# Patient Record
Sex: Male | Born: 1950 | Race: White | Hispanic: No | Marital: Married | Smoking: Former smoker
Health system: Southern US, Community
[De-identification: ages and names within clinical notes are randomized; demographics above are authoritative.]

## PROBLEM LIST (undated history)

## (undated) DIAGNOSIS — I119 Hypertensive heart disease without heart failure: Secondary | ICD-10-CM

## (undated) DIAGNOSIS — E785 Hyperlipidemia, unspecified: Secondary | ICD-10-CM

## (undated) DIAGNOSIS — F411 Generalized anxiety disorder: Secondary | ICD-10-CM

## (undated) DIAGNOSIS — Z820 Family history of epilepsy and other diseases of the nervous system: Secondary | ICD-10-CM

## (undated) DIAGNOSIS — F0390 Unspecified dementia without behavioral disturbance: Secondary | ICD-10-CM

## (undated) DIAGNOSIS — R55 Syncope and collapse: Secondary | ICD-10-CM

## (undated) DIAGNOSIS — J449 Chronic obstructive pulmonary disease, unspecified: Secondary | ICD-10-CM

## (undated) DIAGNOSIS — I219 Acute myocardial infarction, unspecified: Secondary | ICD-10-CM

## (undated) DIAGNOSIS — M129 Arthropathy, unspecified: Secondary | ICD-10-CM

## (undated) DIAGNOSIS — J45909 Unspecified asthma, uncomplicated: Secondary | ICD-10-CM

## (undated) DIAGNOSIS — I2510 Atherosclerotic heart disease of native coronary artery without angina pectoris: Secondary | ICD-10-CM

## (undated) DIAGNOSIS — N400 Enlarged prostate without lower urinary tract symptoms: Secondary | ICD-10-CM

## (undated) DIAGNOSIS — K449 Diaphragmatic hernia without obstruction or gangrene: Secondary | ICD-10-CM

## (undated) DIAGNOSIS — R0600 Dyspnea, unspecified: Secondary | ICD-10-CM

## (undated) DIAGNOSIS — F32A Depression, unspecified: Secondary | ICD-10-CM

## (undated) DIAGNOSIS — D126 Benign neoplasm of colon, unspecified: Secondary | ICD-10-CM

## (undated) DIAGNOSIS — K5790 Diverticulosis of intestine, part unspecified, without perforation or abscess without bleeding: Secondary | ICD-10-CM

## (undated) DIAGNOSIS — R413 Other amnesia: Secondary | ICD-10-CM

## (undated) DIAGNOSIS — K8020 Calculus of gallbladder without cholecystitis without obstruction: Secondary | ICD-10-CM

## (undated) DIAGNOSIS — K222 Esophageal obstruction: Secondary | ICD-10-CM

## (undated) HISTORY — DX: Other amnesia: R41.3

## (undated) HISTORY — DX: Family history of epilepsy and other diseases of the nervous system: Z82.0

## (undated) HISTORY — DX: Benign prostatic hyperplasia without lower urinary tract symptoms: N40.0

## (undated) HISTORY — DX: Hyperlipidemia, unspecified: E78.5

## (undated) HISTORY — PX: ANGIOPLASTY: SHX39

## (undated) HISTORY — DX: Unspecified asthma, uncomplicated: J45.909

## (undated) HISTORY — DX: Atherosclerotic heart disease of native coronary artery without angina pectoris: I25.10

## (undated) HISTORY — DX: Diaphragmatic hernia without obstruction or gangrene: K44.9

## (undated) HISTORY — DX: Hypertensive heart disease without heart failure: I11.9

## (undated) HISTORY — DX: Diverticulosis of intestine, part unspecified, without perforation or abscess without bleeding: K57.90

## (undated) HISTORY — DX: Arthropathy, unspecified: M12.9

## (undated) HISTORY — PX: ESOPHAGOGASTRODUODENOSCOPY: SHX1529

## (undated) HISTORY — PX: COLONOSCOPY: SHX174

## (undated) HISTORY — DX: Generalized anxiety disorder: F41.1

## (undated) HISTORY — DX: Syncope and collapse: R55

## (undated) HISTORY — PX: ROTATOR CUFF REPAIR: SHX139

## (undated) HISTORY — DX: Benign neoplasm of colon, unspecified: D12.6

## (undated) HISTORY — DX: Esophageal obstruction: K22.2

## (undated) HISTORY — DX: Calculus of gallbladder without cholecystitis without obstruction: K80.20

## (undated) HISTORY — DX: Chronic obstructive pulmonary disease, unspecified: J44.9

---
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
## 1999-03-19 ENCOUNTER — Encounter: Payer: Self-pay | Admitting: Endocrinology

## 1999-03-19 ENCOUNTER — Ambulatory Visit (HOSPITAL_COMMUNITY): Admission: RE | Admit: 1999-03-19 | Discharge: 1999-03-19 | Payer: Self-pay | Admitting: Endocrinology

## 1999-06-02 ENCOUNTER — Encounter (INDEPENDENT_AMBULATORY_CARE_PROVIDER_SITE_OTHER): Payer: Self-pay | Admitting: Specialist

## 1999-06-02 ENCOUNTER — Ambulatory Visit (HOSPITAL_COMMUNITY): Admission: RE | Admit: 1999-06-02 | Discharge: 1999-06-02 | Payer: Self-pay | Admitting: *Deleted

## 2001-07-21 ENCOUNTER — Ambulatory Visit (HOSPITAL_BASED_OUTPATIENT_CLINIC_OR_DEPARTMENT_OTHER): Admission: RE | Admit: 2001-07-21 | Discharge: 2001-07-22 | Payer: Self-pay | Admitting: Orthopedic Surgery

## 2003-01-22 HISTORY — PX: ROTATOR CUFF REPAIR: SHX139

## 2003-01-25 ENCOUNTER — Ambulatory Visit (HOSPITAL_BASED_OUTPATIENT_CLINIC_OR_DEPARTMENT_OTHER): Admission: RE | Admit: 2003-01-25 | Discharge: 2003-01-25 | Payer: Self-pay | Admitting: Orthopedic Surgery

## 2003-11-24 HISTORY — PX: CORONARY ARTERY BYPASS GRAFT: SHX141

## 2004-07-22 ENCOUNTER — Inpatient Hospital Stay (HOSPITAL_COMMUNITY): Admission: EM | Admit: 2004-07-22 | Discharge: 2004-07-28 | Payer: Self-pay | Admitting: Emergency Medicine

## 2004-08-22 HISTORY — PX: CARDIAC CATHETERIZATION: SHX172

## 2004-08-25 ENCOUNTER — Encounter
Admission: RE | Admit: 2004-08-25 | Discharge: 2004-08-25 | Payer: Self-pay | Admitting: Thoracic Surgery (Cardiothoracic Vascular Surgery)

## 2004-09-09 ENCOUNTER — Encounter (HOSPITAL_COMMUNITY): Admission: RE | Admit: 2004-09-09 | Discharge: 2004-12-08 | Payer: Self-pay | Admitting: Cardiovascular Disease

## 2004-11-24 ENCOUNTER — Inpatient Hospital Stay (HOSPITAL_COMMUNITY): Admission: EM | Admit: 2004-11-24 | Discharge: 2004-11-26 | Payer: Self-pay | Admitting: Emergency Medicine

## 2005-02-12 ENCOUNTER — Inpatient Hospital Stay (HOSPITAL_COMMUNITY): Admission: EM | Admit: 2005-02-12 | Discharge: 2005-02-13 | Payer: Self-pay | Admitting: Emergency Medicine

## 2006-07-23 ENCOUNTER — Inpatient Hospital Stay (HOSPITAL_COMMUNITY): Admission: EM | Admit: 2006-07-23 | Discharge: 2006-07-27 | Payer: Self-pay | Admitting: Emergency Medicine

## 2006-08-25 ENCOUNTER — Ambulatory Visit (HOSPITAL_BASED_OUTPATIENT_CLINIC_OR_DEPARTMENT_OTHER): Admission: RE | Admit: 2006-08-25 | Discharge: 2006-08-25 | Payer: Self-pay | Admitting: Orthopedic Surgery

## 2006-11-23 HISTORY — PX: CORONARY ARTERY BYPASS GRAFT: SHX141

## 2007-03-17 ENCOUNTER — Inpatient Hospital Stay (HOSPITAL_COMMUNITY): Admission: EM | Admit: 2007-03-17 | Discharge: 2007-03-18 | Payer: Self-pay | Admitting: Emergency Medicine

## 2007-03-18 ENCOUNTER — Ambulatory Visit: Payer: Self-pay | Admitting: Vascular Surgery

## 2007-08-30 ENCOUNTER — Ambulatory Visit: Payer: Self-pay | Admitting: Gastroenterology

## 2007-09-07 ENCOUNTER — Encounter: Payer: Self-pay | Admitting: Gastroenterology

## 2007-09-07 ENCOUNTER — Ambulatory Visit: Payer: Self-pay | Admitting: Gastroenterology

## 2007-09-07 DIAGNOSIS — K5732 Diverticulitis of large intestine without perforation or abscess without bleeding: Secondary | ICD-10-CM | POA: Insufficient documentation

## 2008-01-18 ENCOUNTER — Observation Stay (HOSPITAL_COMMUNITY): Admission: EM | Admit: 2008-01-18 | Discharge: 2008-01-19 | Payer: Self-pay | Admitting: Emergency Medicine

## 2008-02-02 DIAGNOSIS — R519 Headache, unspecified: Secondary | ICD-10-CM | POA: Insufficient documentation

## 2008-02-02 DIAGNOSIS — I2510 Atherosclerotic heart disease of native coronary artery without angina pectoris: Secondary | ICD-10-CM | POA: Insufficient documentation

## 2008-02-02 DIAGNOSIS — R51 Headache: Secondary | ICD-10-CM

## 2008-02-02 DIAGNOSIS — E7800 Pure hypercholesterolemia, unspecified: Secondary | ICD-10-CM | POA: Insufficient documentation

## 2008-02-02 DIAGNOSIS — J209 Acute bronchitis, unspecified: Secondary | ICD-10-CM

## 2008-02-02 DIAGNOSIS — F411 Generalized anxiety disorder: Secondary | ICD-10-CM | POA: Insufficient documentation

## 2008-12-15 ENCOUNTER — Observation Stay (HOSPITAL_COMMUNITY): Admission: EM | Admit: 2008-12-15 | Discharge: 2008-12-17 | Payer: Self-pay | Admitting: Emergency Medicine

## 2008-12-17 HISTORY — PX: CARDIAC CATHETERIZATION: SHX172

## 2009-02-15 ENCOUNTER — Observation Stay (HOSPITAL_COMMUNITY): Admission: EM | Admit: 2009-02-15 | Discharge: 2009-02-16 | Payer: Self-pay | Admitting: Emergency Medicine

## 2009-02-19 ENCOUNTER — Encounter: Payer: Self-pay | Admitting: Internal Medicine

## 2009-04-04 ENCOUNTER — Emergency Department (HOSPITAL_BASED_OUTPATIENT_CLINIC_OR_DEPARTMENT_OTHER): Admission: EM | Admit: 2009-04-04 | Discharge: 2009-04-04 | Payer: Self-pay | Admitting: Emergency Medicine

## 2009-04-04 ENCOUNTER — Ambulatory Visit: Payer: Self-pay | Admitting: Diagnostic Radiology

## 2009-04-05 ENCOUNTER — Emergency Department (HOSPITAL_COMMUNITY): Admission: EM | Admit: 2009-04-05 | Discharge: 2009-04-05 | Payer: Self-pay | Admitting: Emergency Medicine

## 2009-04-08 DIAGNOSIS — I10 Essential (primary) hypertension: Secondary | ICD-10-CM

## 2009-04-09 ENCOUNTER — Ambulatory Visit: Payer: Self-pay | Admitting: Internal Medicine

## 2009-04-09 DIAGNOSIS — R0602 Shortness of breath: Secondary | ICD-10-CM

## 2009-04-09 DIAGNOSIS — R05 Cough: Secondary | ICD-10-CM

## 2009-04-11 ENCOUNTER — Telehealth: Payer: Self-pay | Admitting: Internal Medicine

## 2009-04-18 ENCOUNTER — Ambulatory Visit (HOSPITAL_COMMUNITY): Admission: RE | Admit: 2009-04-18 | Discharge: 2009-04-18 | Payer: Self-pay | Admitting: Internal Medicine

## 2009-04-30 ENCOUNTER — Ambulatory Visit: Payer: Self-pay | Admitting: Internal Medicine

## 2009-04-30 DIAGNOSIS — J45909 Unspecified asthma, uncomplicated: Secondary | ICD-10-CM | POA: Insufficient documentation

## 2010-02-10 IMAGING — CR DG CHEST 2V
2 series · 2 of 2 positions shown · non-contrast
Comparison: 01/18/2008

CLINICAL DATA: Chest pain.

CHEST - 2 VIEW

[w chest pa]
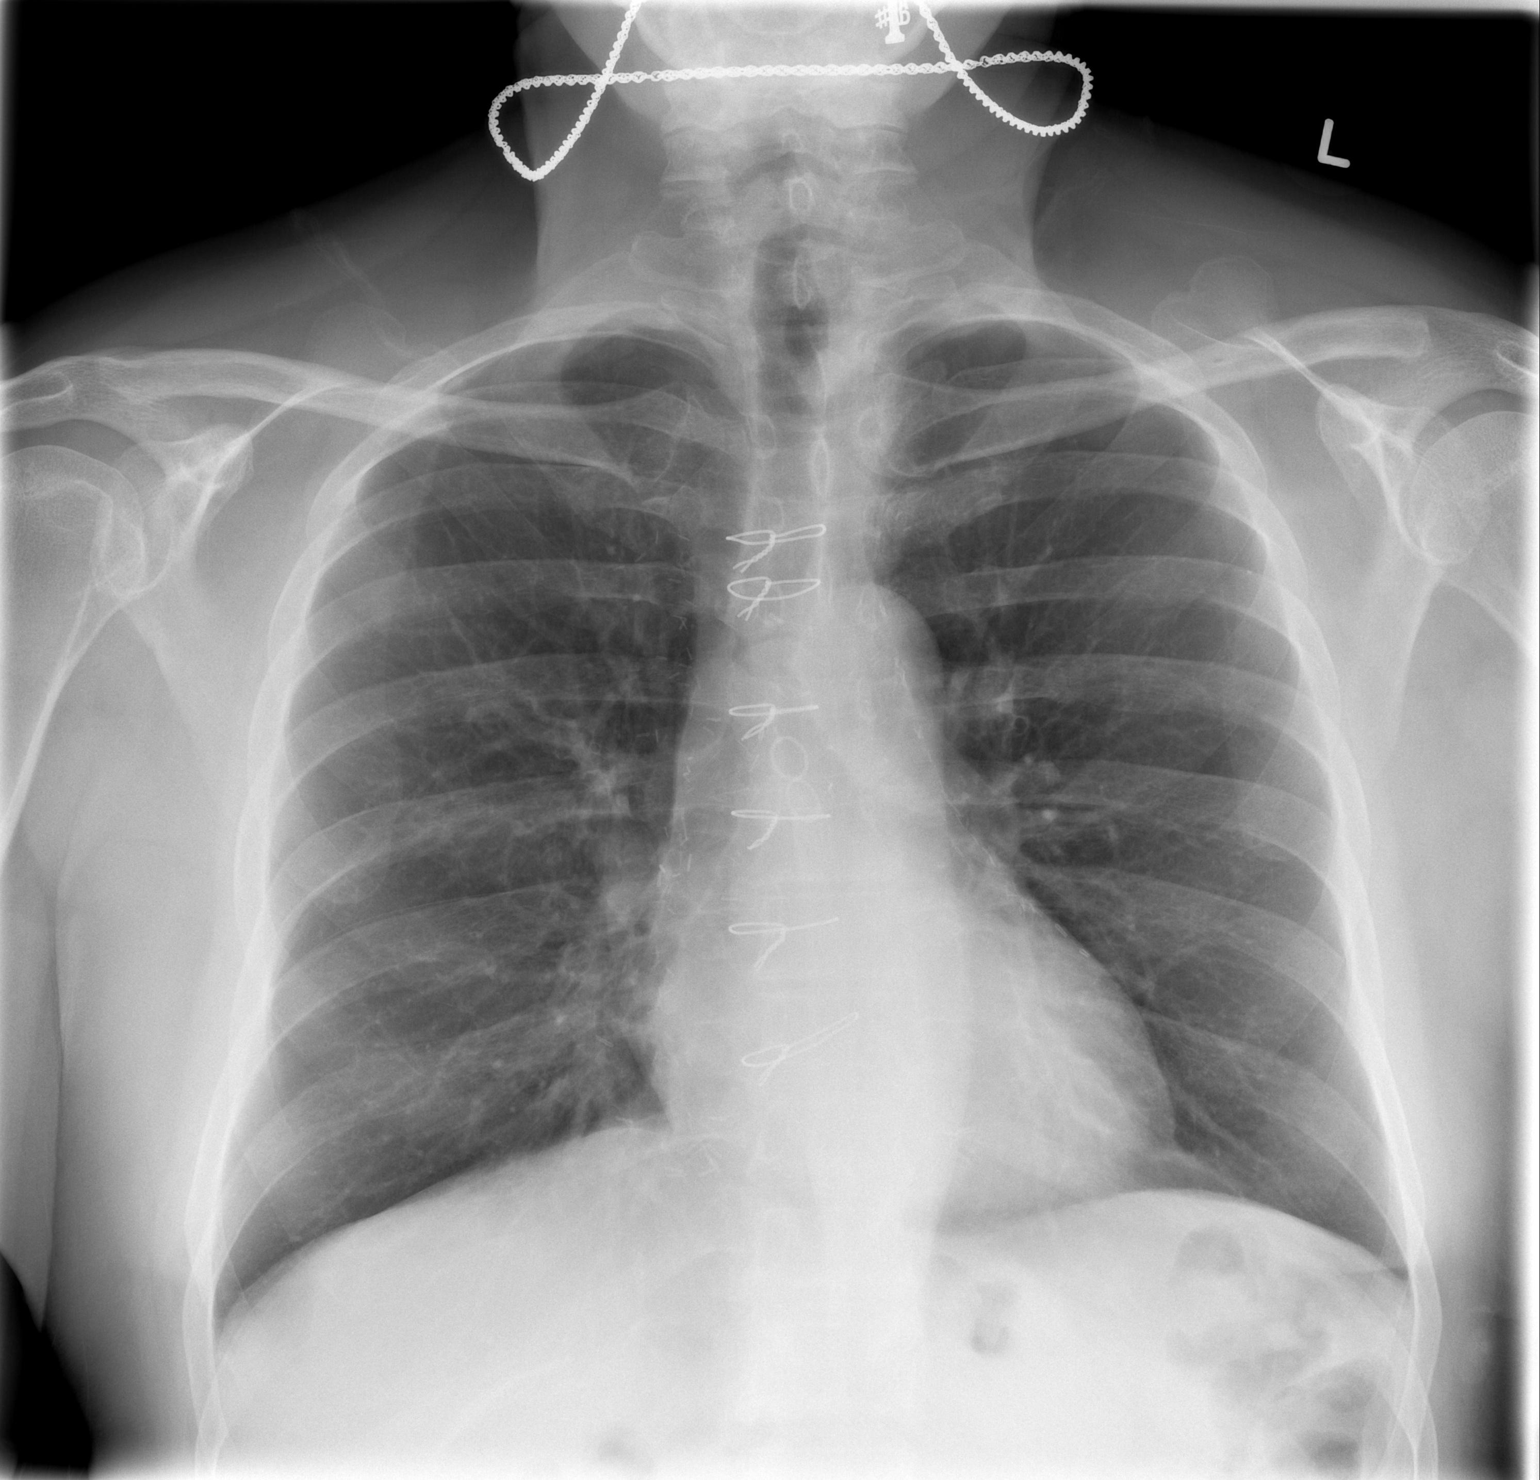

[w chest lat]
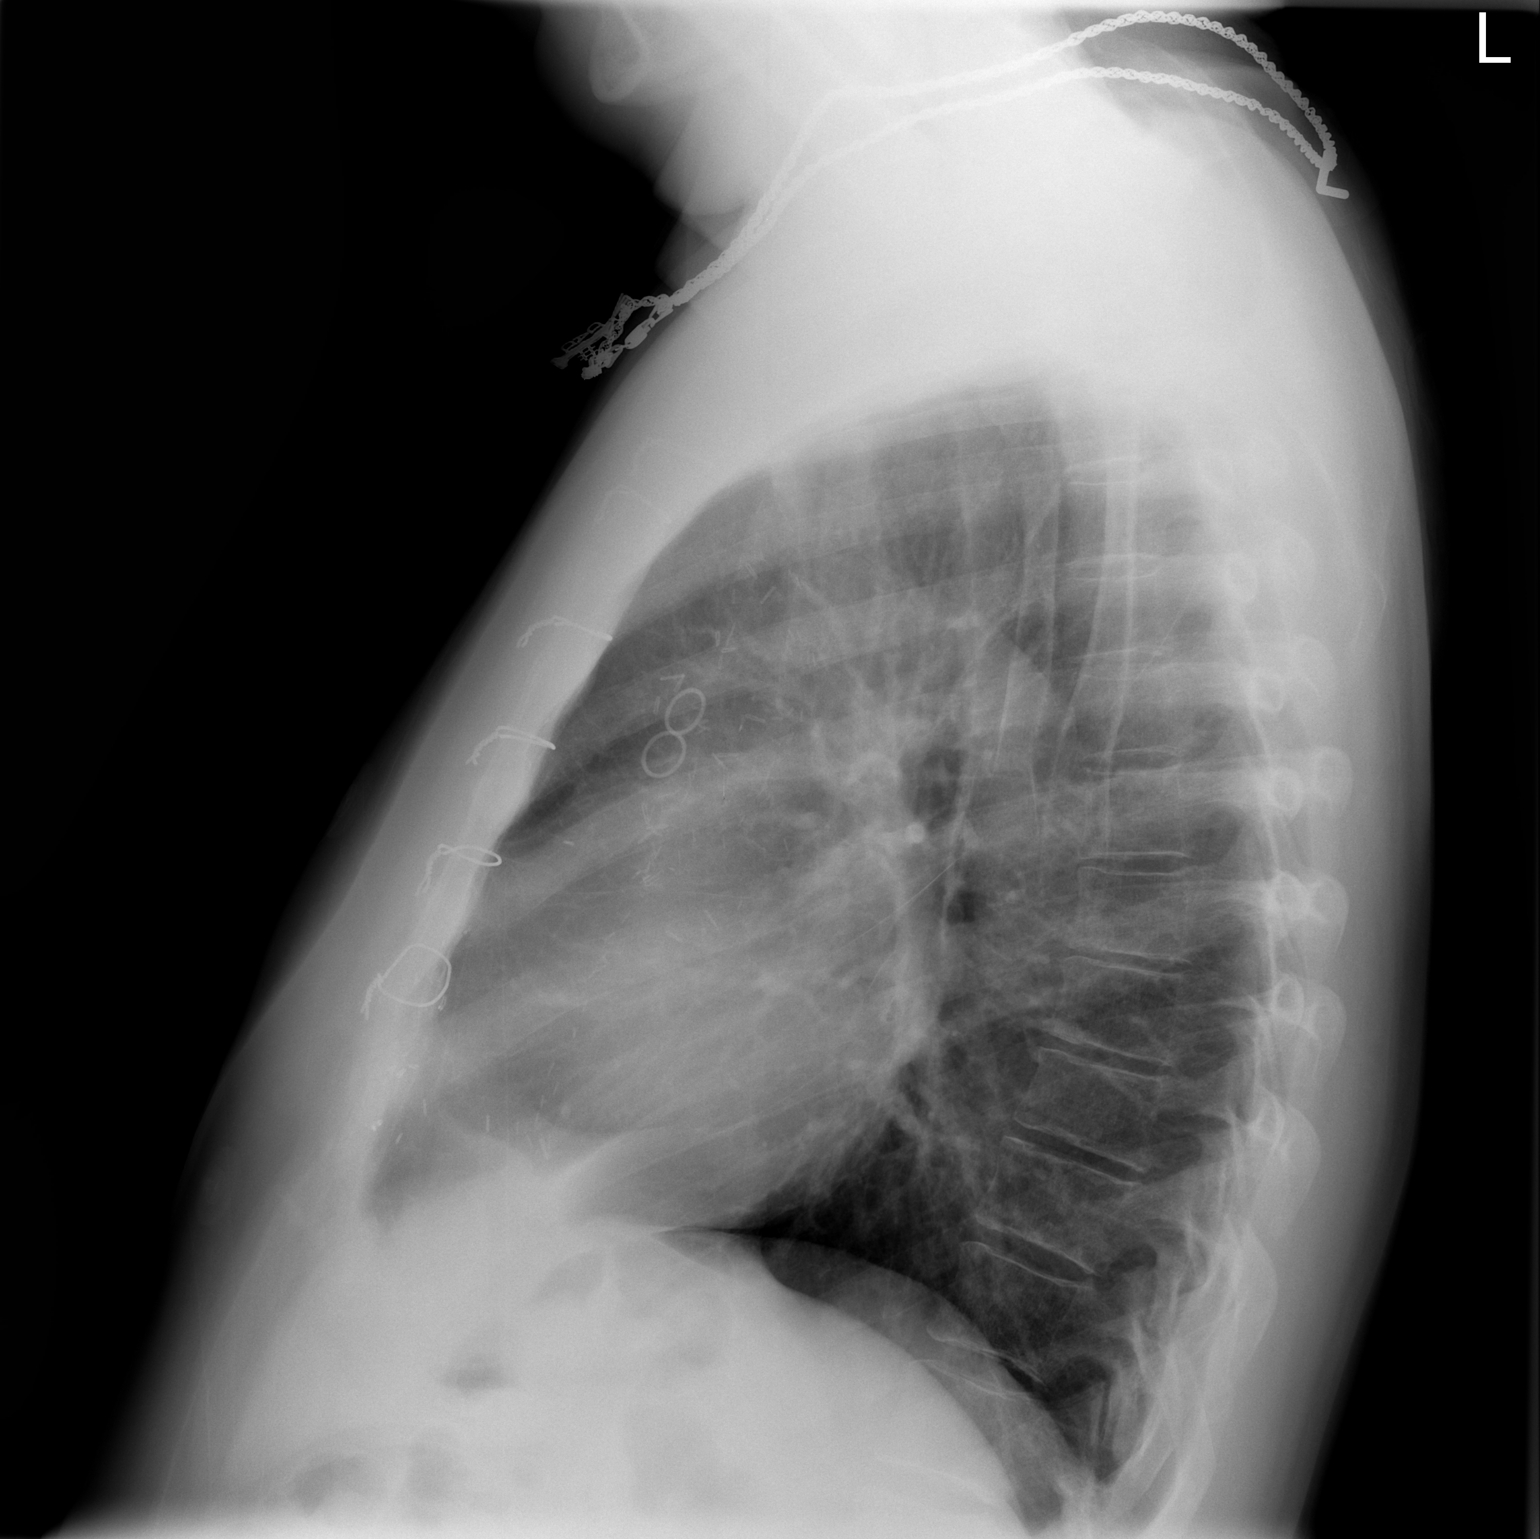

[2 of 2 positions shown; findings below may reference images not displayed]

FINDINGS: The cardiomediastinal silhouette is unremarkable.
Evidence of prior CABG again noted.
The lungs are clear.
There is no evidence of focal airspace disease, pleural effusions,
or pneumothorax.
No acute bony abnormalities are identified.
IMPRESSION: No evidence of acute cardiopulmonary disease.

## 2010-09-26 ENCOUNTER — Emergency Department (HOSPITAL_COMMUNITY): Admission: EM | Admit: 2010-09-26 | Discharge: 2010-09-26 | Payer: Self-pay | Admitting: Emergency Medicine

## 2011-02-03 LAB — TSH: TSH: 0.842 u[IU]/mL (ref 0.350–4.500)

## 2011-02-03 LAB — POCT CARDIAC MARKERS
CKMB, poc: <1 ng/mL — ABNORMAL LOW (ref 1.0–8.0)
CKMB, poc: <1 ng/mL — ABNORMAL LOW (ref 1.0–8.0)
Troponin i, poc: <0.05 ng/mL (ref 0.00–0.09)

## 2011-02-03 LAB — DIFFERENTIAL
Basophils Absolute: 0 10*3/uL (ref 0.0–0.1)
Basophils Relative: 0 % (ref 0–1)
Eosinophils Absolute: 0.1 10*3/uL (ref 0.0–0.7)
Monocytes Absolute: 0.5 10*3/uL (ref 0.1–1.0)
Monocytes Relative: 5 % (ref 3–12)
Neutrophils Relative %: 85 % — ABNORMAL HIGH (ref 43–77)

## 2011-02-03 LAB — COMPREHENSIVE METABOLIC PANEL
ALT: 30 U/L (ref 0–53)
Alkaline Phosphatase: 64 U/L (ref 39–117)
BUN: 8 mg/dL (ref 6–23)
Chloride: 107 mEq/L (ref 96–112)
Glucose, Bld: 122 mg/dL — ABNORMAL HIGH (ref 70–99)
Potassium: 4.3 mEq/L (ref 3.5–5.1)
Sodium: 140 mEq/L (ref 135–145)
Total Bilirubin: 1.4 mg/dL — ABNORMAL HIGH (ref 0.3–1.2)
Total Protein: 7.6 g/dL (ref 6.0–8.3)

## 2011-02-03 LAB — CK TOTAL AND CKMB (NOT AT ARMC): Relative Index: 1 (ref 0.0–2.5)

## 2011-02-03 LAB — MAGNESIUM: Magnesium: 2.2 mg/dL (ref 1.5–2.5)

## 2011-02-03 LAB — TROPONIN I: Troponin I: <0.01 ng/mL (ref 0.00–0.06)

## 2011-02-03 LAB — CBC
HCT: 46.2 % (ref 39.0–52.0)
Hemoglobin: 16.2 g/dL (ref 13.0–17.0)
MCV: 91.5 fL (ref 78.0–100.0)
RBC: 5.05 MIL/uL (ref 4.22–5.81)
RDW: 13.6 % (ref 11.5–15.5)
WBC: 9.4 10*3/uL (ref 4.0–10.5)

## 2011-02-03 LAB — LIPASE, BLOOD: Lipase: 34 U/L (ref 11–59)

## 2011-02-03 LAB — SEDIMENTATION RATE: Sed Rate: 5 mm/hr (ref 0–16)

## 2011-02-03 LAB — AMYLASE: Amylase: 106 U/L — ABNORMAL HIGH (ref 0–105)

## 2011-03-03 LAB — CBC
HCT: 39.8 % (ref 39.0–52.0)
Hemoglobin: 13.9 g/dL (ref 13.0–17.0)
MCHC: 34.9 g/dL (ref 30.0–36.0)
MCHC: 33.8 g/dL (ref 30.0–36.0)
MCV: 92.7 fL (ref 78.0–100.0)
MCV: 93.7 fL (ref 78.0–100.0)
Platelets: 221 10*3/uL (ref 150–400)
RBC: 4.29 MIL/uL (ref 4.22–5.81)
RDW: 12.8 % (ref 11.5–15.5)

## 2011-03-03 LAB — DIFFERENTIAL
Basophils Absolute: 0 10*3/uL (ref 0.0–0.1)
Basophils Absolute: 0 10*3/uL (ref 0.0–0.1)
Basophils Relative: 0 % (ref 0–1)
Basophils Relative: 1 % (ref 0–1)
Eosinophils Absolute: 0.3 10*3/uL (ref 0.0–0.7)
Eosinophils Absolute: 0.5 10*3/uL (ref 0.0–0.7)
Monocytes Absolute: 0.7 10*3/uL (ref 0.1–1.0)
Monocytes Relative: 9 % (ref 3–12)
Neutro Abs: 6.3 10*3/uL (ref 1.7–7.7)
Neutro Abs: 5.8 10*3/uL (ref 1.7–7.7)
Neutrophils Relative %: 75 % (ref 43–77)
Neutrophils Relative %: 70 % (ref 43–77)

## 2011-03-03 LAB — COMPREHENSIVE METABOLIC PANEL
ALT: 23 U/L (ref 0–53)
Albumin: 3.4 g/dL — ABNORMAL LOW (ref 3.5–5.2)
Alkaline Phosphatase: 54 U/L (ref 39–117)
BUN: 10 mg/dL (ref 6–23)
Chloride: 109 mEq/L (ref 96–112)
Glucose, Bld: 108 mg/dL — ABNORMAL HIGH (ref 70–99)
Potassium: 3.9 mEq/L (ref 3.5–5.1)
Sodium: 138 mEq/L (ref 135–145)
Total Bilirubin: 1.1 mg/dL (ref 0.3–1.2)
Total Protein: 6.1 g/dL (ref 6.0–8.3)

## 2011-03-03 LAB — POCT CARDIAC MARKERS
CKMB, poc: 2.8 ng/mL (ref 1.0–8.0)
Myoglobin, poc: 173 ng/mL (ref 12–200)

## 2011-03-03 LAB — CK TOTAL AND CKMB (NOT AT ARMC): CK, MB: 4 ng/mL (ref 0.3–4.0)

## 2011-03-05 LAB — POCT CARDIAC MARKERS: Troponin i, poc: <0.05 ng/mL (ref 0.00–0.09)

## 2011-03-05 LAB — DIFFERENTIAL
Eosinophils Absolute: 0 10*3/uL (ref 0.0–0.7)
Eosinophils Relative: 0 % (ref 0–5)
Lymphs Abs: 0.9 10*3/uL (ref 0.7–4.0)
Monocytes Relative: 1 % — ABNORMAL LOW (ref 3–12)

## 2011-03-05 LAB — COMPREHENSIVE METABOLIC PANEL
AST: 20 U/L (ref 0–37)
BUN: 6 mg/dL (ref 6–23)
CO2: 26 mEq/L (ref 19–32)
Chloride: 103 mEq/L (ref 96–112)
Creatinine, Ser: 0.84 mg/dL (ref 0.4–1.5)
GFR calc non Af Amer: >60 mL/min (ref >60)
Glucose, Bld: 96 mg/dL (ref 70–99)
Total Bilirubin: 0.7 mg/dL (ref 0.3–1.2)

## 2011-03-05 LAB — CBC
HCT: 29.6 % — ABNORMAL LOW (ref 39.0–52.0)
HCT: 45.5 % (ref 39.0–52.0)
Hemoglobin: 15.4 g/dL (ref 13.0–17.0)
MCHC: 33.8 g/dL (ref 30.0–36.0)
Platelets: 114 10*3/uL — ABNORMAL LOW (ref 150–400)
Platelets: 224 10*3/uL (ref 150–400)
RBC: 3.18 MIL/uL — ABNORMAL LOW (ref 4.22–5.81)
RDW: 12.9 % (ref 11.5–15.5)
WBC: 13.9 10*3/uL — ABNORMAL HIGH (ref 4.0–10.5)

## 2011-03-05 LAB — TROPONIN I: Troponin I: 0.42 ng/mL — ABNORMAL HIGH (ref 0.00–0.06)

## 2011-03-05 LAB — HEMOGLOBIN A1C: Mean Plasma Glucose: 94 mg/dL

## 2011-03-05 LAB — BASIC METABOLIC PANEL
BUN: 9 mg/dL (ref 6–23)
Calcium: 8.6 mg/dL (ref 8.4–10.5)
Creatinine, Ser: 0.92 mg/dL (ref 0.4–1.5)
GFR calc non Af Amer: >60 mL/min (ref >60)
Glucose, Bld: 98 mg/dL (ref 70–99)
Potassium: 4.3 mEq/L (ref 3.5–5.1)

## 2011-03-05 LAB — CARDIAC PANEL(CRET KIN+CKTOT+MB+TROPI)
CK, MB: 1.8 ng/mL (ref 0.3–4.0)
Relative Index: 1.3 (ref 0.0–2.5)
Relative Index: 1.5 (ref 0.0–2.5)
Troponin I: <0.01 ng/mL (ref 0.00–0.06)
Troponin I: <0.01 ng/mL (ref 0.00–0.06)

## 2011-03-05 LAB — PROTIME-INR
INR: 1 (ref 0.00–1.49)
Prothrombin Time: 13.1 seconds (ref 11.6–15.2)

## 2011-03-05 LAB — CK TOTAL AND CKMB (NOT AT ARMC)
Relative Index: 3.5 — ABNORMAL HIGH (ref 0.0–2.5)
Total CK: 146 U/L (ref 7–232)

## 2011-03-05 LAB — TSH: TSH: 1.955 u[IU]/mL (ref 0.350–4.500)

## 2011-03-09 LAB — CBC
HCT: 41.4 % (ref 39.0–52.0)
Hemoglobin: 13.9 g/dL (ref 13.0–17.0)
MCHC: 33.6 g/dL (ref 30.0–36.0)
MCHC: 33.8 g/dL (ref 30.0–36.0)
MCV: 94.2 fL (ref 78.0–100.0)
Platelets: 215 10*3/uL (ref 150–400)
Platelets: 225 10*3/uL (ref 150–400)
RBC: 4.4 MIL/uL (ref 4.22–5.81)
RBC: 4.4 MIL/uL (ref 4.22–5.81)
RDW: 13.3 % (ref 11.5–15.5)
WBC: 5.8 10*3/uL (ref 4.0–10.5)

## 2011-03-09 LAB — POCT CARDIAC MARKERS
CKMB, poc: 2.4 ng/mL (ref 1.0–8.0)
Myoglobin, poc: 61.7 ng/mL (ref 12–200)
Troponin i, poc: <0.05 ng/mL (ref 0.00–0.09)

## 2011-03-09 LAB — BASIC METABOLIC PANEL
BUN: 10 mg/dL (ref 6–23)
BUN: 11 mg/dL (ref 6–23)
CO2: 26 mEq/L (ref 19–32)
Calcium: 8.7 mg/dL (ref 8.4–10.5)
Calcium: 9 mg/dL (ref 8.4–10.5)
Calcium: 9.1 mg/dL (ref 8.4–10.5)
Chloride: 106 mEq/L (ref 96–112)
Creatinine, Ser: 0.95 mg/dL (ref 0.4–1.5)
Creatinine, Ser: 0.97 mg/dL (ref 0.4–1.5)
GFR calc Af Amer: >60 mL/min (ref >60)
GFR calc Af Amer: >60 mL/min (ref >60)
GFR calc Af Amer: >60 mL/min (ref >60)
GFR calc non Af Amer: >60 mL/min (ref >60)
GFR calc non Af Amer: >60 mL/min (ref >60)
Glucose, Bld: 105 mg/dL — ABNORMAL HIGH (ref 70–99)
Glucose, Bld: 106 mg/dL — ABNORMAL HIGH (ref 70–99)
Potassium: 4.1 mEq/L (ref 3.5–5.1)
Sodium: 141 mEq/L (ref 135–145)

## 2011-03-09 LAB — PROTIME-INR
INR: 1 (ref 0.00–1.49)
Prothrombin Time: 13.6 seconds (ref 11.6–15.2)
Prothrombin Time: 14.4 seconds (ref 11.6–15.2)

## 2011-03-09 LAB — LIPID PANEL
LDL Cholesterol: 66 mg/dL (ref 0–99)
Total CHOL/HDL Ratio: 2.9 RATIO
Triglycerides: 40 mg/dL (ref <150)
VLDL: 8 mg/dL (ref 0–40)

## 2011-03-09 LAB — CARDIAC PANEL(CRET KIN+CKTOT+MB+TROPI): Relative Index: 2.1 (ref 0.0–2.5)

## 2011-03-09 LAB — TROPONIN I: Troponin I: <0.01 ng/mL (ref 0.00–0.06)

## 2011-03-09 LAB — CK TOTAL AND CKMB (NOT AT ARMC): Relative Index: 1.8 (ref 0.0–2.5)

## 2011-03-09 LAB — APTT
aPTT: 42 seconds — ABNORMAL HIGH (ref 24–37)
aPTT: 47 seconds — ABNORMAL HIGH (ref 24–37)

## 2011-04-07 NOTE — Assessment & Plan Note (Signed)
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 Juleen China for evaluation of abdominal pain, gas,  bloating, and bowel irregularities.   Jose Leonard has a long history of alternating diarrhea and  constipation, gas, bloating, but denies rectal bleeding at this time,  anorexia, or weight loss, or any upper GI or hepatobiliary complaints.  He had previous colonoscopy by Dr. Sabino Gasser in July of 2000, at which  time he had guaiac positive stools.  He apparently had ulcerations in  his ileum and had mild ileitis, but was not treated.  He also had  endoscopy, which showed Helicobacter gastritis, which was not treated.  The patient, in the past, has had acid reflux symptoms, but currently  denies any upper GI or hepatobiliary complaints.  In 2005, he thought he  had a bad episode of acid reflux and ended up having an MI and underwent  cardiac bypass surgery.  He is followed by Dr. Tresa Endo and is on Plavix  and aspirin.   He denies melena, hematochezia, or any specific food intolerances.  He  is on a very low-fat diet and controls his weight closely.  He does not  smoke or abuse NSAID or alcohol.  He denies right lower quadrant pain,  fever, chills, skin rashes, joint pains, oral stomatitis, or any  musculoskeletal problems.  He has not had any recent GI x-rays.   PAST MEDICAL HISTORY:  Besides his hypercholesterolemia, coronary artery  disease, remarkable for asthmatic bronchitis, history of anxiety  disorder, chronic headaches, and several rotator cuff surgeries.   MEDICATIONS:  1. Niaspan 1000 mg at bedtime.  2. Altace 5 mg a day.  3. Vytorin 10/40 at bedtime.  4. Metoprolol 25 mg at bedtime.  5. Isorbid  90 mg a day.  6. Plavix 75 mg a day.  7. Aspirin 81 mg a day.  8. Multivitamins daily.  9. Fish oil 2400 mg a day.  10.Ranexa 500 mg a day.  11.He also uses p.r.n. Advil inhalers.   In the past has had shortness of breath and a rash with PENICILLIN use.   FAMILY HISTORY:  Remarkable for his mother having colon cancer in her  mid 48s.  Remarkable for diabetes, atherosclerosis, and cardiac disease.   SOCIAL HISTORY:  The patient is married and lives with his wife with a  high school education and was in the [employer].  He works as a Dispensing optician.  Denies any smoking history.  Uses ethanol socially.   REVIEW OF SYSTEMS:  The patient does have chest pain with exertion, and  uses p.r.n. nitroglycerin.  He describes a dry, nonproductive cough,  various arthralgias, chronic insomnia, periodic cardiac palpitations,  chronic back pain, chronic fatigue, shortness of breath with too much  exertion, but says he can walk several blocks at a time.  He denies any  current neurological, psychiatric, urologic, or other endocrine  problems.  Review of systems is otherwise noncontributory.   EXAMINATION:  He is a healthy-appearing white male appearing his stated  age in no acute distress.  He is 5 feet 8-1/2 inches tall and weighs 188 pounds.  Blood pressure  126/72, pulse 76 and regular.  I could not appreciate stigmata of chronic liver disease or thyromegaly.  His chest had scattered wheezes in both lung fields, but no rales.  There were no areas of consolidation on percussion.  He seemed to be in a regular rhythm without significant murmurs,  gallops, or rubs.  There was no hepatosplenomegaly, abdominal masses, or tenderness.  Bowel  sounds were normal.  Inspection of the rectum was unremarkable, as was rectal exam.  There  were no rectal masses or tenderness.  There was formed stool present,  which was guaiac negative.  There was no peripheral edema, phlebitis, or  swollen joints.  His mental  status was normal.   ASSESSMENT:  1. Chronic irritable bowel syndrome with a vague history of possible      ileitis per previous colonoscopy.  This is somewhat unclear since      the patient has no symptoms suggestive of Crohn's disease, and      never was treated for such.  2. Strong family history of colon carcinoma at an early age.  3. Hypertensive cardiovascular disease with hyperlipidemia and      coronary artery bypass surgery.  4. Chronic anticoagulation with Plavix and aspirin.  5. Long history of asthmatic bronchitis.  6. Multiple functional complaints, including chronic insomnia,      fatigue, arthralgias, et Karie Soda.  He does have a history of      anxiety syndrome.   RECOMMENDATIONS:  1. Outpatient colonoscopy at his convenience.  2. Will hold aspirin 7 days before his procedure and Plavix 5 days      before his procedure, unless advised otherwise by Drs. Kohut or      [employer].  The risks and benefits of this procedure on and off      anticoagulation have been explained to the patient in detail, and      he has agreed to proceed as planned.  He has related that in the      past he has held these medications for surgical procedures.  3. Continue all other medications per Drs. Kohut and [employer].  4. High fiber diet with daily Citrucel and liberal p.o. fluids.  5. Consider inflammatory bowel disease serologic markers depending on      clinical workup.     Vania Rea. Jarold Motto, MD, Caleen Essex, FAGA  Electronically Signed    DRP/MedQ  DD: 08/30/2007  DT: 08/30/2007  Job #: [id number]   cc:   Brooke Bonito, M.D.  Nicki Guadalajara, M.D.

## 2011-04-07 NOTE — Discharge Summary (Signed)
NAME:  Jose Leonard, Jose Leonard NO.:  [id number]   MEDICAL RECORD NO.:  [id number]          PATIENT TYPE:  INP   LOCATION:  2852                         FACILITY:  [facility]   PHYSICIAN:  Nicki Guadalajara, M.D.     DATE OF BIRTH:  04/25/1951   DATE OF ADMISSION:  12/14/2008  DATE OF DISCHARGE:                               DISCHARGE SUMMARY   DISCHARGE DIAGNOSES:  1. Chest pain, not coronary ischemia status post cardiac      catheterization, December 17, 2008 with grafts patent, atretic, and      occluded right internal mammary artery and an atretic left internal      mammary artery with medical treatment decision possibly      gastroesophageal reflux disease or musculoskeletal related chest      pain or increased stress.  His Imdur was increased and he was      placed on Protonix.  2. Coronary artery disease status post coronary bypass grafting,      August 2005.  3. History of enhanced external counterpulsation in the past.  4. Dyslipidemia.  5. Hypertension.  6. Borderline diabetes mellitus.  7. Bilateral rotator cuff repairs.  8. Gastroesophageal reflux disease.  9. Increased stress related to finances and deaths in the family.   DISCHARGE MEDICATIONS:  1. Imdur 90 mg a day.  2. Aspirin 81 mg a day.  3. Niaspan 1000 mg at bedtime.  4. Altace 5 mg a day.  5. Vytorin 10/40 one daily at night.  6. Metoprolol succinate 25 mg a day.  7. Plavix 75 mg a day.  8. Multivitamin daily.  9. Omega-3 fish oil capsules 2 twice per day.  10.Ranexa 500 mg a day.  11.Protonix 40 mg daily.   LABORATORY DATA:  His CK-MBs and troponins were all negative.  His  hemoglobin was 14.3, hematocrit 42.2, WBC 6.6, and platelets 225.  Sodium 140, potassium 4.4, chloride 108, CO2 27, BUN 11, creatinine  0.95, and glucose 105.   HOSPITAL COURSE:  Mr. Shackleton is a patient of Dr. Landry Dyke.  He came on  the evening of December 15, 2008 with complaints of chest pain and  shoulder pain.  He was seen  at 3:00 a.m. in the morning and seen by Dr.  Tresa Endo.  It was decided to admit him and he was set up for possible  cardiac catheterization.  His CK-MBs and troponins came back negative.  He was weaned off his nitroglycerin on December 16, 2008 and he was  changed Imdur at a higher dose from home.  He underwent cardiac  catheterization on December 17, 2008.  He had no stenosis that could be  intervened on and Dr. Tresa Endo decided to send him home for medical  treatment.   OTHER DISCHARGE INSTRUCTIONS:  He should do no strenuous activity,  lifting, pushing, pulling or extended walking x1 week.  He should call  us if he has any problems.  He should be out of work for 3-4 days.      Lezlie Octave, N.P.    ______________________________  Nicki Guadalajara, M.D.    BB/MEDQ  D:  12/17/2008  T:  12/18/2008  Job:  [id number]   cc:   Brooke Bonito, M.D.

## 2011-04-07 NOTE — H&P (Signed)
NAME:  Jose Leonard, Jose Leonard NO.:  [id number]   MEDICAL RECORD NO.:  [id number]          PATIENT TYPE:  INP   LOCATION:  1829                         FACILITY:  [facility]   PHYSICIAN:  Nicki Guadalajara, M.D.     DATE OF BIRTH:  1951-11-13   DATE OF ADMISSION:  02/15/2009  DATE OF DISCHARGE:                              HISTORY & PHYSICAL   CHIEF COMPLAINT:  Left-sided chest pain.   HISTORY OF PRESENT ILLNESS:  A 60 year old white married male with a  history of bypass grafting in 2005 with LIMA to the LAD, a RIMA to the  RCA and vein graft to the diagonal and vein graft to the marginal.  He  has documented atresia both arterial grafts, unfortunately.  He has had  chronic angina and has benefitted from ECP therapy as well as Ranexa.  He is currently under a lot of stress.  He also has shoulder pain that  has been injected by Dr. Richardson Landry but did not help his symptomatology  very much.  In January of 2010 he presented with chest pain, underwent  cardiac catheterization and he had patent vein grafts and atretic  arterial conduits, LV function was normal.  He did have mid distal  inferior focal hypercontractility.   The patient continues with pain every day that he just ignores most of  the time.  Yesterday he had chest pain, then last night he had severe  chest pain.  He sat up most of the night, he was also very short of  breath, he had dyspnea on exertion but even at rest he was short of  breath.  He did take 3 nitroglycerin last evening without relief of pain  so his wife brought him to the emergency room today.  Here, now that he  has got nitro paste on, his pain is down to 1-2/10, he describes it as a  sharp and then dull pain, kind of sharp, dull, sharp, dull.  Now,  currently, it just feels bruised in the chest.   PAST MEDICAL HISTORY:  1. Coronary disease with history of bypass grafting.  2. Shoulder pain, as stated previously.  3. Dyslipidemia.  4. Hypertension.  5. Borderline diabetes mellitus.  6. Bilateral rotator cuff repairs.  7. Gastroesophageal reflux disease.  8. Increased stress related to finances and deaths in the family.   OUTPATIENT MEDICATIONS:  1. Imdur 60 mg daily.  2. Aspirin 81 daily.  3. Niaspan 1000 mg at hour of sleep.  4. Altace 5 mg daily.  5. Vytorin 10/40 daily.  6. Metoprolol succinate 25 mg daily.  7. Plavix 75 mg daily.  8. Multivitamin daily.  9. Omega 3 Fish Oil caps 2 twice per day.  10.Ranexa 500 mg daily.  11.Protonix 40 mg daily.   Here in the emergency room labs were stable.  Chest x-ray with no active  disease.  Currently stable electrocardiogram without acute changes.   FAMILY HISTORY:  Not significant to this admission.   SOCIAL HISTORY:  Married, 3 children, 3 grandchildren.  Does not use  tobacco.  No significant alcohol.  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   ALLERGIES:  PENICILLIN.   REVIEW OF SYSTEMS:  GENERAL:  He does have a cough, yellow-brown mucus,  which he feels is more sinus-related.  SKIN:  Negative for rashes.  MUSCULOSKELETAL:  Right leg numbness which is chronic.  NEURO:  No  syncope.  No lightheadedness.  GI:  No diarrhea, constipation or melena.  GU:  No hematuria or dysuria.  ENDOCRINE:  Borderline diabetic.  No  thyroid disease.  GU:  No hematuria or dysuria.   PHYSICAL EXAM:  Blood pressure 125/68, respiration 16, pulse 65,  temperature is slightly elevated at 99.6, oxygen saturation in room air  98%.  GENERAL:  An alert, oriented white male in no acute distress.  Glasses  in place.  HEENT:  Normocephalic, glasses in place.  Sclera clear.  NECK:  Supple.  No JVD, no bruits, no thyromegaly.  CHEST:  Clear to auscultation but does have chest wall tenderness to  palpation in the left anterior spaces.  HEART:  S1-S2, regular rate and rhythm, without obvious murmur, gallop,  rub or click.  ABDOMEN:  Obese, soft,  nontender, positive bowel sounds.  Do not palpate  liver, spleen or masses.  LOWER EXTREMITIES:  Without edema.  Pedal pulses are present.  SKIN:  Without rashes.  NEURO:  Alert and oriented x3.  Follows commands.  Moves all  extremities.  Affect pleasant, though frustration due to continued  episodic chest pain.   LABS:  Myoglobin 76, CK-MB less than 1, troponin I less than 0.05.  Sodium 134, potassium 4.2, BUN 6, creatinine 0.89, glucose 96.  LFTs  were normal.  INR is 1.  Chest x-ray no active disease.   IMPRESSION:  1. Recurrent chest pain, sounds more or appears more musculoskeletal      but with extreme shortness of breath, rule out pulmonary embolism,      rule out myocardial infarction.  2. Upper respiratory infection.  Will add Mucinex and cough syrup and      nasal spray.  3. Coronary disease with a history of bypass grafting with arterial      conduits atretic.   PLAN OF ACTION:  1. IV Toradol stat and CT angiogram to rule out PE and to evaluate      fractured ribs.  2. Admit to a telemetry bed for overnight obs.  3. I will go ahead and give him full-dose Lovenox tonight.  4. I have written for the Toradol around the clock and Vicodin p.r.n.      as well as morphine p.r.n. and nitroglycerin sublingual.   Dr. Tresa Endo saw the patient and assessed him with me.  We will monitor.  Hopefully he can be discharged in the morning and hopefully his symptoms  will be improved.      Darcella Gasman. Annie Paras, N.P.    ______________________________  Nicki Guadalajara, M.D.    LRI/MEDQ  D:  02/15/2009  T:  02/15/2009  Job:  [id number]

## 2011-04-07 NOTE — Cardiovascular Report (Signed)
NAME:  Jose Leonard, Jose Leonard NO.:  [id number]   MEDICAL RECORD NO.:  [id number]           PATIENT TYPE:   LOCATION:                                 FACILITY:   PHYSICIAN:  Nicki Guadalajara, M.D.     DATE OF BIRTH:  1951-11-19   DATE OF PROCEDURE:  DATE OF DISCHARGE:                            CARDIAC CATHETERIZATION   INDICATIONS:  Jose Leonard is a pleasant 60 year old gentleman who  has known coronary artery disease.  In August 2005, he underwent LIMA to  his LAD, RIMA graft to his RCA, vein graft to his diagonal, and a vein  graft to his marginal vessel.  He has been documented to have atresia of  both arterial conduits.  He has had chronic angina.  Recently, he has  been under increased stress.  He has noticed recurrent chest pain.  Ultimately, he was admitted to [facility] on December 17, 2008,  with recurrent chest pain.  His cardiac enzymes were negative.  Definitive repeat catheterization was recommended.   PROCEDURE:  After premedication with Versed 2 mg intravenously, the  patient was prepped and draped in the usual fashion.  His right femoral  artery was punctured anteriorly and a 5-French sheath was inserted.  Diagnostic cardiac catheterization was done utilizing 5-French Judkins  #4 left and right coronary catheters.  The right catheter was used for  selective angiography into the vein graft.  The native right coronary  artery was not able to be cannulated with the RCA catheter and since  this native RCA arose from the left cusp just inferior to the left main,  an AL-1 catheter was necessary for selective angiography into the right  coronary artery.  A LIMA catheter was used for selective angiography  into the right and left subclavian system.  Hemostasis was obtained by  direct manual pressure.  The patient tolerated the procedure well.   HEMODYNAMIC DATA:  Central aortic pressure is 109/55.  Left ventricular  pressure is 109/12.   ANGIOGRAPHIC DATA:  Left main coronary artery was angiographically  normal and bifurcated into the LAD and left circumflex system.   The LAD itself appeared free of significant disease.  The first diagonal  vessel was occluded.  There was improvement in prior native CAD from  previous catheterization.   The circumflex vessel had 40-50% proximal narrowing.  The OM-1 vessel  was not well visualized and this was bypassed with the vein graft.  The  distal circumflex gave rise to 2 additional marginal vessels and was  free of significant disease.   The right coronary artery arose anomalously off the left coronary cusp.  There was now smooth 50% narrowing which actually had improved from  previously.  The RCA supplied a PDA and posterolateral vessel.   The right internal mammary artery was totally occluded and was not  visualized from the right subclavian system.   The left internal mammary artery was atretic and did not reach the LAD.   Biplane cine left ventriculography revealed preserved global  contractility with an ejection fraction of at least 55%.  On the  RAO  projection, there was a subtle suggestion of minimal mid distal  hypocontractility.  On the LAO, injection contractility appeared normal.   IMPRESSION:  1. Native coronary artery disease with improvement in the prior native      left anterior descending anatomy from prior to bypass, but with      diffuse high-grade stenosis in the first diagonal branch.  2. 40-50% circumflex narrowing with diffuse narrowing proximally of      the first obtuse marginal artery vessel.  3. 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  4. Patent vein graft to the obtuse marginal one vessel of the left      circumflex coronary artery.  5. Patent vein graft to diagonal branch of the left anterior      descending.  6. Old atretic left internal mammary artery graft  which no longer      reaches the left anterior descending.  7. Occluded right internal mammary artery graft at its origin.   RECOMMENDATIONS:  Medical therapy.           ______________________________  Nicki Guadalajara, M.D.     TK/MEDQ  D:  12/17/2008  T:  12/18/2008  Job:  [id number]

## 2011-04-10 NOTE — Op Note (Signed)
NAME:  Jose Leonard, Jose Leonard                      ACCOUNT NO.:  [id number]   MEDICAL RECORD NO.:  [id number]                   PATIENT TYPE:  AMB   LOCATION:  DSC                                  FACILITY:  [facility]   PHYSICIAN:  Loreta Ave, M.D.              DATE OF BIRTH:  [birth year]   DATE OF PROCEDURE:  01/25/2003  DATE OF DISCHARGE:                                 OPERATIVE REPORT   PREOPERATIVE DIAGNOSIS:  Recurrent partial versus complete rotator cuff  tear, left shoulder, with recurrent impingement and calcification, AC  interval.   POSTOPERATIVE DIAGNOSIS:  Recurrent partial rotator cuff tear, left  shoulder, with recurrent impingement and calcification, AC interval.   PROCEDURE:  1. Left shoulder exam under anesthesia.  2. Arthroscopy.  3. Debridement of rotator cuff above and below.  4. Bursectomy.  5. Revision anterior acromioplasty.  6. Excision recurrent calcification, bony fragments, AC interval.   SURGEON:  Loreta Ave, M.D.   ASSISTANT:  Arlys John D. Petrarca, P.A.-C.   ANESTHESIA:  General.   ESTIMATED BLOOD LOSS:  Minimal.   SPECIMENS:  None.   CULTURES:  None.   COMPLICATIONS:  None.   DRESSINGS:  Soft, compressive with sling.   PROCEDURE IN DETAIL:  The patient was brought to the operating room and  placed on the operating table in supine position.  After adequate anesthesia  had been obtained, the left shoulder was examined.  Full motion, good  stability.  Patient placed in beachchair position on the shoulder positioner  and prepped and draped in the usual sterile fashion.  Three standard  arthroscopic portals, anterior, posterior, lateral.  Shoulder entered.  Distended and inspected.  Partial-thickness tearing, undersurface of the  cuff, but the previous repair was still intact.  Biceps tendon, biceps  anchor, labrum, articular cartilage, capsule and ligamentous structures all  intact.  The camera was then redirected subacromially.  More  significant  abrasive tearing on the top of the cuff, thoroughly debrided.  Recurrent  bursitis debrided.  Although he had a previous decompression, this did look  good except for a little bit of recurrent spurring on the front of the  acromion.  Created with revision acromioplasty and re-releasing of ligament  there.  AC interval was exposed.  Adequate excision distal clavicle, but  there was some calcification post-traumatic in that interval.  All of that  was resected, and the clavicle was brought back another few millimeters to  give more clearance.  Care was taken to stay lateral to the coracoclavicular  ligament.  Adequacy of decompression and thorough assessment of the cuff  viewing it from all portals.  Instruments and fluid were removed.  Portals  injected with Marcaine.  Portals closed with 4-0 nylon.  Sterile compressive  dressing applied.  Anesthesia reversed.  Brought to recovery room.  Tolerated surgery well, no complications.  Loreta Ave, M.D.    DFM/MEDQ  D:  01/26/2003  T:  01/26/2003  Job:  [id number]

## 2011-04-10 NOTE — Cardiovascular Report (Signed)
NAME:  Jose Leonard, Jose Leonard                      ACCOUNT NO.:  [id number]   MEDICAL RECORD NO.:  [id number]                   PATIENT TYPE:  INP   LOCATION:  2315                                 FACILITY:  [facility]   PHYSICIAN:  Darlin Priestly, M.D.             DATE OF BIRTH:  [birth year]   DATE OF PROCEDURE:  07/23/2004  DATE OF DISCHARGE:                              CARDIAC CATHETERIZATION   PROCEDURES PERFORMED:  1.  Left heart catheterization.  2.  Coronary angiography.  3.  Left ventriculogram.   ATTENDING:  Darlin Priestly, M.D.   COMPLICATIONS:  None.   INDICATIONS:  Mr. Himes is a 60 year old male patient of Dr. Juleen China who  has a history of asthma and BPH who began complaining of substernal chest  pain on July 22, 2004 while at work.  He was subsequently admitted where  he did rule in for non Q-wave infarction.  He is now referred for cardiac  catheterization to define his coronary anatomy.   DESCRIPTION OF OPERATION:  After giving informed written consent, patient  brought to the cardiac catheterization laboratory.  Right and left groin  shaved, prepped and draped in usual sterile fashion.  ECG monitor  established.  Using a modified Seldinger technique, a number 6-French  arterial sheath inserted in right femoral artery.  A 6-French diagnostic  catheter was then used to perform diagnostic angiography.   Left main is a large vessel with no significant disease.   The LAD is a large vessel which courses to the apex and gives rise to one  diagonal branch.  The LAD is noted to have 60% early mid stenosis after the  takeoff of the first diagonal.   The first diagonal is a large vessel which comes off at a greater than 90  degree angle, courses anteriorly, and then turns in a downward fashion.  There is noted to be a long 99% lesion in the early proximal portion of the  vessel.   The left circumflex is a large vessel coursing the AV groove and gives rise  to two  obtuse marginal branches.  The AV groove circumflex has a 40%  narrowing in its proximal portion.  The remainder of the AV circumflex has  no significant disease.   The first OM is a large vessel which bifurcates in its distal segment.  The  first OM appears to have a 60-70% ostial lesion.  There is 50% lesion in the  mid portion of the OM.  The lower branches are irregular, but have no high  grade stenosis.   The second OM is a small vessel with no significant disease.   The right coronary artery is a dominant vessel and appears to originate from  the left coronary cusp and shares a very close ostium with the left main.  This is nonselectively engaged with an AL2 catheter.  There appears to be a  70% proximal RCA lesion.  The remainder of the RCA, PDA, and posterolateral  branch have no significant disease.   Left ventriculogram reveals preserved EF of 50-55% with anterolateral  hypokinesis.   HEMODYNAMICS:  Systemic arterial pressure 93/55, LV systemic pressure 95/3,  LVEDP 11.   CONCLUSION:  1.  Significant three vessel coronary artery disease.  2.  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  3.  Normal left ventricular function with wall motion abnormalities above.                                               Darlin Priestly, M.D.    RHM/MEDQ  D:  07/23/2004  T:  07/24/2004  Job:  [id number]   cc:   Michaelyn Barter, M.D.   Brooke Bonito, M.D.  [address]  [place]  [place] [address]  Fax: [phone]

## 2011-04-10 NOTE — Discharge Summary (Signed)
NAME:  Jose Leonard, Jose Leonard NO.:  [id number]   MEDICAL RECORD NO.:  [id number]          PATIENT TYPE:  OBV   LOCATION:  4740                         FACILITY:  [facility]   PHYSICIAN:  Nicki Guadalajara, M.D.     DATE OF BIRTH:  [birth year]   DATE OF ADMISSION:  01/18/2008  DATE OF DISCHARGE:  01/19/2008                               DISCHARGE SUMMARY   Jose Leonard is a 60 year old male patient of Dr. Nicki Guadalajara who came  into the emergency room with complaints of chest pain that was constant.  He had a prior medical history of coronary bypass grafting in 2005 with  a LIMA to his LAD, a LIMA to his RCA, a SVG to his diagonal, SVG to his  OM.  He has had a cath since then.  His LIMA to his LAD was patent, his  LIMA to his RCA was patent, and his SVG to his diagonal one was patent.  His SVG to his OM was 40-50%.  He was admitted to the hospital and put  on subcu Lovenox.  He ruled out for an MI.  His CK-MBs and troponins  were negative.  It was decided that he could be discharged home.  It was  felt that his chest pain was musculoskeletal.  It hurt with movement of  his trunk and deep inspiration.  His other labs showed a hemoglobin of  14.2, hematocrit 41.7, platelets of 213 and WBC 7.3.  Total cholesterol  117, LDL 64, HDL 41, and triglycerides 60.  His sodium is 136, potassium  4.1, BUN 8, creatinine 1.01, and his glucose was 134.   DISCHARGE MEDICATIONS:  1. Ranexa 500 mg twice a day.  2. Niaspan 1000 mg at bedtime.  3. Vytorin 10/40 at bedtime.  4. Metoprolol 50 mg daily.  5. Amitriptyline __________ mg a day.  6. Plavix 75 mg a day.  7. Aspirin 81 mg a day.  8. Ramipril 5 mg a day.  9. Motrin 600 mg 3 times per day x7 days.  10.Pepcid 20 mg a day or Protonix 40 mg a day.  11.Advair 150 as needed p.r.n.   DISCHARGE DIAGNOSIS:  1. Chest pain, atypical for angina.  Diagnosis musculoskeletal versus      pericarditis.  2. Chronic angina.  3. Coronary artery disease  status post coronary artery bypass graft in      2005 with cardiac catheterization since      then.  4. Hyperlipidemia.  5. Hypertension.      Lezlie Octave, N.P.    ______________________________  Nicki Guadalajara, M.D.    BB/MEDQ  D:  02/08/2008  T:  02/09/2008  Job:  [id number]   cc:   Brooke Bonito, M.D.

## 2011-04-10 NOTE — Cardiovascular Report (Signed)
NAME:  Jose Leonard, Jose Leonard NO.:  [id number]   MEDICAL RECORD NO.:  [id number]          PATIENT TYPE:  INP   LOCATION:  2038                         FACILITY:  [facility]   PHYSICIAN:  Nicki Guadalajara, M.D.     DATE OF BIRTH:  [birth year]   DATE OF PROCEDURE:  11/25/2004  DATE OF DISCHARGE:                              CARDIAC CATHETERIZATION   INDICATIONS:  [name] is a very pleasant 60 year old  gentleman who had developed new onset unstable angina/non-Q wave MI in  August 2005.  Cardiac catheterization was done by Dr. Jenne Campus on July 23, 2004 which showed 60% LAD lesion after the takeoff of the first diagonal,  with a subtotal 99% diagonal stenosis in a large diagonal vessel.  The  circumflex vessel had 40% narrowing in its proximal portion, and there  appeared to be a 60-70% ostial lesion at the takeoff of the OM1 vessel.  The  right coronary artery seemed to arise from the left coronary cusp and had  70% proximal lesion.  Because of the patient's multivessel CAD, Dr. Jenne Campus  recommended CBG revascularization, and the patient underwent bypass surgery  on July 24, 2004 by Dr. Dorris Fetch.  He had a LIMA insert to his LAD, a  RIMA inserted to his right coronary artery, a vein graft to his first  diagonal, and a vein graft to his first obtuse marginal vessel.  He did well  postoperatively.  The patient has been doing well and has been without  recurrent symptoms.  However, for three days prior to current admission, he  has developed recurrent episodes of somewhat different left chest pain which  seems to have been precipitated by palpitations.  However, this would then  be associated with tightness up to his shoulder and down his arm.  This  ultimately led to his hospitalization last evening when he was seen by Dr.  Waldon Reining and admitted with possible unstable angina.  Of note, he was treated  with IV nitro and heparin.  Chest CT was negative for pulmonary  embolism.  Troponins were mildly positive at 0.8, 0.11, 0.34, and 0.17.  Definitive  diagnostic heart catheterization was therefore recommended.   PROCEDURE:  After premedication with Valium 5 mg intravenously, the patient  was prepped and draped in the usual fashion.  His right femoral artery was  punctured anteriorly, and a 5 French sheath was inserted.  Diagnostic  catheterization was done utilizing 5 [place] Judkins for left coronary  catheter.  An AL-1 was used for selective angiography into the right  coronary artery, which arose from the left coronary cusp.  A 5 French right  catheter was used for selective angiography into both vein grafts, as well  as selective angiography into the left internal mammary artery and right  internal mammary artery systems.  A pigtail catheter was used for biplane  cine left ventriculography.  Distal aortography was also done, since this  was not done at the previous catheterization in light of his concomitant CAD  to make sure there was no significant aortoiliac disease.  During the  procedure, the  patient received several doses of intracoronary nitroglycerin  down the vein grafts as well as the right internal mammary artery.  Hemostasis was obtained by direct manual pressure.  The patient tolerated  the procedure well.   HEMODYNAMIC DATA:  Central aortic pressure was 102/75.  Left ventricular  pressure was 102/5, __________ 14.   ANGIOGRAPHIC DATA:  Left main coronary was angiographically normal and  bifurcated into an LAD and left circumflex system.   The LAD gave rise to a proximal diagonal vessel that had a 90-degree takeoff  and then was subtotally occluded before the insertion of the vein graft.  After the diagonal takeoff, the LAD gave rise to a prominent septal  perforating artery, and then was narrowed to 20-30%.  This actually was  improved when compared to his prior study which was interpreted as 60% in  the LAD.   The circumflex  vessel had a bifurcation-like stenosis of 50-60% in the  origin of the first marginal vessel, with 50% narrowing at the ostium of the  first marginal vessel.  A flush and fill phenomenon was seen due to the  competitive filling via the vein graft supplying this vessel.  The remainder  of the circumflex and its branches were angiographically normal.   The right coronary artery arose from the left coronary cusp.  There appeared  to be smooth narrowing of no greater than 70% in its proximal segment, which  was somewhat diffuse.  This has not changed in significance in comparison to  the prior study.  The distal right coronary artery was of moderate caliber  and supplied a PDA and posterolateral system.   The saphenous vein graft supplying the marginal vessel had mild  irregularities, with narrowings of 20% in the mid and distal body.   The saphenous vein graft supplying the diagonal vessel was widely patent and  again had mild irregularities.  IC nitroglycerin again suggested smooth  narrowing of approximately 20-30% in the distal third portion of the graft.   The left internal mammary artery was atretic.  This seemed to have two small  branches arising in its midsegment.  Filling of the LAD was not visualized  from the LIMA injection due to its competitive filling.   The right internal mammary artery was also a small caliber atretic graft.  Filling of the distal RCA was not significantly seen.  IC nitroglycerin  improved flow somewhat, but again distal RCA filling was not significantly  visualized from this atretic RIMA graft.   Biplane cine ventriculography revealed preserved global contractility  without focal segmental wall motion abnormalities.   Distal aortography did not demonstrate any renal artery stenosis.  There was  no significant aortoiliac disease.   IMPRESSION:  1.  Normal left ventricular function. 2.  Native coronary artery disease, with subtotal first diagonal  stenosis of      the left anterior descending, with improvement in the previously noted      60% mid-left anterior descending stenosis to now approximately 30%; 50-      60% narrowing just prior to the first obtuse marginal takeoff, with 30%      narrowing in the first obtuse marginal vessel.  3.  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  4.  Patent vein graft supplying the first obtuse marginal vessel, with mild      irregularity of 20% in its midsegment.  5.  Patent vein graft supplying the diagonal vessel, with 20% narrowing in      its midsegment, not significantly improved with intracoronary      nitroglycerin administration.  6.  Atretic left internal mammary artery which no longer supplies the left      anterior descending.  7.  Atretic right coronary artery in which the distal right coronary artery      is not significantly visualized.  8.  No significant aortoiliac disease.   RECOMMENDATIONS:  Medical therapy.       TK/MEDQ  D:  11/25/2004  T:  11/25/2004  Job:  [id number]   cc:   Nicki Guadalajara, M.D.  [phone] [address], [address]  [place], [place] [address]  Fax: [phone]   Michaelyn Barter, M.D.   Brooke Bonito, M.D.  [address]  [place]  [place] [address]  Fax: [phone]   Salvatore Decent. Dorris Fetch, M.D.  [address]  [place]  [place] [address]

## 2011-04-10 NOTE — Op Note (Signed)
NAME:  Jose Leonard, Jose Leonard NO.:  [id number]   MEDICAL RECORD NO.:  [id number]          PATIENT TYPE:  AMB   LOCATION:  DSC                          FACILITY:  [facility]   PHYSICIAN:  Loreta Ave, M.D. DATE OF BIRTH:  Mar 07, 1951   DATE OF PROCEDURE:  08/25/2006  DATE OF DISCHARGE:                                 OPERATIVE REPORT   PREOPERATIVE DIAGNOSES:  1. Right shoulder subacromial impingement, partial tearing of rotator      cuff.  2. Degenerative joint disease, acromioclavicular joint, with osteolysis,      distal clavicle.  3. Triggering long finger, A1 pulley, right hand.   POSTOPERATIVE DIAGNOSES:  Right shoulder:  1. Extensive impingement, partial-thickness tearing, rotator cuff above      and below, no full-thickness tears.  2. Extensive tearing, inferior labrum.  3. Partial tearing, long head, biceps tendon.  4. 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  5. Triggering A1 pulley, long finger, right hand.   PROCEDURES:  1. Right shoulder examination under anesthesia, arthroscopy, with      debridement of labrum, rotator cuff, biceps tendon.  2. Acromioplasty, coracoacromial ligament release.  3. Excision of distal clavicle.  4. Release of A1 pulley, long finger, right hand.   SURGEON:  Loreta Ave, M.D.   ASSISTANT:  Genene Churn. Denton Meek.   ANESTHESIA:  General.   BLOOD LOSS:  Minimal.   TOURNIQUET TIME:  30 minutes, utilizing an Esmarch, right hand.   SPECIMENS:  None.   CULTURES:  None.   COMPLICATIONS:  None.   DRESSING:  Sterile compressive on the hand, and a sling for the shoulder.   DESCRIPTION OF PROCEDURES:  The patient was brought to the operating room,  placed on the operating table in supine position.  After adequate anesthesia  had been obtained, right shoulder examined.  Full motion and good stability.  Placed in a beach-chair position and prepped and draped  in the usual sterile  fashion.  Three portals, anterior, posterior and lateral.  Shoulder entered  with a blunt obturator, distended and inspected.  Marked complex tearing,  inferior labrum, debrided.  Articular cartilage otherwise looked good.  Some  attritional partial tearing, long head biceps tendon, debrided.  Still a  good anchor off the glenoid, and sufficient structure to not remove the  tendon.  Undersurface tearing, crescent region of the supraspinatus,  debrided.  No full-thickness tears.  Cannula redirected subacromially.  Obvious impingement, type 2 acromion, abrasive tearing on the top of the  cuff, no full-thickness tears.  Grade 4 changes with marked peri-articular  spurring, AC joint.  Cuff debrided, bursa resected.  Acromioplasty to a type  1 acromion with shaver and high-speed bur.  CA ligament released with  cautery.  Distal clavicle exposed and a lateral centimeter and peri-  articular spurs removed with shaver and high-speed bur.  Adequacy of  decompression and clavicle excision confirmed, viewing from all portals.  Instruments and fluid removed.  Portals closed with nylon.  Dressing  applied.  We then did  a formal prep and drape of the lower aspect of the  right arm.  Exsanguinated with elevation of an Esmarch.  An Esmarch used as  a tourniquet on the forearm.  Small transverse incision over the A1 pulley,  long finger.  Skin and subcutaneous tissue divided.  Neurovascular bundles  identified and protected.  A1 pulley released in its entirety under direct  visualization to obliterate all triggering.  Moderate amount of tenosynovial  fluid and tenosynovial tissue debrided.  Tendons intact.  No triggering at  completion.  Wound irrigated.  Closed with nylon.  Sterile compressive  dressing applied.  New dressing applied to the shoulder.  Sling applied.  Anesthesia reversed.  Brought to the recovery room.  Tolerated surgery well.  No complications.      Loreta Ave, M.D.  Electronically Signed     DFM/MEDQ  D:  08/25/2006  T:  08/25/2006  Job:  [id number]

## 2011-04-10 NOTE — Discharge Summary (Signed)
NAME:  Jose Leonard, Jose Leonard NO.:  [id number]   MEDICAL RECORD NO.:  [id number]          PATIENT TYPE:  INP   LOCATION:  2038                         FACILITY:  [facility]   PHYSICIAN:  Nicki Guadalajara, M.D.     DATE OF BIRTH:  07-Dec-1950   DATE OF ADMISSION:  11/24/2004  DATE OF DISCHARGE:  11/26/2004                                 DISCHARGE SUMMARY   DISCHARGE DIAGNOSES:  1.  Unstable angina pectoris.  This admission, the patient had abnormal      troponins, status post catheterization, with no interventions by Dr.      Nicki Guadalajara.  2.  Known coronary artery disease, status post coronary artery bypass      grafting in September of 2005 with left internal mammary artery to the      left anterior descending, saphenous vein graft to the diagonal,      saphenous vein graft to the first obtuse marginal and right internal      mammary artery to the right coronary artery.  3.  Asthma.  4.  Benign prostatic hypertrophy.  5.  Crohn's disease, quiescent.  6.  Questionable infiltrate versus atelectasis of the left lung base.   DISCHARGE MEDICATIONS:  1.  Zocor 40 mg daily.  2.  Avelox 400 mg daily x9 days for a cumulative dose of 10 days.  3.  Aspirin 81 mg daily.  4.  Imdur 30 mg daily.  5.  Altace 5 mg daily.  6.  Lopressor 50 mg b.i.d.   DISCHARGE INSTRUCTIONS:  The patient was advised to stay on a low-fat, low-  cholesterol diet.  He was instructed not to drive and not to lift greater  than 5 pounds of weight, to avoid any strenuous activities, stay away from  work up until his next office visit with Dr. Tresa Endo; Dr. Tresa Endo will see the  patient on December 18, 2004 at 3 p.m.   HISTORY OF PRESENT ILLNESS AND HOSPITAL COURSE:  [age]-year-old  Caucasian gentleman admitted to [facility] with complaints of chest  pain.  He underwent coronary artery bypass grafting in September of this  past year and his course of recovery was uncomplicated.  He presented to the  emergency room on the afternoon of November 24, 2004 with complaint of chest  pain that persisted for 3 days, but this pain was different from that which  preceded his coronary artery bypass grafting.   He was admitted to the telemetry unit with cardiac enzymes that showed  abnormal troponin, first set showing troponin of 0.8, then the subsequent  blood draws revealed troponin of 0.11, 0.34 and then the last one 0.17, but  along with that, his CK and CK-MB were negative; his first CK was 68, second  59.   The patient was taken to the cardiac cath lab on November 25, 2004 and  underwent catheterization by Dr. Tresa Endo which revealed normal left  ventricular function, severe native coronary artery disease.  It showed some  irregularity of 20% in the mid-portion of the SVG to the first obtuse  marginal vessel, a patent  graft supplying the diagonal with 20% narrowing in  its mid-segment, also atretic LIMA which no longer supplied the left  anterior descending artery and atretic RIMA which did not supply RCA either.  The patient was started on Imdur this admission and also the dose of his  metoprolol was increased and a dose of Altace was adjusted.   The patient was pain-free on the morning of his discharge.  He was  discharged home in stable condition.   HOSPITAL LABORATORIES:  His CBC on the date of discharge showed white blood  cell count of 6.5, hemoglobin 12.3, hematocrit 36.1, platelets 249,000.  BMP  revealed sodium of 138, potassium 3.9, chloride 107, CO2 24, glucose 109,  BUN 11, creatinine 1.0.  His lipid profile in the hospital showed total  cholesterol of 132, triglycerides 75, HDL 39, LDL 78.   FOLLOWUP:  The patient will be seen by Dr. Tresa Endo on December 18, 2004 at 3  p.m. in our office and then during that visit, decision will be made about  his return to work.       MK/MEDQ  D:  11/26/2004  T:  11/26/2004  Job:  [id number]   cc:   [facility]

## 2011-04-10 NOTE — H&P (Signed)
NAME:  [name], [name] NO.:  [id number]   MEDICAL RECORD NO.:  [id number]          PATIENT TYPE:  EMS   LOCATION:  MAJO                         FACILITY:  [facility]   PHYSICIAN:  Ulyses Amor, MD DATE OF BIRTH:  August 13, 1951   DATE OF ADMISSION:  11/24/2004  DATE OF DISCHARGE:                                HISTORY & PHYSICAL   REASON FOR ADMISSION:  Jose Leonard is a 60 year old white man who is  admitted to [facility] for further evaluation of chest pain.   BRIEF HISTORY:  The patient has a history of coronary artery disease which  dates back to September 2005.  At that time he underwent coronary artery  bypass surgery for multivessel coronary artery disease.  He received a left  internal mammary artery graft to the LAD, a saphenous vein graft to the  diagonal, a saphenous vein graft to the first obtuse marginal, and a right  internal mammary artery graft to the right coronary artery.  His course was  uncomplicated.   The patient presented to the emergency department this afternoon with a 3-  day history of chest pain.  The chest pain is described as an ache in the  left anterior chest.  It radiates to the left shoulder and the left scapula.  It appears to be unrelated to activity, position, or meals.  At times it  seems to be exacerbated by movement of his left shoulder and by deep  inspiration.  It has waxed and waned over the course of the last 3 days.  He  has not taken any nitroglycerin.  He has noted dyspnea but no nausea or  diaphoresis.  There are no other exacerbating or ameliorating factors.  He  presented to the emergency department and hence is being admitted for  further evaluation.  The patient reports no cough, chest congestion, sputum  production, fever, or chills.  His appetite has been poor during this period  of time.  The patient notes that this chest pain is different from that  which preceded his coronary artery bypass  surgery.   The patient has no history of hypertension, dyslipidemia, diabetes mellitus,  or smoking.  There is a strong family history of coronary artery disease  (both mother and father).   PAST MEDICAL HISTORY:  The patient's past medical history is remarkable  otherwise only for asthma, benign prostatic hypertrophy, and quiescent  Crohn's disease.   MEDICATION ALLERGIES:  PENICILLIN.   MEDICATIONS:  Metoprolol, Zocor, and Flomax.   OPERATIONS:  Coronary artery bypass surgery and left rotator cuff surgery.   SIGNIFICANT INJURIES:  None.   SOCIAL HISTORY:  The patient is a meat cutter.  He just returned to work  from medical leave since his coronary artery bypass surgery.  He lives with  his wife.  He neither smokes nor drinks.   FAMILY HISTORY:  Both father and mother are afflicted with coronary artery  disease and cerebrovascular disease.   REVIEW OF SYSTEMS:  Review of systems reveals no new problems related to his  head, eyes, ears, nose, mouth, throat, lungs,  gastrointestinal system,  genitourinary system, or extremities.  There is no history of neurologic or  psychiatric disorder.  There is no history of fever, chills, or weight loss.   PHYSICAL EXAMINATION:  VITAL SIGNS:  Blood pressure 111/59.  Pulse 58 and  regular.  Respirations 16.  Temperature 97.  Pulse oximetry 99% on room air.  GENERAL:  The patient was a middle-aged white man in no discomfort.  He was  alert, oriented, appropriate, and responsive.  HEAD, EYES, NOSE, AND MOUTH:  Normal.  NECK:  The neck was without thyromegaly or adenopathy.  Carotid pulses were  palpable bilaterally and without bruits.  CARDIAC:  Normal S1 and S2.  There was no S3, S4, murmur, rub, or click.  Cardiac rhythm was regular.  No chest wall tenderness was noted.  LUNGS:  The lungs were clear.  ABDOMEN:  The abdomen was soft and nontender.  There was no mass,  hepatosplenomegaly, bruit, distention, rebound, guarding, or rigidity.   Bowel sounds were normal.  RECTAL AND GENITALIA:  Examinations were not performed as they were not  pertinent to the reason for acute care hospitalization.  EXTREMITIES:  The extremities were without edema, deviation, or deformity.  Radial and dorsalis pedal pulses were palpable bilaterally.  NEUROLOGIC:  Brief screening neurologic survey was unremarkable.   STUDIES:  The electrocardiogram revealed normal sinus rhythm with mild  __________ wave inversion in the anterolateral leads.  The chest radiograph,  according to the radiologist, demonstrated evidence of infiltrate or  atelectasis at the left base.  The initial set of cardiac markers revealed a  myoglobin of 73.2, CK-MB 1.8, and troponin less than 0.05.  The second set  of markers revealed a myoglobin of 70.6, CK-MB 1.5, and troponin 0.08.  Potassium is 3.9, BUN 8, and creatinine 0.8.  White count was 6.6. with a  hemoglobin of 13.2 and a hematocrit of 39.  The remaining studies were  pending at the time of this dictation.   IMPRESSION:  1.  Chest pain; rule out unstable angina.  Troponin is 0.08.  Chest CT has      demonstrated no evidence of pulmonary embolus.  2.  Coronary artery disease; status post coronary artery bypass surgery in      September 2005.  The patient received a left internal mammary artery      graft to the left anterior descending, a saphenous vein graft to the      diagonal, a saphenous vein graft to the first obtuse marginal, and a      right internal mammary artery graft to the right coronary artery.  3.  Asthma.  4.  Benign prostatic hypertrophy.  5.  Crohn's disease, quiescent.  6.  Questionable infiltrate versus atelectasis left lung base.   PLAN:  1.  Telemetry.  2.  Serial cardiac enzymes.  3.  Aspirin.  4.  Intravenous nitroglycerin.  5.  Intravenous heparin.  6.  Metoprolol.  7.  Levaquin.  8.  Followup chest x-ray. 9.  Further measures per Dr. Tresa Endo.      Mitc   MSC/MEDQ  D:   11/24/2004  T:  11/24/2004  Job:  [id number]   cc:   Nicki Guadalajara, M.D.  [phone] [address]  [place], [place] [address]  Fax: [phone]

## 2011-04-10 NOTE — Discharge Summary (Signed)
NAME:  Jose Leonard, Jose Leonard NO.:  [id number]   MEDICAL RECORD NO.:  [id number]          PATIENT TYPE:  INP   LOCATION:  2012                         FACILITY:  [facility]   PHYSICIAN:  Jose Leonard, M.D.     DATE OF BIRTH:  [birth year]   DATE OF ADMISSION:  03/17/2007  DATE OF DISCHARGE:  03/18/2007                               DISCHARGE SUMMARY   DISCHARGE DIAGNOSES:  1. Syncope, probably orthostatic.  2. Known coronary disease with coronary bypass grafting August 2005,      with subsequent progression of disease, treated medically.  3. Treated dyslipidemia  4. History of hypertension.  5. Sinusitis.   HOSPITAL COURSE:  The patient is a 60 year old male with a history of  coronary disease.  He had bypass surgery August 2005 with a LIMA to the  LAD, RIMA to the RCA, SVG to diagonal and SVG to the OM.  He has had an  atretic LIMA and RIMA.  He has been treated medically.  He called the  office yesterday after a couple episodes of syncope and near-syncope.  Each episode was after he had gotten up from a sitting or lying  position.  He came to the emergency room for further evaluation.  The  patient was seen by Dr. Jenne Leonard.  EKG showed sinus rhythm, sinus  bradycardia.  The patient admits he has actually been off his beta-  blocker for some time now.  CT of the head was obtained as well as  enzymes.  CT was negative.  His D-dimer was negative.  His troponins  were negative.  We held off of his ACE inhibitor.  He was put on IV  heparin overnight.  When seen on rounds March 18, 2007, he is improved.  He admits he had also stopped his Ranexa as an outpatient.  Blood  pressure at discharge is 110/70, standing it is 106/70.  The patient  discussed with Dr. Tresa Leonard his situation prior to discharge.  Apparently  his pay has been cut and he has had some trouble affording medications.  At discharge we cut back as much as we could and changed to a generic  where possible.  We will try  and supply him with samples of Vytorin and  Ranexa.  Dr. Tresa Leonard did want him to resume the Ranexa.  We will leave off  the metoprolol for now as his heart rate is in the 50s anyway.   DISCHARGE MEDICATIONS:  1. Coated aspirin once a day.  2. Vytorin 10/40 mg daily.  3. Imdur 60 mg a day.  4. Levaquin as directed by Dr. Juleen Leonard, 750 mg once a day for 7 days.  5. Plavix 75 mg once a day.  6. Ranexa 500 mg b.i.d.  7. We stopped his Altace 10 mg a day and put him on lisinopril 5 mg a      day.   LABORATORY DATA:  White count 6.0, hemoglobin 14, hematocrit 41,  platelets 208.  Sodium 136, potassium 3.8, BUN 9, creatinine 0.9.  CK,MB  and troponins are negative x2.  His cholesterol is 141, LDL  of 92, HDL  42.  D-dimer is 0.22.  TSH 0.90.  Hemoglobin A1c is 6.2.  CT of his head  was negative.  Portable chest with no infiltrate or heart failure, with  slight prominence of the right hilum which could be vascular.  He will  need a follow-up PA and lateral chest x-ray as an outpatient at some  point.  The INR is 1.1.   DISPOSITION:  The patient is discharged in stable condition.  Her EKG at  discharge shows a QTC of 422, sinus rhythm, without acute changes. He  will get samples of Ranexa and Vytorin 10/40 mg at the office.  He will  follow  up with Dr. Tresa Leonard.  He did have carotid artery Dopplers during this  admission, which showed no significant internal carotid artery stenosis.  He should be okay to go back to work Monday unless he has recurrent  symptoms.  In that case, we will proceed with an event monitor.      Jose Leonard, P.A.    ______________________________  Jose Leonard, M.D.    Jose Leonard  D:  03/18/2007  T:  03/18/2007  Job:  [id number]   cc:   Jose Leonard, M.D.

## 2011-04-10 NOTE — Op Note (Signed)
NAME:  Jose Leonard, Jose Leonard                      ACCOUNT NO.:  [id number]   MEDICAL RECORD NO.:  [id number]                   PATIENT TYPE:  INP   LOCATION:  2315                                 FACILITY:  [facility]   PHYSICIAN:  Salvatore Decent. Dorris Fetch, M.D.         DATE OF BIRTH:  1951/06/03   DATE OF PROCEDURE:  07/24/2004  DATE OF DISCHARGE:                                 OPERATIVE REPORT   PREOPERATIVE DIAGNOSIS:  Three-vessel coronary disease with unstable angina.   POSTOPERATIVE DIAGNOSIS:  Three-vessel coronary disease with unstable  angina.   PROCEDURES:  1.  Median sternotomy.  2.  Extracorporeal circulation.  3.  Coronary artery bypass grafting x4 (left internal mammary artery to left      anterior descending coronary artery, right internal mammary artery to      right coronary, saphenous vein graft to first diagonal, saphenous vein      graft to first obtuse marginal).  4.  Endoscopic vein harvest.   SURGEON:  Salvatore Decent. Dorris Fetch, M.D.   ASSISTANT:  Toribio Harbour, N.P.   ANESTHESIA:  General.   FINDINGS:  Good-quality targets, good-quality conduits.  Normal left  ventricular size.   CLINICAL NOTE:  Mr. Discher is a 60 year old gentleman with no prior  cardiac history, who presented with unstable angina and ruled in for a non-Q-  wave MI.  At catheterization he was found to have three-vessel disease,  including a critical lesion at the diagonal vessel that was unapproachable.  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  He had moderate disease in the LAD, circumflex, and right  coronary.  The patient was referred for coronary artery bypass grafting  given his three-vessel disease.  The indication, risks, benefits, and  alternatives were discussed in detail with the patient.  He understood and  accepted the risks and agreed to proceed.   OPERATIVE NOTE:  Mr. Hessling was brought to the preop holding area on  July 24, 2004.  There the anesthesia service placed lines to monitor  arterial, central venous, and pulmonary arterial pressure.  Intravenous  antibiotics were administered.  He was taken to the operating room,  anesthetized, and intubated.  A Foley catheter was placed, and the chest,  abdomen, and legs were prepped and draped in the usual fashion.  A median  sternotomy was performed.  The left internal mammary artery was harvested  using standard technique.  Heparin 5000 units was administered prior to  dividing the distal end of the left mammary artery.  There was excellent  flow through the vessel.   Next the right internal mammary artery was harvested, again using standard  technique.  This also was a good-quality vessel.  The remainder of the full  heparin dose was given prior to dividing the distal end of the right mammary  artery.  It also had excellent flow.   Simultaneously an incision was made in  the medial aspect of the leg and the  greater saphenous vein was harvested.  It was of good quality.  The vein was  harvested endoscopically.   The pericardium was opened and the ascending aorta was inspected and  palpated.  There was no palpable atherosclerotic disease.  The aorta was  cannulated via concentric 2-0 Ethibond pledgeted pursestring sutures.  A  dual-stage venous cannula was placed via pursestring suture in the right  atrial appendage, cardiopulmonary bypass was instituted, and the patient was  cooled to 32 degrees Celsius.  The coronary arteries were inspected and  anastomotic sites were chosen.  Conduits were inspected and cut to length.  A foam pad was placed in the pericardium to protect the left phrenic nerve,  a temperature probe was placed in the myocardial septum, and a cardioplegia  cannula was placed in the ascending aorta.   The aorta was crossclamped.  The left ventricle was emptied via the aortic  root vent.  Cardiac arrest then was achieved with a  combination of cold  antegrade blood cardioplegia and topical iced saline.  After achieving a  complete diastolic arrest and a myocardial septal temperature of 10 degrees  Celsius, the following distal anastomoses were performed:   First a reversed saphenous vein graft was placed end-to-side to the first  diagonal branch of the LAD.  This was a 1.5 mm good-quality target with a  90% stenosis.  The vein graft was of good quality.  The anastomosis was  performed with a running 7-0 Prolene suture.  There was excellent flow  through the graft.  Cardioplegia was administered.  There was good  hemostasis.   Next a reversed saphenous vein graft was placed end-to-side to the first  obtuse marginal.  This was a 2 mm good-quality intramyocardial vessel.  The  vein graft was of good quality.  The anastomosis was performed with a  running 7-0 Prolene suture.  There was excellent flow through the graft.  Cardioplegia was administered.  There was good hemostasis.   Next the right internal mammary artery was brought through a window in the  pericardium and the distal end was spatulated.  It then was anastomosed end-  to-side to the distal right coronary.  The distal right coronary was a 2 mm  good-quality target.  The mammary was a 1.5 mm good-quality conduit.  The  anastomosis was performed with a running 8-0 Prolene suture.  At the  completion of the anastomosis, the bulldog clamp was removed to inspect for  hemostasis.  It then was replaced and the mammary pedicle was tacked to the  epicardial surface of the heart to maintain the orientation of the vessel.   Next the left internal mammary artery was brought through a window in the  pericardium and the distal end was spatulated.  It then was anastomosed end-  to-side to the LAD.  The LAD was an intramyocardial vessel.  It was 2 mm in  diameter and of good quality.  The end-to-side anastomosis was performed with a running 8-0 Prolene suture.  At the  completion of the mammary to LAD  anastomosis, the bulldog clamp was briefly removed to inspect for  hemostasis.  Immediate and rapid septal rewarming was noted.  The bulldog  clamp was replaced, and the mammary pedicle was tacked to the epicardial  surface of the heart with 6-0 Prolene sutures.  Additional cardioplegia was  administered down to the aortic root and vein grafts.   The vein grafts  were cut to length.  The cardioplegia cannula was removed  from the ascending aorta.  The proximal vein graft anastomoses were  performed to 4.4 mm punch aortotomies with running 6-0 Prolene sutures.  At  the completion of the final proximal anastomosis, the patient was placed in  Trendelenburg position.  The bulldog clamps were removed from the mammary  arteries.  Lidocaine was administered.  The aortic root was de-aired and the  aortic crossclamp was removed with a total crossclamp time of 68 minutes.   The patient initially fibrillated but defibrillated spontaneously.  All  proximal and distal anastomoses were inspected for hemostasis while the  patient was being rewarmed.  The epicardial pacing wires were placed on the  right ventricle and right atrium.  When the patient had been rewarmed to a  core temperature fo 37 degrees Celsius, he was weaned from cardiopulmonary  bypass without difficulty.  The total bypass time was 110 minutes.  The  patient was in sinus rhythm and on no inotropic support at the time of  separation from bypass.  The initial cardiac index was greater than 2 L/min.  per sq. m.  The patient remained hemodynamically stable throughout the  remainder of the procedure.   A test dost of protamine was administered and was well-tolerated.  The  atrial and aortic cannulae were removed.  The remainder of the protamine was  administered without incident.  The chest was irrigated with 1 L of warm  normal saline containing 1 g of vancomycin.  Hemostasis was achieved.  Bilateral  pleural and two mediastinal chest tubes were placed through  separate subcostal incisions.  The pericardium was reapproximated with  interrupted 3-0 silk sutures.  It came together easily without tension.  The  sternum was closed with interrupted heavy-gauge stainless steel wires.  The  remainder of the incisions were closed in standard fashion with subcuticular  skin closures.  All sponge, needle, and instrument counts were correct at  the end of the procedure.  The patient was taken from the operating room to  the surgical intensive care unit in critical but stable condition.                                               Salvatore Decent Dorris Fetch, M.D.    SCH/MEDQ  D:  07/24/2004  T:  07/25/2004  Job:  [id number]   cc:   Darlin Priestly, M.D.  [phone] [address], [address]  [place]  [place] [address]  Fax: [phone]   Brooke Bonito, M.D.  [address]  [place]  [place] [address]  Fax: [phone]

## 2011-04-10 NOTE — Consult Note (Signed)
NAME:  Jose Leonard, Jose Leonard                      ACCOUNT NO.:  [id number]   MEDICAL RECORD NO.:  [id number]                   PATIENT TYPE:  INP   LOCATION:  4738                                 FACILITY:  [facility]   PHYSICIAN:  Salvatore Decent. Dorris Fetch, M.D.         DATE OF BIRTH:  [birth year]   DATE OF CONSULTATION:  07/23/2004  DATE OF DISCHARGE:                                   CONSULTATION   REASON FOR CONSULTATION:  Three vessel coronary disease.   CHIEF COMPLAINT:  Chest pain.   HISTORY OF PRESENT ILLNESS:  Jose Leonard is a 60 year old gentleman with no  previous cardiac disease.  Yesterday at work he developed severe  retrosternal chest pressure that may have been positioned slightly to the  right of midline.  This lasted for about 20 minutes.  It was relieved by  rest.  The pain recurred and was severe.  It felt like a football was being  pushed into his chest.  He did not have diaphoresis, nausea, or vomiting.  He did feel short of breath, however.  He had a friend who had recently had  heart trouble who called EMS.  When EMS arrived he was given nitroglycerin  with resolution of his pain.  In retrospect, he said he did have some  unusual pain over the weekend but he felt it was related to his left  shoulder rotator cuff problems.  He was admitted for rule out myocardial  infarction and did have positive enzymes with a peak troponin of 0.22.  He  was treated with heparin and Integrilin and nitroglycerin.  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  He had a 70%  right coronary stenosis.  He had 60% LAD stenosis, 99% in the diagonal  branch, and then 70% followed by 50% stenosis in a large OM1.  His left  ventriculogram showed some anterolateral hypokinesis but ejection fraction  was 50-55%.   PAST MEDICAL HISTORY:  1.  Left rotator cuff tear with two previous surgeries.  2.   Asthma.  3.  Benign prostatic hypertrophy.  4.  There is a questionable Crohn's disease, but no current symptoms.   MEDICATIONS ON ADMISSION:  He was on no medications prior to admission.   CURRENT MEDICATIONS:  1.  Heparin drip.  2.  Integrilin drips.  3.  Aspirin.  4.  Protonix.  5.  Zocor.  6.  Flomax.   ALLERGIES:  PENICILLIN.   SOCIAL HISTORY:  He works in Associate Professor with [employer]  He is married.  He has a remote smoking history, quit over 30 years ago.  He drinks very  rarely and socially.   FAMILY HISTORY:  His father is 2.  He had a coronary artery bypass graft at  age 43.  Mother is 68 with no heart disease.   REVIEW OF SYSTEMS:  He has  had some chest tightness over the last couple of  weeks.  Did not make much of it and thought he was having indigestion.  Otherwise, in good health and all other systems negative.   PHYSICAL EXAMINATION:  GENERAL:  Jose Leonard is a 60 year old white male in  no acute distress.  He is well-developed and well-nourished.  VITAL SIGNS:  Blood pressure 110/60, pulse 50 and regular, respirations 16.  HEENT:  Unremarkable.  NECK:  Supple without thyromegaly, adenopathy, or bruits.  LUNGS:  Clear with no wheezing.  CARDIAC:  Regular rate and rhythm.  Normal S1 and S2.  No murmurs, rubs, or  gallops.  ABDOMEN:  Soft and nontender.  EXTREMITIES:  Without clubbing, cyanosis, edema.  He has brisk capillary  refill.  He has 2+ pulses throughout.  NEUROLOGIC:  He is alert and oriented x3 and grossly intact.   LABORATORY DATA:  EKG showed the nonspecific ST abnormalities.  Chest x-ray  showed mild bronchitic changes.  His white count was 7.8, hematocrit 39,  platelets 235.  PT 12.9, PTT 31.  Sodium 138, potassium 3.8, BUN and  creatinine 13 and 0.9.  His CK was 176 with an MB of 13.  His troponin was  2.22.   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  In the setting of three vessel disease with acute coronary  syndrome and non Q-wave myocardial infarction, coronary artery bypass  grafting is indicated for survival benefit and relief of symptoms.   I discussed in detail with the patient and his wife the nature and extent of  the operation including the incisions to be used.  We plan to use bilateral  mammary arteries given his young age.  I do not think radial artery would be  of additional benefit as the circumflex, which would be the target, does not  have critical disease and he would be at high risk for early radial failure.   I discussed in detail with them the indications, risks, benefits, and  alternatives.  They understand the risks include, but are not limited to,  death, stroke, myocardial infarction, deep venous thrombosis, pulmonary  embolism, bleeding, possible need for transfusions, infections, as well as  other organ system dysfunction including respiratory, renal, or  gastrointestinal complications.  He also understands that sternal retraction  and mammary takedown could contribute to worsening of his rotator cuff  symptoms.  He understands and accepts these risks and agrees to proceed.  Will plan to proceed with surgery in the morning on Thursday, September 1.  All patient's questions were answered.                                               Salvatore Decent Dorris Fetch, M.D.    SCH/MEDQ  D:  07/23/2004  T:  07/23/2004  Job:  [id number]   cc:   Darlin Priestly, M.D.  [phone] [address], [address]  [place]  [place] [address]  Fax: [phone]   Brooke Bonito, M.D.  [address]  [place]  [place] [address]  Fax: [phone]

## 2011-04-10 NOTE — Discharge Summary (Signed)
NAME:  Jose Leonard, MONGER NO.:  [id number]   MEDICAL RECORD NO.:  [id number]          PATIENT TYPE:  OBV   LOCATION:  2022                         FACILITY:  [facility]   PHYSICIAN:  Nicki Guadalajara, M.D.     DATE OF BIRTH:  21-Oct-1951   DATE OF ADMISSION:  02/15/2009  DATE OF DISCHARGE:  02/16/2009                               DISCHARGE SUMMARY   DISCHARGE DIAGNOSES:  1. Chest pain, thought to be musculoskeletal.  2. Coronary artery disease with history of bypass grafting and      episodic chest pain, severe at times.  3. Mild anemia originally thrombocytic on initial labs, but on      followup, platelets were 224 and hemoglobin 15.4, unsure why the      initial labs were so abnormal.  4. Dyslipidemia.  5. Shoulder pain with recent injections in the shoulder.  6. Hypertension.  7. Borderline diabetes.  8. History of bilateral rotator cuff repair.  9. Gastroesophageal reflux disease.  10.Increased stress related to finances, illness, and death in family.   DISCHARGE CONDITION:  Improved.   PROCEDURES:  None.   DISCHARGE MEDICATIONS:  1. Plavix 75 mg daily.  2. Altace 5 mg daily.  3. Isosorbide 60 mg daily.  4. Aspirin 81 mg daily.  5. Multivitamin 1 tablet daily.  6. Fish oil 1000 mg daily.  7. Metoprolol ER 50 mg daily.  8. Niaspan 1000 mg daily.  9. Vytorin 10/40 daily.  10.Advair 100/50 as needed inhaler.  11.Ranexa 500 mg at bedtime.  12.Celebrex 200 mg one daily for 1 week and then if needed.   DISCHARGE INSTRUCTIONS:  1. Low-sodium, heart-healthy diet.  2. Increase activity slowly.  3. Follow up with Dr. Tresa Endo.  The office will call with date and time.   HISTORY OF PRESENT ILLNESS:  The patient presented to the emergency room  on February 15, 2009, with a history of bypass grafting in 2005 with LIMA  to the LAD, LIMA to the RCA, vein graft to the diagonal, and vein graft  to the marginal.  He has documented atresia of both arterial grafts.  Unfortunately, he continues with chronic angina.  He has benefited from  ECT as well as Ranexa therapy.  He is currently under a lot of stress  and does have left shoulder pain, but has been injected by Dr. Loreta Ave and  Dr. Mckinley Jewel, but did not help to his symptomatology very much.  His last cath was in January 2010.  He had patent vein graft, but  atretic arterial conduit.  LV function was normal.  The patient  presented to the ER with chest pain.  He ignores most of the pain, but  on February 14, 2009, he had chest pain and during that night and early  morning of February 15, 2009, it became severe.  He sat up most of the  night, was short of breath, dyspnea on exertion, but even at rest, he  was short of breath.  He did take 3 nitroglycerin sublingual without  relief of pain.  His wife brought him to the emergency  room.  He had  nitro paste on, and the pain is down to 1-2/10.  He describes it as a  sharp and then dull pain, sharp and dull, back and forth.  On the exam,  he just felt bruised in the chest.  The other history includes  dyslipidemia, hypertension, borderline diabetes.   He was seen by Dr. Tresa Endo, who did not feel this was cardiac.  He felt it  was more musculoskeletal.  He put him on IV Toradol and did do a CT  angiogram to rule out PE, which was negative for pulmonary embolus.  No  abnormalities were noted.  The next morning, he was ambulating, had no  complaints, may be a twinge of pain here and there, but overall he felt  much better.  We put him on Celebrex at discharge.  He would follow up  as an outpatient with Dr. Tresa Endo.   LABORATORY DATA:  Protime 13, INR of 1.  Metabolic panel:  Sodium 134,  potassium 4.2, chloride 103, CO2 of 26, glucose 96, BUN 6, creatinine  0.84.  Total bili 0.7, alkaline phos 61, SGOT 20, SGPT 22.  Cardiac  markers were negative.  Initial CK 146, MB 5.1, and troponin-I was  slightly elevated at 0.42, but that was the only abnormal troponin.   Others were totally negative.  PTT was 35.  BNP was 52, glycohemoglobin  4.9.  TSH 1.955.  On followup, cardiac panel 120 with MB of 1.8,  troponin less than 0.01.  Hemoglobin was actually 10.9, was redone and  it was incorrect.  Hemoglobin according to the numbers, it was correct,  unsure how the hemoglobin went from 10 to 15 without any blood products,  and he was getting fluids at 50 mL now.  Followup third cardiac panel,  CK 156, MB 2, and troponin I less than 0.01.  Prior to discharge, sodium  139, potassium 4.3, chloride 108, CO2 of 22, glucose 98, BUN 9,  creatinine 0.92.  Chest x-ray, portable, no active cardiopulmonary  disease and CT angio, no pulmonary embolus, dependent bibasilar  atelectasis, but no infiltrates, edema, effusion.  Normal-caliber  thoracic aorta.  No dissection.  EKG, sinus rhythm without any acute  changes.  Dr. Tresa Endo saw him on the day of discharge, who felt he was  stable for discharge to home and will follow up as an outpatient.      Darcella Gasman. Annie Paras, N.P.    ______________________________  Nicki Guadalajara, M.D.    LRI/MEDQ  D:  02/18/2009  T:  02/19/2009  Job:  [id number]

## 2011-04-10 NOTE — Cardiovascular Report (Signed)
NAME:  Jose Leonard, Jose Leonard NO.:  [id number]   MEDICAL RECORD NO.:  [id number]          PATIENT TYPE:  INP   LOCATION:  3743                         FACILITY:  [facility]   PHYSICIAN:  Cristy Hilts. Jacinto Halim, MD       DATE OF BIRTH:  1951/07/29   DATE OF PROCEDURE:  07/27/2006  DATE OF DISCHARGE:                              CARDIAC CATHETERIZATION   PROCEDURE PERFORMED:  1. Left ventriculography.  2. Select right coronary aortography.  3. Saphenous vein graft.  4. Left and right internal mammary arteriography.   INDICATIONS:  Mr. Hyde Sires is a 60 year old gentleman with a history  of known coronary artery disease, status post CABG in the past in August of  2005.  He was admitted to the hospital with chest pain again indicative of  angina pectoris.  Because of his significant comorbidities and ongoing risk  factors, he was brought to the cardiac catheterization lab to evaluate his  coronary anatomy.   HEMODYNAMIC DATA:  The left ventricle measurements were 101/1 with end  diastolic pressure of 9 mmHg.  The aortic pressure 95/58 with a mean of 76  mmHg.  There was no significant gradient across the aortic valve.   Angiographic data of the left ventricle.  Left ventricular systolic function  was normal with ejection fraction of 60%.  There was no significant wall  motion abnormality.   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  This was not selectively  cannulated, however, it was well visualized and shows a smooth, proximal 30-  40% stenosis.  The entire RCA fills with the large PDA and the PLA.   Left Main:  Left main is a moderate sized vessel which is a long vessel.  It  is smooth and normal.   Circumflex:  The circumflex is a moderate caliber vessel.  It continues in  the AV groove after giving origin to a large obtuse marginal-1 which is  occluded in its proximal segment.  This obtuse  marginal is supplied by  saphenous vein graft.   SVG to OM-1:  SVG to OM-1 is patent, however, there is a proximal 40 at most  50% stenosis in its origin at the insertion of the aorta.  There is also a  valve noted at this proximal segment.  There is a also a valve noted in the  mid segment.  Otherwise, there is brisk flow noted in this vein graft with  excellent flow into the obtuse marginal.   LAD:  The LAD is large vessel.  It gives origin to a moderate sized diagonal-  1 and diagonal-2.  After the origin of diagonal-2, the LAD ends before  reaching the apex.  The LAD is smooth.  The diagonal-1 is occluded in its  proximal segment.  Diagonal-2 is smooth and normal.   SVG to Diagonal-1:  SVG to Diagonal-1 is widely patent.   LIMA to LAD and RIMA to RCA:  The LIMA and RIMA are both patent.   IMPRESSION:  No significant change  in coronary anatomy since February 13, 2005  except for very minimal progression of proximal disease in the obtuse  marginal.   RECOMMENDATIONS:  Based on the coronary anatomy, continued medical therapy  is indicated.  A total of 175 mL of contrast were used for diagnostic  angiography.  Again, continued risk factor modification is indicated.   TECHNIQUE AND PROCEDURE:  Under usual topical, using a 6-French right  femoral artery access, a 6-French multipurpose B2 catheter was advanced into  the ascending aorta with 0.025 inch J-wire. The catheter was introduced into  the left ventricle and left ventricle pressure monitored.  Hand contrast  injection of the left ventricle was performed both in LAO and RAO  projection.  The catheter was flushed with saline, pulled back into the  ascending aorta and pressure gradient across the aortic valve was monitored.  The left main coronary artery was selectively engaged and angiography was  performed.  The right coronary artery was subselectively engaged and RCA was  visualized.  I also attempted to engage his right coronary  artery with AL-1,  however, I was unable to do so.  Then I used the Multipurpose B2 catheter to  engage the SVG to obtuse marginal and SVG to diagonal and then pulled it out  of the body in the usual fashion.  A 5-French LIMA catheter was utilized to  engage the RIMA and LIMA and angiography was performed.  200 mcg of  intracoronary nitroglycerin was also administered through the RIMA.  Then  the catheter was pulled out of the body in the usual fashion.  The right  angiography was performed through the arterial access sheath and access was  closed with StarClose with excellent hemostasis.      Cristy Hilts. Jacinto Halim, MD  Electronically Signed     JRG/MEDQ  D:  07/27/2006  T:  07/27/2006  Job:  [id number]   cc:   Brooke Bonito, M.D.  Nicki Guadalajara, M.D.

## 2011-04-10 NOTE — Cardiovascular Report (Signed)
NAME:  Jose Leonard, GRIMLEY NO.:  [id number]   MEDICAL RECORD NO.:  [id number]          PATIENT TYPE:  INP   LOCATION:  2035                         FACILITY:  [facility]   PHYSICIAN:  Darlin Priestly, MD  DATE OF BIRTH:  01/01/1951   DATE OF PROCEDURE:  02/13/2005  DATE OF DISCHARGE:  02/13/2005                              CARDIAC CATHETERIZATION   PROCEDURE:  1.  Left heart catheterization.  2.  Coronary angiography.  3.  Left ventriculogram.  4.  Saphenous vein grafts.  5.  Right internal mammary angiography.  6.  Left internal mammary angiography.   COMPLICATIONS:  None.   INDICATIONS FOR PROCEDURE:  Mr. Collier is a 60 year old male patient of  Dr. Juleen China and Dr. Daphene Jaeger with a history of CAD status post non-Q wave MI,  in 2005, with subsequent bypass surgery in September 2005 by Dr. Andrey Spearman consisting of LIMA to the LAD, LIMA to the RCA, vein graft to  the diagonal, and vein graft to the OM.  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  He was  readmitted in January with repeat chest pain and repeat catheterization per  Dr. Tresa Endo revealing atretic LIMA and RIMA with minimal filling of the LAD or  RCA.  He was noted, at that time, to have a 70% proximal RCA lesion which  was unchanged from his prior catheterization.  Recently, he has complained  of increasing chest pain and shortness of breath.  He is now for repeat  catheterization to assess his grafts.   DESCRIPTION OF PROCEDURE:  After informed consent was obtained, the patient  was brought to the cardiac catheterization lab where his right groin was  shaved, prepped and draped in the usual sterile fashion.  Using modified  Seldinger technique, #6 arterial sheaths were placed in the right femoral  artery.  6 French diagnostic catheter was used to perform diagnostic  angiography.  The left main was a large vessel with no significant disease.  It  is again noted that the right coronary artery arises from the left  coronary cusp, however, this is difficult to selectively engage with the  left or right coronary catheter.  The LAD is a large vessel that gives off  two diagonal branches.  The LAD is noted to have 50% proximal stenosis as  well as faint filling noted of the IMA with antegrade flow.  There is a  totally occluded proximal first diagonal as well as a small second diagonal  in the distal LAD.  The LIMA is a small vessel which appears diffusely  diseased in its distal segment, however, it does fill the LAD well.  There  does not appear to be any disease beyond the graft insertion.  The saphenous  vein graft to the diagonal reveals mild 30% distal tapering prior to the  touch down of the diagonal, however, there does not appear to be any  significant disease in the graft or distal to the graft insertion.  The AV  circumflex is a large vessel coursing and  gives rise to two obtuse marginal  branches.  The circumflex has no significant disease.  The first OM was a  large vessel which bifurcates in the mid segment where a 70% ostial lesion.  There is a patent saphenous vein graft which inserts into the bifurcation  area of the obtuse marginal.  The vein graft does appear to have several  valves throughout its course with 30% proximal narrowing, however, there is  excellent flow in the graft and distal to the graft insertion with no  significant disease beyond the graft.  The right coronary artery appears to  have a 60% proximal stenosis.  There is excellent flow throughout the RCA  PDA branch.  The right internal mammary artery is a small vessel, however,  it does fill the RCA well with retrograde flow out the ostium of the RCA.  There was no evidence of disease within the RIMA proper.  After the RIMA was  injected, there did appear to be some dye staining in the proximal portion  of the RIMA, however, this was not selectively  engaged.  The patient  remained asymptomatic.  Left ventriculogram was preserved with EF 50%.   HEMODYNAMICS:  Systolic arterial pressure 93/53, LV pressure 96/2, LVEDP 18.   CONCLUSION:  1.  Patent but small and diseased LIMA to the LAD, however, the patient does      have excellent flow down his native LAD.  2.  Patent saphenous vein graft to the diagonal with 30% lesion prior to the      touch down of the diagonal.  3.  Patent vein graft to the obtuse marginal with 30% proximal narrowing to      the vein graft.  4.  Patent but small RIMA to the distal RCA.  5.  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  6.  Normal LV systolic function.  7.  Elevated LVEDP.      RHM/MEDQ  D:  02/13/2005  T:  02/13/2005  Job:  [id number]   cc:   Nicki Guadalajara, M.D.  [phone] [address], [address]  [place], [place] [address]  Fax: [phone]   Brooke Bonito, M.D.  [address]  [place]  [place] [address]  Fax: [phone]

## 2011-04-10 NOTE — Op Note (Signed)
[facility]  Patient:    Jose Leonard, Jose Leonard Visit Number: [id number] MRN: [id number]          Service Type: DSU Location: [facility] Attending Physician:  Colbert Ewing Proc. 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  PROCEDURE:  Left shoulder examination under anesthesia, arthroscopy, debridement glenohumeral joint, inferior labral attrition, arthroscopic acromioplasty with CA ligament release.  Excision distal clavicle, open repair rotator cuff tear with concept repair system utilizing fiber wire to repair cuff into a bondy trough.  SURGEON:  Loreta Ave, M.D.  ASSISTANT:  Arlys John D. Petrarca, P.A.-C.  ANESTHESIA:  General anesthesia.  ESTIMATED BLOOD LOSS:  Minimal.  SPECIMENS:  None.  CULTURES:  None.  COMPLICATIONS:  None.  DRESSING:  Soft compressive with shoulder immobilizer.  DESCRIPTION OF PROCEDURE:  The patient was brought to the operating room and placed on the operating table in supine position.  After adequate anesthesia had been obtained, the left shoulder was examined.  Full motion with good stability.  Placed in beachchair position on the shoulder positioner.  Prepped and draped in the usual sterile fashion.  Three standard arthroscopic portals anterior, posterior, and lateral.  Shoulder with blunt obturator, distended, and inspected.  Confirmation of full-thickness tear of supraspinatous tendon cm retracted, but very mobile and reasonable tissue quality.  Biceps tendon and biceps anchor intact with some mild tendonitis not marked.  Articular cartilage looked good as did capsular and ligamentous structures.  Some fraying of the anterior and inferior labrum debrided  with a shaver to a stable surface.  No instability pattern.  Cannula redirected subacromially.  Very tight subacromial space and full thickness tear confirmed. Type 3 acromion. Acromioplasty showed a type I acromion with shaver and highspeed bur, releasing CA ligament.  Distal clavicle exposed with grade IV changes and distal clavicle osteolysis.  Lateral cm sharply resected.  Adequacy of decompression as well as confirmation of cuff tear confirmed viewing from all portals.  Extensive fluid removed.  Lateral portal opening into a deltoid splitting incision exposing the subacromial space.  Bony trough created in the humerus off the tuberosity for repair.  The cuff was then wellcaptured with two #2 fiber wire sutures that were weaved medially into good healthy tissue. These were then brought through multiple tunnel creating in the humerus with the concept repair system.  Once completed, the fiber wire was then pulled tight and overtied over a bony bridge, sutures going through bony tunnels. This gave me a nice firm watertight closure of the cuff with an excellent repair bringing it into good attachment position on the humerus.  Adequacy of decompression confirmed digitally at the time of cuff repair.  Full passive motion at completion of repair without undue tension.  Wound irrigated. Deltoid  closed with Vicryl.  Skin and subcutaneous tissue with Vicryl. Portals closed with nylon.  Margins of the wound injected with Marcaine as was the subacromial space.  A sterile compressive dressing applied.  The shoulder immobilizer applied.  Anesthesia reversed.  Brought to the recovery room. Tolerated the surgery well with no complications. Attending Physician:  Colbert Ewing DD:  07/21/01 TD:  07/21/01 Job: [id number]

## 2011-04-10 NOTE — Discharge Summary (Signed)
NAME:  Jose Leonard, Jose Leonard NO.:  [id number]   MEDICAL RECORD NO.:  [id number]          PATIENT TYPE:  INP   LOCATION:  2037                         FACILITY:  [facility]   PHYSICIAN:  Salvatore Decent. Dorris Fetch, M.D.DATE OF BIRTH:  29-Dec-1950   DATE OF ADMISSION:  07/22/2004  DATE OF DISCHARGE:  07/28/2004                                 DISCHARGE SUMMARY   ADMITTING DIAGNOSIS:  Chest pain.   PAST MEDICAL HISTORY AND DISCHARGE DIAGNOSES:  1.  Left rotator cuff tear, with two previous surgery.  2.  Asthma.  3.  Benign prostatic hypertrophy.  4.  Questionable Crohn's disease, with no current symptoms.  5.  Three-vessel coronary artery disease, status post coronary artery bypass      grafting x 4.   BRIEF HISTORY:  The patient is a 60 year old male with no previous cardiac  disease.  On the day of admission, he was working when he developed severe  retrosternal chest pain that he believed to be positioned slightly to the  right of midline.  This lasted approximately 20 minutes and was relieved  with rest.  The pain recurred and was quite severe.  The patient did not  have diaphoresis, nausea, or vomiting.  He felt short of breath, however,  and a friend called EMS.  EMS arrived and gave the patient nitroglycerin,  with resolution of his pain.  The patient stated that he had experienced  some unusual pain over the previous weekend but felt it was related to his  left shoulder rotator cuff problems.  The patient was subsequently admitted  to [facility] to rule out myocardial infarction.  The patient had positive  cardiac enzymes and was treated with heparin, Integrilin, and nitroglycerin.  The patient was then subsequently taken for cardiac catheterization on  July 23, 2004 by Dr. Jenne Campus, which revealed three-vessel coronary artery  disease.  The ventriculogram revealed some anterolateral hypokinesis, with  an ejection fraction of 50-55%.   HOSPITAL COURSE:  The patient was  admitted from the emergency room at [facility] with complaints of chest pain.  He was taken for urgent cardiac  catheterization by Dr. Jenne Campus, which revealed three-vessel coronary artery  disease, as previously started.  Dr. Dorris Fetch from [facility] was consulted  regarding surgical revascularization.  After evaluation of the patient, it  was Dr. Sunday Corn opinion that the patient should undergo coronary  artery bypass grafting.   The patient was taken to the O.R. on July 24, 2004 for coronary artery  bypass grafting x 4.  Left internal mammary artery was grafted to the LAD,  saphenous vein was grafted to the diagonal, saphenous vein was grafted to  the OM1, and the right internal mammary artery was grafted to the distal  right coronary artery.  Endoscopic vessel harvesting was performed on the  right thigh.  The patient tolerated the procedure well and was  hemodynamically stable immediately postoperatively.  The patient was  transferred from the O.R. to the ICU in stable condition.  The patient was  extubated without complication and woke up from anesthesia neurologically  intact.  The patient's postoperative course was as expected.  On postoperative day  one, he was complaining of soreness at the testing site.  He was afebrile,  with stable vital signs, and maintaining a sinus rhythm.  The patient was  neurologically intact and was doing well.  The patient was volume overloaded  and diuresed accordingly postoperatively.  His chest tubes and invasive  lines were discontinued in a routine fashion, and the patient was  transferred to Unit 2000 without complications.  The patient began cardiac  rehab on postoperative day one and tolerated this very well.  He continued  to progress as expected with rehab postoperatively.   The patient continued with excellent progress postoperatively.  He remained  without complication.  On postoperative day four, the patient was without   complaint.  He was afebrile, with stable vital signs.  He was intermittently  between a normal sinus rhythm and sinus tachycardia.  The patient was  diuresing well.  On physical exam, cardiac was recovery room.  The lungs  were clear to auscultation bilaterally.  The abdomen was soft, with active  bowel sounds in all four quadrants.  The incisions were clean, dry, and  intact, and the extremities revealed trace edema bilaterally.  The patient  was progressing quite well postoperatively and was felt to be ready for  discharge home at this time, with continued diuresis.   LABORATORIES:  CBC and BMP on July 26, 2004:  White count 8.2,  hemoglobin 8.8, hematocrit 25.1, platelets 143.  Sodium 135, potassium 3.8,  BUN 11, creatinine 1, glucose 122.   CONDITION ON DISCHARGE:  Improved.   MEDICATIONS:  1.  Lopressor 25 mg p.o. b.i.d.  2.  Zocor 40 mg daily.  3.  Plavix 75 mg daily.  4.  Flomax 0.4 mg daily.  5.  Advair Diskus per home dose.  6.  Lasix 40 mg p.o. daily x 3 days.  7.  K-Dur 20 mEq p.o. daily x 3 days.  8.  Tylox 1-2 p.o. q.4-6 h. p.r.n. pain.   ACTIVITY:  No driving.  No lifting more than 10 pounds.  The patient was to  continue __________  exercising.   DIET:  Low salt, lowfat.   WOUND CARE:  The patient may shower daily and clean the incision with soap  and water.  If wound problems arise, the patient was to contact __________  office.   FOLLOWUP:  1.  With Dr. Tresa Endo two weeks after discharge.  The patient was to contact      his office to arrange an appointment.  The patient is to have a chest x-      ray taken at Dr. Landry Dyke office.  2.  With Dr. Dorris Fetch three weeks after discharge.  The office will      contact the patient with this date and time.  The patient was instructed      to bring chest x-ray from the appointment with Dr. Tresa Endo to the      appointment with Dr. Dorris Fetch.      AY/MEDQ  D:  08/28/2004  T:  08/28/2004  Job:  [id number]   cc:    Salvatore Decent. Dorris Fetch, M.D.  [address]  [place]  [place] [address]   Tresa Endo, M.D.

## 2011-04-10 NOTE — Discharge Summary (Signed)
NAME:  Jose Leonard, Jose Leonard NO.:  [id number]   MEDICAL RECORD NO.:  [id number]          PATIENT TYPE:  INP   LOCATION:  2035                         FACILITY:  [facility]   PHYSICIAN:  Nicki Guadalajara, M.D.     DATE OF BIRTH:  01-11-1951   DATE OF ADMISSION:  02/12/2005  DATE OF DISCHARGE:  02/13/2005                                 DISCHARGE SUMMARY   DISCHARGE DIAGNOSES:  1.  Chest pain consistent with unstable angina, catheterization this      admission revealing no significant change.  Plan is for continued      medical therapy.  2.  Coronary disease with coronary artery bypass grafting July 24, 2004,      by Dr. Dorris Fetch with recurrent chest pain and catheterization in      November 25, 2004, showing an atretic left internal mammary artery to left      anterior descending with 50% native left anterior descending atretic      right internal mammary artery to the right coronary artery with a 70%      native right coronary artery stenosis.  3.  Dyslipidemia   HOSPITAL COURSE:  The patient 60 year old male followed by Dr. Tresa Endo and Dr.  Juleen China who had surgery in September 2005.  He had recurrent chest pain. He  had a negative Cardiolite as an outpatient last Winter.  He ultimately  underwent catheterization on November 25, 2004, which showed patent grafts  except for an atretic LIMA to the LAD with a 50% native LAD and atretic RMA  to the RCA with a 70% native RCA. He was treated medically.  He came in  February 12, 2005, with chest pain worrisome for unstable angina with  complaints of tightness and burning in his chest after walking to the  bathroom.  His EKG showed nonspecific changes.  He was started on IV heparin  and nitrates admitted to telemetry and set up for diagnostic  catheterization.  This was done February 13, 2005, by Dr. Jenne Campus which  revealed small, but patent RIMA to the RCA with a 60% proximal RCA stenosis,  patent SVG to the OM,  patent SVG to diagonal and  a small LIMA to the LAD  with a 50% mid LAD lesion with an EF of 50%.  Plan is for continued medical  therapy.  We did add a PPI.  He may be a candidate for EECP.  We feel it  would be discharged late on the 24th.   LABORATORY DATA AND X-RAY FINDINGS:  White count 6.6, hemoglobin 12.4,  hematocrit 36.9, platelets 246.  Sodium 139, potassium 4.2, BUN 16,  creatinine 1.0.  Liver functions were normal.  Troponins were negative x3.  CK-MBs are negative x3.  TSH is 1.06.   DISCHARGE MEDICATIONS:  1.  Metoprolol was changed to 50 mg in the morning and 25 mg at night      because of some bradycardia during the night.  2.  Coated aspirin q.d.  3.  Zocor 40 mg a day.  4.  Altace 5 mg a day.  5.  Niaspan 500 mg h.s.  6.  Imdur 30 mg a day.  7.  Fish oil as taken at home.  8.  Protonix 40 mg a day.  9.  Nitroglycerin sublingual p.r.n.   CONDITION ON DISCHARGE:  The patient discharged stable condition.   FOLLOW UP:  He will follow up with Dr. Tresa Endo.  He has an appointment on the  April 5, and will keep this.  He has been kept out of work until he is  cleared to return by Dr. Tresa Endo.      LKK/MEDQ  D:  02/13/2005  T:  02/13/2005  Job:  [id number]

## 2011-04-10 NOTE — Discharge Summary (Signed)
NAME:  Jose Leonard, Jose Leonard NO.:  [id number]   MEDICAL RECORD NO.:  [id number]          PATIENT TYPE:  INP   LOCATION:  3743                         FACILITY:  [facility]   PHYSICIAN:  Raymon Mutton, P.A. DATE OF BIRTH:  07-06-1951   DATE OF ADMISSION:  07/23/2006  DATE OF DISCHARGE:  07/27/2006                                 DISCHARGE SUMMARY   DISCHARGE DIAGNOSES:  1. Known coronary artery disease status post coronary artery bypass      grafting in 2005.  Status post catheterization on this admission - no      intervention, medical therapy.  2. Hyperlipidemia.  3. Overweight.   HISTORY OF PRESENT ILLNESS AND HOSPITAL COURSE:  This is a 60 year old  Caucasian gentleman patient of Dr. Tresa Endo who presents to the emergency room  on July 23, 2006 with complaints of chest pain.  He has been doing well up  until a few days prior to his presentation when he started having chest  pain, some indigestion, shortness of breath and the pain radiated to the  left shoulder.  He denied diaphoresis, nausea, vomiting.  Pain had been on  and off during this last few days but on the day of presentation, it mostly  stayed on and the patient decided to come to the emergency room to be  checked.   He was admitted on rule out MI protocol.  His enzymes were cycled and it  revealed __________.   We kept the patient in the hospital over the weekend and the holiday and  eventually on July 27, 2006 he was cathed by Dr. Jacinto Halim.  The dictated  report of the catheterization is not available to me at the present time of  dictation but the diagram of the cath revealed no new lesions.  Atretic LIMA  and RIMA.  Both circumflex and native circumflex and diagonal-1 had 100%  stenosis-occluded the grafts were patent.  RCA had 30-40% stenosis in the  proximal portion.  EF was 65%.   His groin was closed using StarClose device and the patient was transferred  to the floor for 2-hour bedrest in  stable condition.   RECOMMENDATIONS:  Continue medical therapy.   The patient tolerated the procedure well.  We will discharge him in a couple  hours given that he remains stable after ambulation around the hospital  floor.   DISCHARGE MEDICATIONS:  1. Metoprolol 50 mg b.i.d.  2. Protonix 40 mg daily.  3. __________  mononitrate 60 mg daily.  4. Advair 100/50 mg p.r.n.  5. Advair 100/50 mg b.i.d.  6. Niaspan 1000 mg daily.  7. Altace 5 mg daily.  8. Vytorin 10/40 mg daily.  9. Aspirin 81 mg daily.  10.Plavix 75 mg daily.   DISCHARGE DIET:  Low salt, low fat, low cholesterol diet.   DISCHARGE ACTIVITY:  No driving greater than 3 days.  No lifting greater  than 5 pounds for 3 days.   The patient may return to work on August 02, 2006.   FOLLOW-UP APPOINTMENTS:  Schedule with Dr. Tresa Endo on August 06, 2006 at  11:30 a.m.  Raymon Mutton, P.A.     MK/MEDQ  D:  07/27/2006  T:  07/27/2006  Job:  [id number]   cc:   Brooke Bonito, M.D.  Nicki Guadalajara, M.D.

## 2011-04-10 NOTE — H&P (Signed)
NAME:  Jose Leonard, Jose Leonard                      ACCOUNT NO.:  [id number]   MEDICAL RECORD NO.:  [id number]                   PATIENT TYPE:  EMS   LOCATION:  MAJO                                 FACILITY:  [facility]   PHYSICIAN:  Mobolaji B. Corky Downs, M.D.            DATE OF BIRTH:  06/07/1951   DATE OF ADMISSION:  07/22/2004  DATE OF DISCHARGE:                                HISTORY & PHYSICAL   CHIEF COMPLAINT:  Retrosternal chest pressure.   HISTORY OF PRESENT ILLNESS:  Mr. Collister is a 60 year old white male who  was at work this morning at about 11 a.m.  He developed severe retrosternal  chest pressure which lasted for about 20 minutes and was relieved by rest.  The pain recurred before the EMS crew arrived and when he was given  nitroglycerin, the pain also went away.  He denied any diaphoresis, nausea,  no vomiting, palpitations, no tingling.  He had some shortness of breath.  His breathing was a little bit more labored.  His wife did say that over the  weekend on Saturday, when he was mowing the lawn, he had an unusual pain  which was sternal and also over his left shoulder.  Because he has always  been used to left shoulder pain, he did not think much of it.  He does not  smoke and no significant family history of premature cardiovascular event.  However, his father had a bypass at the age of 67 years.  He does not smoke  cigarettes.   PAST MEDICAL HISTORY:  1.  Asthma.  2.  BPH.  3.  Left shoulder rotator cuff tear.   PAST SURGICAL HISTORY:  He has had surgery on the left shoulder x 2.  The  patient had colonoscopy some years ago and it was suspicious for  Crohn's  disease.  He was instructed to follow up for repeat colonoscopy but he did  not.   MEDICATIONS:  Oxycodone, hydrocodone, Advair, he once used Flomax for his  urinary symptoms but currently not using Flomax.   ALLERGIES:  Penicillin which he possibly had an aphylactic reaction.   FAMILY HISTORY:  Father had  bypass surgery at the age of 4 years, about  three years ago.  Both parents have diabetes mellitus.  Mom had colon cancer  at the age of 50 years.  He has two brothers and one sister.  One brother  has Parkinson's disease and the other brother has morbid obesity and  arthritis at the age of 53.   SOCIAL HISTORY:  The patient does not smoke cigarettes, occasionally drinks  alcohol, no use of illicit drugs.  He is married, has three children.  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   REVIEW OF SYMPTOMS:  Negative for nausea, vomiting, diarrhea, abdominal  pain.  No dysuria, urgency, hematuria, however, he does have nocturia.  He  started having  headaches after the nitroglycerin was given.  Off and on  knee, elbow, and left shoulder pain.  The rest of the review of systems was  negative for other symptoms.   The patient was seen in the emergency department and is currently on  nitroglycerin infusion, Integrillin, and heparin infusion.  He was evaluated  by Dr. Mikey Bussing group.   PHYSICAL EXAMINATION:  VITAL SIGNS:  Temperature 98.6, blood pressure 110/67, pulse rate 52,  respiratory rate of 20.  GENERAL:  The patient is alert and oriented to time, place, and person.  HEENT:  Normocephalic, atraumatic head.  Pupils equal, round, reactive to  light and accommodation.  Extraocular movements intact.  Mucous membranes  moist.  No exudate.  NECK:  No cervical lymphadenopathy, no carotid bruit, no thyromegaly.  LUNGS:  Clear to auscultation.  HEART:  S1 and S2, bradycardia, no murmur or rub.  ABDOMEN:  Nontender, nondistended, soft, no palpable hepatosplenomegaly,  bowel sounds present.  RECTAL:  Prostate appears normal in size, normal anal tone, stool guaiac  faint.  EXTREMITIES:  No pedal edema, no calf tenderness, dorsalis pedes pulses  bilaterally positive.  CNS:  No focal neurological deficit.   LABORATORY DATA:  EKG showed sinus bradycardia at a  rate of 36 beats per  minute, normal PR interval, and normal QT, no ST segment changes.  Cardiac  enzymes at the point of care, initial CK MB 1.5, normal, myoglobin 9.1,  normal, troponin less than 0.05, normal.  Second set of cardiac enzymes with  myoglobin 8, CK MB 3.2, and troponin 0.17, elevated.  Third set of cardiac  enzymes myoglobin 76.6, CK MB 3.2, troponin 0.22.  BMP with sodium 158,  potassium 3.7, chloride 106, BUN 18, glucose 97.  PT INR 12.9/1, PTT 31.  Hematocrit 41, hemoglobin 13.9.  Chest x-ray showed mild changes, no  cardiomegaly, no widened mediastinum.   ASSESSMENT AND PLAN:  1.  Mr. Kitt is a 60 year old white male with no known history of CAD      presenting with typical chest pain and elevated troponin with no ST      changes on EKG, is evaluated by the cardiologist and will be sent for      cardiac catheterization in the morning.  Currently, the patient is on      Integrillin, nitroglycerin, and heparin infusion which I would continue      and continue aspirin 325 mg p.o. daily.  Will hold on beta blocker for      now.  Check his fasting lipid profile and hemoglobin A1C.  Hopefully, he      will get a ventriculogram during the cath, otherwise, would check a 2D      echo to assess his ejection fraction.  2.  History of asthma, would continue Albuterol p.r.n. and will encourage      the patient to restart his Advair Diskus      prior to discharge.  3.  History of BPH, will start Flomax 0.4 mg p.o. daily.  4.  History of left shoulder rotator cuff injury with Percocet p.r.n. pain.                                                Mobolaji B. Corky Downs, M.D.    MBB/MEDQ  D:  07/22/2004  T:  07/22/2004  Job:  [id number]  cc:   Brooke Bonito, M.D.  [address]  [place]  [place] [address]  Fax: [phone]   Darlin Priestly, M.D.  [phone] [address]  [place]  [place] [address]  Fax: [phone]

## 2011-05-28 ENCOUNTER — Emergency Department (HOSPITAL_COMMUNITY)
Admission: EM | Admit: 2011-05-28 | Discharge: 2011-05-28 | Disposition: A | Discharge status: Home / Self Care | Payer: Medicare Other | Attending: Emergency Medicine | Admitting: Emergency Medicine

## 2011-05-28 ENCOUNTER — Emergency Department (HOSPITAL_COMMUNITY): Payer: Medicare Other

## 2011-05-28 DIAGNOSIS — N453 Epididymo-orchitis: Secondary | ICD-10-CM | POA: Insufficient documentation

## 2011-05-28 DIAGNOSIS — J449 Chronic obstructive pulmonary disease, unspecified: Secondary | ICD-10-CM | POA: Insufficient documentation

## 2011-05-28 DIAGNOSIS — E785 Hyperlipidemia, unspecified: Secondary | ICD-10-CM | POA: Insufficient documentation

## 2011-05-28 DIAGNOSIS — J4489 Other specified chronic obstructive pulmonary disease: Secondary | ICD-10-CM | POA: Insufficient documentation

## 2011-05-28 DIAGNOSIS — N509 Disorder of male genital organs, unspecified: Secondary | ICD-10-CM | POA: Insufficient documentation

## 2011-05-28 DIAGNOSIS — I2510 Atherosclerotic heart disease of native coronary artery without angina pectoris: Secondary | ICD-10-CM | POA: Insufficient documentation

## 2011-05-28 LAB — CBC
Hemoglobin: 15.4 g/dL (ref 13.0–17.0)
MCH: 32.1 pg (ref 26.0–34.0)
RBC: 4.8 MIL/uL (ref 4.22–5.81)
WBC: 8.1 10*3/uL (ref 4.0–10.5)

## 2011-05-28 LAB — POCT I-STAT, CHEM 8
Calcium, Ion: 1.24 mmol/L (ref 1.12–1.32)
Glucose, Bld: 90 mg/dL (ref 70–99)
HCT: 46 % (ref 39.0–52.0)
Hemoglobin: 15.6 g/dL (ref 13.0–17.0)
Potassium: 4.4 mEq/L (ref 3.5–5.1)

## 2011-05-28 LAB — URINALYSIS, ROUTINE W REFLEX MICROSCOPIC
Bilirubin Urine: NEGATIVE
Ketones, ur: NEGATIVE mg/dL
Leukocytes, UA: NEGATIVE
Nitrite: NEGATIVE
Specific Gravity, Urine: 1.01 (ref 1.005–1.030)
Urobilinogen, UA: 0.2 mg/dL (ref 0.0–1.0)

## 2011-05-28 LAB — DIFFERENTIAL
Basophils Absolute: 0.1 10*3/uL (ref 0.0–0.1)
Basophils Relative: 1 % (ref 0–1)
Monocytes Relative: 11 % (ref 3–12)
Neutro Abs: 4.4 10*3/uL (ref 1.7–7.7)
Neutrophils Relative %: 54 % (ref 43–77)

## 2011-05-29 LAB — GONOCOCCUS DNA, PCR: GC Probe Amp, Genital: NEGATIVE

## 2011-08-09 ENCOUNTER — Emergency Department (HOSPITAL_COMMUNITY)
Admission: EM | Admit: 2011-08-09 | Discharge: 2011-08-09 | Disposition: A | Discharge status: Home / Self Care | Payer: Medicare Other | Attending: Emergency Medicine | Admitting: Emergency Medicine

## 2011-08-09 ENCOUNTER — Emergency Department (HOSPITAL_COMMUNITY): Payer: Medicare Other

## 2011-08-09 DIAGNOSIS — Z79899 Other long term (current) drug therapy: Secondary | ICD-10-CM | POA: Insufficient documentation

## 2011-08-09 DIAGNOSIS — E785 Hyperlipidemia, unspecified: Secondary | ICD-10-CM | POA: Insufficient documentation

## 2011-08-09 DIAGNOSIS — Z951 Presence of aortocoronary bypass graft: Secondary | ICD-10-CM | POA: Insufficient documentation

## 2011-08-09 DIAGNOSIS — I2510 Atherosclerotic heart disease of native coronary artery without angina pectoris: Secondary | ICD-10-CM | POA: Insufficient documentation

## 2011-08-09 DIAGNOSIS — Z87891 Personal history of nicotine dependence: Secondary | ICD-10-CM | POA: Insufficient documentation

## 2011-08-09 DIAGNOSIS — J4489 Other specified chronic obstructive pulmonary disease: Secondary | ICD-10-CM | POA: Insufficient documentation

## 2011-08-09 DIAGNOSIS — M545 Low back pain, unspecified: Secondary | ICD-10-CM | POA: Insufficient documentation

## 2011-08-09 DIAGNOSIS — J449 Chronic obstructive pulmonary disease, unspecified: Secondary | ICD-10-CM | POA: Insufficient documentation

## 2011-08-12 ENCOUNTER — Ambulatory Visit
Admission: RE | Admit: 2011-08-12 | Discharge: 2011-08-12 | Disposition: A | Discharge status: Home / Self Care | Payer: Medicare Other | Source: Ambulatory Visit | Attending: Sports Medicine | Admitting: Sports Medicine

## 2011-08-12 ENCOUNTER — Other Ambulatory Visit: Payer: Self-pay | Admitting: Sports Medicine

## 2011-08-12 DIAGNOSIS — M5430 Sciatica, unspecified side: Secondary | ICD-10-CM

## 2011-08-14 LAB — TSH: TSH: 4.871

## 2011-08-14 LAB — POCT CARDIAC MARKERS
CKMB, poc: 1.2
CKMB, poc: <1 — ABNORMAL LOW
Myoglobin, poc: 44.2
Myoglobin, poc: 45.1
Operator id: 196461
Operator id: 196461
Troponin i, poc: <0.05
Troponin i, poc: <0.05

## 2011-08-14 LAB — I-STAT 8, (EC8 V) (CONVERTED LAB)
BUN: 8
Bicarbonate: 22.6
Chloride: 106
Glucose, Bld: 104 — ABNORMAL HIGH
HCT: 45
Hemoglobin: 15.3
Operator id: 196461
Potassium: 4.2
Sodium: 136
TCO2: 24
pCO2, Ven: 30.9 — ABNORMAL LOW
pH, Ven: 7.473 — ABNORMAL HIGH

## 2011-08-14 LAB — CBC
HCT: 41.5
HCT: 43.6
Hemoglobin: 14.3
Hemoglobin: 14.8
MCV: 92.2
Platelets: 213
Platelets: 225
RBC: 4.52
RBC: 4.72
RDW: 13.3
WBC: 7.2
WBC: 7.2
WBC: 7.3

## 2011-08-14 LAB — APTT
aPTT: 32
aPTT: 34

## 2011-08-14 LAB — COMPREHENSIVE METABOLIC PANEL
AST: 25
Albumin: 3.6
BUN: 8
Calcium: 9.1
Chloride: 104
Creatinine, Ser: 1.01
GFR calc Af Amer: >60
Total Bilirubin: 0.9
Total Protein: 6.5

## 2011-08-14 LAB — DIFFERENTIAL
Eosinophils Relative: 6 — ABNORMAL HIGH
Lymphocytes Relative: 35
Lymphs Abs: 2.5
Monocytes Absolute: 0.8
Monocytes Relative: 11
Neutro Abs: 3.5

## 2011-08-14 LAB — CARDIAC PANEL(CRET KIN+CKTOT+MB+TROPI)
CK, MB: 2.8
Relative Index: 2
Relative Index: 2
Total CK: 140
Total CK: 146
Troponin I: 0.01
Troponin I: 0.01

## 2011-08-14 LAB — CK TOTAL AND CKMB (NOT AT ARMC)
CK, MB: 2.7
Relative Index: 1.9

## 2011-08-14 LAB — LIPID PANEL
HDL: 41
Total CHOL/HDL Ratio: 2.9
Triglycerides: 60
VLDL: 12

## 2011-08-14 LAB — TROPONIN I: Troponin I: 0.01

## 2011-08-14 LAB — POCT I-STAT CREATININE
Creatinine, Ser: 1
Operator id: 196461

## 2011-09-13 ENCOUNTER — Emergency Department (HOSPITAL_COMMUNITY): Payer: Medicare Other

## 2011-09-13 ENCOUNTER — Emergency Department (HOSPITAL_COMMUNITY)
Admission: EM | Admit: 2011-09-13 | Discharge: 2011-09-13 | Disposition: A | Discharge status: Home / Self Care | Payer: Medicare Other | Attending: Emergency Medicine | Admitting: Emergency Medicine

## 2011-09-13 DIAGNOSIS — E785 Hyperlipidemia, unspecified: Secondary | ICD-10-CM | POA: Insufficient documentation

## 2011-09-13 DIAGNOSIS — R071 Chest pain on breathing: Secondary | ICD-10-CM | POA: Insufficient documentation

## 2011-09-13 DIAGNOSIS — R079 Chest pain, unspecified: Secondary | ICD-10-CM | POA: Insufficient documentation

## 2011-09-13 DIAGNOSIS — Z7982 Long term (current) use of aspirin: Secondary | ICD-10-CM | POA: Insufficient documentation

## 2011-09-13 DIAGNOSIS — J449 Chronic obstructive pulmonary disease, unspecified: Secondary | ICD-10-CM | POA: Insufficient documentation

## 2011-09-13 DIAGNOSIS — J4489 Other specified chronic obstructive pulmonary disease: Secondary | ICD-10-CM | POA: Insufficient documentation

## 2011-09-13 DIAGNOSIS — Z79899 Other long term (current) drug therapy: Secondary | ICD-10-CM | POA: Insufficient documentation

## 2011-09-13 DIAGNOSIS — I2510 Atherosclerotic heart disease of native coronary artery without angina pectoris: Secondary | ICD-10-CM | POA: Insufficient documentation

## 2011-09-13 LAB — URINALYSIS, ROUTINE W REFLEX MICROSCOPIC
Bilirubin Urine: NEGATIVE
Protein, ur: NEGATIVE mg/dL
Urobilinogen, UA: 0.2 mg/dL (ref 0.0–1.0)

## 2011-09-13 LAB — CBC
Hemoglobin: 13.9 g/dL (ref 13.0–17.0)
RBC: 4.4 MIL/uL (ref 4.22–5.81)
WBC: 8 10*3/uL (ref 4.0–10.5)

## 2011-09-13 LAB — BASIC METABOLIC PANEL
CO2: 28 mEq/L (ref 19–32)
GFR calc non Af Amer: >90 mL/min (ref >90)
Glucose, Bld: 159 mg/dL — ABNORMAL HIGH (ref 70–99)
Potassium: 3.8 mEq/L (ref 3.5–5.1)
Sodium: 138 mEq/L (ref 135–145)

## 2011-09-13 LAB — POCT I-STAT TROPONIN I: Troponin i, poc: 0 ng/mL (ref 0.00–0.08)

## 2012-02-12 ENCOUNTER — Ambulatory Visit: Payer: Medicare Other | Attending: Orthopaedic Surgery | Admitting: Physical Therapy

## 2012-02-12 DIAGNOSIS — IMO0001 Reserved for inherently not codable concepts without codable children: Secondary | ICD-10-CM | POA: Insufficient documentation

## 2012-02-12 DIAGNOSIS — M542 Cervicalgia: Secondary | ICD-10-CM | POA: Insufficient documentation

## 2012-02-12 DIAGNOSIS — R293 Abnormal posture: Secondary | ICD-10-CM | POA: Insufficient documentation

## 2012-02-12 DIAGNOSIS — M2569 Stiffness of other specified joint, not elsewhere classified: Secondary | ICD-10-CM | POA: Insufficient documentation

## 2012-02-16 ENCOUNTER — Ambulatory Visit: Payer: Medicare Other | Admitting: Rehabilitative and Restorative Service Providers"

## 2012-02-17 ENCOUNTER — Ambulatory Visit: Payer: Medicare Other | Admitting: Physical Therapy

## 2013-06-05 ENCOUNTER — Ambulatory Visit: Payer: Medicare Other | Admitting: Neurology

## 2013-06-22 ENCOUNTER — Other Ambulatory Visit: Payer: Self-pay | Admitting: Cardiovascular Disease

## 2013-06-23 NOTE — Telephone Encounter (Signed)
Rx was sent to pharmacy electronically. 

## 2013-06-27 ENCOUNTER — Ambulatory Visit (INDEPENDENT_AMBULATORY_CARE_PROVIDER_SITE_OTHER): Payer: Medicare Other | Admitting: Neurology

## 2013-06-27 ENCOUNTER — Encounter: Payer: Self-pay | Admitting: Neurology

## 2013-06-27 VITALS — BP 107/68 | HR 83 | Temp 98.4°F | Ht 68.0 in | Wt 177.0 lb

## 2013-06-27 DIAGNOSIS — R413 Other amnesia: Secondary | ICD-10-CM

## 2013-06-27 DIAGNOSIS — F411 Generalized anxiety disorder: Secondary | ICD-10-CM

## 2013-06-27 DIAGNOSIS — Z818 Family history of other mental and behavioral disorders: Secondary | ICD-10-CM

## 2013-06-27 DIAGNOSIS — M1990 Unspecified osteoarthritis, unspecified site: Secondary | ICD-10-CM

## 2013-06-27 DIAGNOSIS — M129 Arthropathy, unspecified: Secondary | ICD-10-CM

## 2013-06-27 DIAGNOSIS — I259 Chronic ischemic heart disease, unspecified: Secondary | ICD-10-CM

## 2013-06-27 DIAGNOSIS — Z820 Family history of epilepsy and other diseases of the nervous system: Secondary | ICD-10-CM

## 2013-06-27 NOTE — Progress Notes (Signed)
Subjective:    Patient ID: Jose Leonard is a 62 y.o. male.  HPI  Dear Dr. Juleen China,   I saw your patient, Jose Leonard, upon your kind request in my neurologic clinic today for initial consultation of his memory loss. The patient is accompanied by his wife and his young GS today in the earlier part of his visit, but his family left, as the child became very restless. Prior to leaving with the child his wife, reports, that he has become more irritable, more withdrawn, and stays more angry and she reports, that he paces a lot. He reports, that he has nothing to do and wants to work. As you know, Jose Leonard is a very pleasant 62 year old right-handed gentleman with an underlying medical history of hyperlipidemia, BPH, heart disease status post bypass surgery, chronic lung disease, who has been experiencing memory loss for the past several months, perhaps a year. This particularly apparent with short-term memory problems. He has had forgetfulness, he has gotten lost driving, and is noted to ask questions over and over. After his wife left, he did confide, that his wife has been quite easily irritated at him lately and his children don't show the respect and patience they should, and that causes a lot of friction. He feels, that his wife and step daughter and unjustified in being so critical of him. He has had some loss of appetite and has lost some weight.  Up until last week, it was 6 people in the house, which includes the patient, his wife, his stepdaughter and her husband and their 2 little children (8 yo and 3 mo). This caused a lot of stress as well and they stayed with them a few months, but recently moved out. There are other stressors, including with his wife's ex-husband. The patient has 2 biological children, son 55 and daughter age 30, both local and with good relationship with them.  He quit working as a Merchandiser, retail in grocery store for many years and got on disability after he got hurt at  work. He has not worked in 5 years. He denies any delusions or hallucinations. He has had recent blood work through you with a unremarkable CBC and urinalysis as well as a CMP in March. His current medications include Niaspan, albuterol, Spiriva, Ranexa, Advair, visual, multivitamin, baby aspirin, Plavix, isosorbide mononitrate, Vytorin, metoprolol. He has had issues with arthritis and has had rotator cuff surgery on both UEs. He had a TSH and thyroid function test on 06/01/13, which I reviewed and it was normal. He endorses some anxiety, but no depression, and denies SI/HI/VH/AH, or delusions.  Review of Systems  Constitutional: Positive for chills, activity change and appetite change.  Respiratory:       Soringn  Cardiovascular: Positive for palpitations.  Endocrine: Positive for cold intolerance.  Musculoskeletal: Positive for arthralgias.  Skin: Positive for rash.  Neurological: Positive for headaches.       Memory loss, restless leg  Hematological: Bruises/bleeds easily.  Psychiatric/Behavioral: Positive for confusion and dysphoric mood. The patient is nervous/anxious.        Sleepiness   Objective:  Neurologic Exam  Physical Exam Physical Examination:   Filed Vitals:   06/27/13 1005  BP: 107/68  Pulse: 83  Temp: 98.4 F (36.9 C)    General Examination: The patient is a very pleasant 62 y.o. male in no acute distress. He is calm and cooperative with the exam. He denies Auditory Hallucinations and Visual Hallucinations.   HEENT: Normocephalic,  atraumatic, pupils are equal, round and reactive to light and accommodation. Extraocular tracking shows mild saccadic breakdown without nystagmus noted. Hearing is intact. Tympanic membranes are clear bilaterally. Face is symmetric with no facial masking and normal facial sensation. There is no lip, neck or jaw tremor. Neck is not rigid with intact passive ROM. There are no carotid bruits on auscultation. Oropharynx exam reveals mild mouth  dryness. No significant airway crowding is noted. Mallampati is class II. Tongue protrudes centrally and palate elevates symmetrically.    Chest: is clear to auscultation without wheezing, rhonchi or crackles noted.  Heart: sounds are regular and normal without murmurs, rubs or gallops noted.   Abdomen: is soft, non-tender and non-distended with normal bowel sounds appreciated on auscultation.  Extremities: There is no pitting edema in the distal lower extremities bilaterally. Pedal pulses are intact.  Skin: is warm and dry with no trophic changes noted.  Musculoskeletal: exam reveals no obvious joint deformities, tenderness or joint swelling or erythema.  Neurologically:  Mental status: The patient is awake and alert, paying good  attention. He is able to completely provide the history. His wife provides details and returns a little later to conclude the appointment. He is oriented to: person, place, time/date, situation, day of week, month of year and year. His memory, attention, language and knowledge are fairly well intact. There is no aphasia, agnosia, apraxia or anomia. There is a no degree of bradyphrenia. Speech is not hypophonic with no dysarthria noted. Mood is congruent and affect is blunted mildly.  His MMSE score is 29/30. CDT is 4/4. AFT (Animal Fluency Test) score is 12.   Cranial nerves are as described above under HEENT exam. In addition, shoulder shrug is normal with equal shoulder height noted.  Motor exam: Normal bulk, and strength for age is noted. Tone is not rigid with absence of cogwheeling. There is overall no bradykinesia. There is no drift or rebound. There is no tremor.   Romberg is negative. Reflexes are 2+ in the upper extremities and 2+ in the lower extremities. Toes are downgoing bilaterally. Fine motor skills: Finger taps, hand movements, and rapid alternating patting are not impaired bilaterally. Foot taps and foot agility are not impaired bilaterally.    Cerebellar testing shows no dysmetria or intention tremor on finger to nose testing. Heel to shin is unremarkable. There is no truncal or gait ataxia.   Sensory exam is intact to light touch, pinprick, vibration, temperature sense and proprioception in the upper and lower extremities.   Gait, station and balance: He stands up from the seated position with no difficulty. No veering to one side is noted. No leaning to one side. Posture is age appropriate.  Stance is narrow-based. He turns en bloc. Tandem walk is not possible. Balance is preserved.     Assessment and Plan:   Assessment and Plan:  In summary, Jose Leonard is a very pleasant 62 y.o.-year old male with an underlying medical history of hyperlipidemia, BPH, heart disease status post bypass surgery, chronic lung disease, and arthritis who has a 1 year history of memory loss. His history and physical exam are keeping with mild memory loss. He scores rather well on MMSE testing today and I do believe that psychosocial stressors or in part at play here. He does endorse some anxiety but no frank depression. He is at risk for vascular dementia but given his history. His father had heart disease and developed dementia in his late 26s and lived to be  82. I would like to proceed with further testing in the form of brain MRI, carotid Doppler studies, blood work, and formal neuropsychological evaluation. I made a referral in that regard. I had a long chat with the patient and his wife about my findings and the diagnosis of memory loss and dementia, its prognosis and treatment options. Implications of diagnosis explained at length with the patient and caregiver.. We talked about medical treatments and non-pharmacological approaches. We talked about maintaining a healthy lifestyle in general and staying active mentally and physically. I encouraged the patient to eat healthy, exercise daily and keep well hydrated, to keep a scheduled bedtime and wake  time routine, to not skip any meals and eat healthy snacks in between meals and to have protein with every meal. I stressed the importance of regular exercise, within of course the patient's own mobility limitations. I encouraged the patient to keep up with current events by reading the news paper or watching the news. As far as medications are concerned, I recommended the following at this time: no new medication as yet.  I answered all their questions today and the patient and his wife were in agreement with the above outlined plan. I would like to see the patient back in 3 months, sooner if the need arises and encouraged them to call with any interim questions, concerns, problems, updates and test results.   Thank you very much for allowing me to participate in the care of this nice patient. If I can be of any further assistance to you please do not hesitate to call me at [phone].  Sincerely,   Huston Foley, MD, PhD

## 2013-06-27 NOTE — Patient Instructions (Addendum)
I think overall you are doing fairly well but I do want to suggest a few things today:  Remember to drink plenty of fluid, eat healthy meals and do not skip any meals. Try to eat protein with a every meal and eat a healthy snack such as fruit or nuts in between meals. Try to keep a regular sleep-wake schedule and try to exercise daily, particularly in the form of walking, 20-30 minutes a day, if you can.   Engage in social activities in your community and with your family and try to keep up with current events by reading the newspaper or watching the news.   As far as your medications are concerned, I would like to suggest  No new meds.   As far as diagnostic testing: MRI brain, Ultrasound neck arteries, blood work, formal cognitive testing.  I would like to see you back in 3 months, sooner if we need to. Please call us with any interim questions, concerns, problems, updates or refill requests.  Please also call us for any test results so we can go over those with you on the phone. Brett Canales is my clinical assistant and will answer any of your questions and relay your messages to me and also relay most of my messages to you.  Our phone number is [phone]. We also have an after hours call service for urgent matters and there is a physician on-call for urgent questions. For any emergencies you know to call 911 or go to the nearest emergency room.

## 2013-06-28 LAB — HGB A1C W/O EAG: Hgb A1c MFr Bld: 5.9 % — ABNORMAL HIGH (ref 4.8–5.6)

## 2013-06-28 LAB — IFE AND PE, SERUM
Albumin SerPl Elph-Mcnc: 3.9 g/dL (ref 3.2–5.6)
Alpha 1: 0.2 g/dL (ref 0.1–0.4)
Gamma Glob SerPl Elph-Mcnc: 1.2 g/dL (ref 0.5–1.6)
IgA/Immunoglobulin A, Serum: 221 mg/dL (ref 91–414)
IgG (Immunoglobin G), Serum: 1142 mg/dL (ref 700–1600)
IgM (Immunoglobulin M), Srm: 119 mg/dL (ref 40–230)

## 2013-06-28 LAB — VITAMIN D 25 HYDROXY (VIT D DEFICIENCY, FRACTURES): Vit D, 25-Hydroxy: 33.7 ng/mL (ref 30.0–100.0)

## 2013-06-28 LAB — COMPREHENSIVE METABOLIC PANEL
Albumin: 4.4 g/dL (ref 3.6–4.8)
Alkaline Phosphatase: 61 IU/L (ref 39–117)
BUN/Creatinine Ratio: 11 (ref 10–22)
BUN: 12 mg/dL (ref 8–27)
CO2: 26 mmol/L (ref 18–29)
Creatinine, Ser: 1.1 mg/dL (ref 0.76–1.27)
Globulin, Total: 2.3 g/dL (ref 1.5–4.5)

## 2013-06-28 LAB — C-REACTIVE PROTEIN: CRP: 0.3 mg/L (ref 0.0–4.9)

## 2013-06-29 NOTE — Progress Notes (Signed)
Quick Note:  Please call patient and advised him that his recent labs showed normal findings including normal vitamin D level, normal inflammatory markers, normal liver and kidney function, the only thing that was borderline elevated was his diabetes marker which is called hemoglobin A1c. This underlying see important of good blood glucose control. Otherwise no further action is required on these tests at this time. Huston Foley, MD, PhD [facility]  ______

## 2013-06-30 ENCOUNTER — Telehealth: Payer: Self-pay

## 2013-06-30 NOTE — Telephone Encounter (Signed)
Message copied by [facility] on Fri Jun 30, 2013  3:56 PM ------      Message from: Huston Foley      Created: Thu Jun 29, 2013  5:29 PM       Please call patient and advised him that his recent labs showed normal findings including normal vitamin D level, normal inflammatory markers, normal liver and kidney function, the only thing that was borderline elevated was his diabetes marker which is called hemoglobin A1c. This underlying see important of good blood glucose control. Otherwise no further action is required on these tests at this time.      Huston Foley, MD, PhD      [facility])       ------

## 2013-06-30 NOTE — Telephone Encounter (Signed)
I called patient and let him know the results of his lab work. I discussed his Hemoglobin A1C, need to manage weight by exercising, reducing carbohydrates (white breads, pastas, sugars) and keep track of what you eat for a week so you can look back at it and see what you can eliminate from diet, to start.

## 2013-07-04 ENCOUNTER — Ambulatory Visit (INDEPENDENT_AMBULATORY_CARE_PROVIDER_SITE_OTHER): Payer: Medicare Other

## 2013-07-04 DIAGNOSIS — M1990 Unspecified osteoarthritis, unspecified site: Secondary | ICD-10-CM

## 2013-07-04 DIAGNOSIS — R0989 Other specified symptoms and signs involving the circulatory and respiratory systems: Secondary | ICD-10-CM

## 2013-07-04 DIAGNOSIS — Z818 Family history of other mental and behavioral disorders: Secondary | ICD-10-CM

## 2013-07-04 DIAGNOSIS — R413 Other amnesia: Secondary | ICD-10-CM

## 2013-07-04 DIAGNOSIS — I259 Chronic ischemic heart disease, unspecified: Secondary | ICD-10-CM

## 2013-07-25 ENCOUNTER — Telehealth: Payer: Self-pay | Admitting: Neurology

## 2013-07-25 NOTE — Telephone Encounter (Signed)
Patient has appointment at 9:30 a.m. this Saturday. They will try to reschedule for sooner. Please review and order something for this patient for claustorphobia and anxiety. Thank you.Marland Kitchen

## 2013-07-26 ENCOUNTER — Telehealth: Payer: Self-pay | Admitting: Neurology

## 2013-07-26 DIAGNOSIS — R413 Other amnesia: Secondary | ICD-10-CM

## 2013-07-26 NOTE — Telephone Encounter (Signed)
Shanda Bumps: can you put in Rx for Xanax 0.5 mg take 1-2 pills prior to MRI, may take a third pill if necessary #3, no refills, thx

## 2013-07-26 NOTE — Telephone Encounter (Signed)
I called the patient to verify pharmacy info.  He advised me he does not want any medication.  Says he has had MRI's before, and he "will be fine".  Says his wife is always the one insisting he get meds.  I told him we can call Rx if needed, again he declined.

## 2013-07-26 NOTE — Telephone Encounter (Signed)
Duplicate message. See previous note  

## 2013-07-28 ENCOUNTER — Other Ambulatory Visit: Payer: Self-pay | Admitting: Diagnostic Neuroimaging

## 2013-07-28 DIAGNOSIS — R413 Other amnesia: Secondary | ICD-10-CM

## 2013-07-28 DIAGNOSIS — F411 Generalized anxiety disorder: Secondary | ICD-10-CM

## 2013-07-28 DIAGNOSIS — Z818 Family history of other mental and behavioral disorders: Secondary | ICD-10-CM

## 2013-07-28 DIAGNOSIS — I259 Chronic ischemic heart disease, unspecified: Secondary | ICD-10-CM

## 2013-07-28 DIAGNOSIS — M1990 Unspecified osteoarthritis, unspecified site: Secondary | ICD-10-CM

## 2013-09-25 ENCOUNTER — Encounter: Payer: Self-pay | Admitting: Neurology

## 2013-09-25 ENCOUNTER — Ambulatory Visit (INDEPENDENT_AMBULATORY_CARE_PROVIDER_SITE_OTHER): Payer: Medicare Other | Admitting: Neurology

## 2013-09-25 VITALS — BP 105/63 | HR 68 | Temp 98.5°F | Ht 68.0 in | Wt 178.0 lb

## 2013-09-25 DIAGNOSIS — F411 Generalized anxiety disorder: Secondary | ICD-10-CM

## 2013-09-25 DIAGNOSIS — Z818 Family history of other mental and behavioral disorders: Secondary | ICD-10-CM

## 2013-09-25 DIAGNOSIS — M1990 Unspecified osteoarthritis, unspecified site: Secondary | ICD-10-CM

## 2013-09-25 DIAGNOSIS — M129 Arthropathy, unspecified: Secondary | ICD-10-CM

## 2013-09-25 DIAGNOSIS — R413 Other amnesia: Secondary | ICD-10-CM

## 2013-09-25 DIAGNOSIS — I259 Chronic ischemic heart disease, unspecified: Secondary | ICD-10-CM

## 2013-09-25 DIAGNOSIS — Z820 Family history of epilepsy and other diseases of the nervous system: Secondary | ICD-10-CM

## 2013-09-25 NOTE — Progress Notes (Addendum)
Subjective:    Patient ID: Jose Leonard is a 62 y.o. male.  HPI  Interim history:   Jose Leonard is a very pleasant 62 year old right-handed gentleman with an underlying medical history of hyperlipidemia, BPH, heart disease status post bypass surgery, chronic lung disease, who presents for followup consultation of his memory loss. He is accompanied by his wife again today. I first met him on 06/27/2013, at which time I suggested blood work, MRI brain, carotid Doppler studies, and formal neuropsychological evaluation for which I made a referral. We called him back about his blood work which showed normal vitamin D, normal liver and kidney function, borderline diabetes marker, normal ANA, normal CRP, normal immune fluorescent electrophoresis, normal RPR and normal rheumatoid factor as well as normal B12 and folate. Carotid Doppler study from 07/04/2013 showed no significant stenosis in either internal carotid arteries, MRI brain without contrast from 07/28/2013 showed moderate anterior and mesial temporal atrophy, small amount of left periventricular and subcortical foci of non-specific gliosis and no acute findings. He did not have cognitive testing done and wants to hold off. He wants to get back into the work force part time. He feels stable. He may start volunteering at church. His step daughter and her family moved back after about a month.   Has been experiencing memory loss for the past several months, perhaps a year. This particularly apparent with short-term memory problems. He has had forgetfulness, he has gotten lost driving, and is noted to ask questions over and over. After his wife left, he did confide, that his wife has been quite easily irritated at him lately and his children don't show the respect and patience they should, and that that has been causing a lot of friction. He also felt that his wife and stepdaughter were 2 critical of him. He endorses some appetite and weight loss. He  also endorsed distress with respect to having multiple family members in the house, including himself and his wife, his stepdaughter and her husband and their 2 little children. However his stepdaughter and her family recently moved out. There were some additional stressors with respect to his wife's ex-husband. He himself has 2 biological children, a son, age 53 and a daughter, age 77 with whom he has a good relationship.  He quit working as a Merchandiser, retail in grocery store for many years and got on disability after he got hurt at work. He has not worked in 5 years and is frustrated over this.  He denies any delusions or hallucinations. He has had recent blood work through you with a unremarkable CBC and urinalysis as well as a CMP in March. His current medications include Niaspan, albuterol, Spiriva, Ranexa, Advair, visual, multivitamin, baby aspirin, Plavix, isosorbide mononitrate, Vytorin, metoprolol.  He has had issues with arthritis and has had rotator cuff surgery on both UEs. He had a TSH and thyroid function test on 06/01/13, which I reviewed and it was normal. He endorses some anxiety, but no depression, and denies SI/HI/VH/AH, or delusions.  His Past Medical History Is Significant For: Past Medical History  Diagnosis Date  . Hyperlipidemia   . BPH (benign prostatic hyperplasia)   . Lung disease     chronic  . Memory loss   . Anxiety state, unspecified   . Arthropathy, unspecified, site unspecified   . Chronic ischemic heart disease, unspecified   . Family history of other neurological diseases     His Past Surgical History Is Significant For: Past Surgical History  Procedure Laterality Date  . Angioplasty    . Rotator cuff repair Bilateral     His Family History Is Significant For: Family History  Problem Relation Age of Onset  . Coronary artery disease Father   . Dementia Father   . Dementia Mother     His Social History Is Significant For: History   Social History  .  Marital Status: Married    Spouse Name: N/A    Number of Children: 2  . Years of Education: N/A   Occupational History  . disabled    Social History Main Topics  . Smoking status: Former Smoker    Quit date: 06/28/1983  . Smokeless tobacco: None  . Alcohol Use: 0.5 oz/week    1 drink(s) per week  . Drug Use: None  . Sexual Activity: None   Other Topics Concern  . None   Social History Narrative  . None    His Allergies Are:  Allergies  Allergen Reactions  . Penicillins   :   His Current Medications Are:  Outpatient Encounter Prescriptions as of 09/25/2013  Medication Sig  . ALBUTEROL SULFATE PO Take by mouth.  Marland Kitchen aspirin 81 MG tablet Take 81 mg by mouth daily.  . clopidogrel (PLAVIX) 75 MG tablet Take 0.5 tablets by mouth daily.  . cyclobenzaprine (FLEXERIL) 10 MG tablet   . escitalopram (LEXAPRO) 10 MG tablet   . fish oil-omega-3 fatty acids 1000 MG capsule Take 2 g by mouth daily.  . Fluticasone-Salmeterol (ADVAIR DISKUS) 100-50 MCG/DOSE AEPB Inhale 1 puff into the lungs every 12 (twelve) hours.  . isosorbide mononitrate (IMDUR) 60 MG 24 hr tablet   . METOPROLOL TARTRATE PO Take 25 mg by mouth daily.  . Multiple Vitamins-Minerals (MULTIVITAMIN PO) Take 1 tablet by mouth daily.  . niacin (NIASPAN) 1000 MG CR tablet   . RANEXA 500 MG 12 hr tablet TAKE ONE TABLET BY MOUTH TWICE DAILY  . tamsulosin (FLOMAX) 0.4 MG CAPS capsule   . tiotropium (SPIRIVA) 18 MCG inhalation capsule Place 18 mcg into inhaler and inhale daily.  Marland Kitchen VYTORIN 10-40 MG per tablet   . alfuzosin (UROXATRAL) 10 MG 24 hr tablet Take 1 tablet by mouth daily.  :  Review of Systems:  Out of a complete 14 point review of systems, all are reviewed and negative with the exception of these symptoms as listed below:   Review of Systems  Constitutional: Positive for chills, appetite change and unexpected weight change (loss).  HENT: Negative.   Eyes: Negative.   Respiratory: Negative.   Cardiovascular:  Negative.   Gastrointestinal: Negative.   Endocrine: Positive for cold intolerance.  Genitourinary: Negative.   Musculoskeletal: Negative.   Skin: Negative.   Allergic/Immunologic: Negative.   Neurological: Negative.   Hematological: Bruises/bleeds easily.  Psychiatric/Behavioral: Negative.     Objective:  Neurologic Exam  Physical Exam Physical Examination:   Filed Vitals:   09/25/13 1148  BP: 105/63  Pulse: 68  Temp: 98.5 F (36.9 C)    General Examination: The patient is a very pleasant 62 y.o. male in no acute distress. He is calm and cooperative with the exam. He denies Auditory Hallucinations and Visual Hallucinations.   HEENT: Normocephalic, atraumatic, pupils are equal, round and reactive to light and accommodation. Extraocular tracking shows mild saccadic breakdown without nystagmus noted. Hearing is intact. Tympanic membranes are clear bilaterally. Face is symmetric with no facial masking and normal facial sensation. There is no lip, neck or jaw tremor. Neck is not  rigid with intact passive ROM. There are no carotid bruits on auscultation. Oropharynx exam reveals mild mouth dryness. No significant airway crowding is noted. Mallampati is class II. Tongue protrudes centrally and palate elevates symmetrically.    Chest: is clear to auscultation without wheezing, rhonchi or crackles noted.  Heart: sounds are regular and normal without murmurs, rubs or gallops noted.   Abdomen: is soft, non-tender and non-distended with normal bowel sounds appreciated on auscultation.  Extremities: There is no pitting edema in the distal lower extremities bilaterally. Pedal pulses are intact.  Skin: is warm and dry with no trophic changes noted.  Musculoskeletal: exam reveals no obvious joint deformities, tenderness or joint swelling or erythema.  Neurologically:  Mental status: The patient is awake and alert, paying good  attention. He is able to completely provide the history. His  wife provides details and returns a little later to conclude the appointment. He is oriented to: person, place, time/date, situation, day of week, month of year and year. His memory, attention, language and knowledge are fairly well intact. There is no aphasia, agnosia, apraxia or anomia. There is a no degree of bradyphrenia. Speech is not hypophonic with no dysarthria noted. Mood is congruent and affect is blunted mildly.  On 06/27/13: His MMSE score was 29/30, CDT is 4/4 and his AFT (Animal Fluency Test) score was 12.   Cranial nerves are as described above under HEENT exam. In addition, shoulder shrug is normal with equal shoulder height noted.  Motor exam: Normal bulk, and strength for age is noted. Tone is not rigid with absence of cogwheeling. There is overall no bradykinesia. There is no drift or rebound. There is no tremor.   Romberg is negative. Reflexes are 2+ in the upper extremities and 2+ in the lower extremities. Toes are downgoing bilaterally. Fine motor skills: Finger taps, hand movements, and rapid alternating patting are not impaired bilaterally. Foot taps and foot agility are not impaired bilaterally.   Cerebellar testing shows no dysmetria or intention tremor on finger to nose testing. Heel to shin is unremarkable. There is no truncal or gait ataxia.   Sensory exam is intact to light touch, pinprick, vibration, temperature sense and proprioception in the upper and lower extremities.   Gait, station and balance: He stands up from the seated position with no difficulty. No veering to one side is noted. No leaning to one side. Posture is age appropriate.  Stance is narrow-based. He turns en bloc. Tandem walk is not possible. Balance is preserved.     Assessment and Plan:   In summary, Jose Leonard is a very pleasant 62 year old male with an underlying medical history of hyperlipidemia, BPH, heart disease status post bypass surgery, chronic lung disease, and arthritis who has a 1+  year history of memory loss. His history and physical exam are keeping with mild memory loss. In August 2014, his MMSE was 29/30, his CDT was 4/4 and his AFT was 12/minute. I believe that psychosocial stressors are in part at play here. He does endorse some anxiety but no frank depression. He also endorses some marital stress. Furthermore, his stepdaughter and her family moved out briefly for about a month then moved back in. His son-in-law has found a new job and they will most likely move out by February of next year. He is very fond of his grandchildren but there is a lot of noise and stress in the house car and lay. I do believe that he is at risk  for vascular dementia, given his history and his family history of heart disease. Both parents apparently developed dementia. His father had heart disease and developed dementia in his late 95s and lived to be 6. He would like to hold off on formal memory testing. We did those for blood work, which was fine for the most part, a brain MRI, which did demonstrate some atrophy, and carotid Doppler studies, which did not show any significant stenosis. He has remained stable. I encouraged him to continue to stay active mentally and physically. We talked about maintaining a healthy lifestyle in general and staying active mentally and physically. I encouraged the patient to eat healthy, exercise daily and keep well hydrated, to keep a scheduled bedtime and wake time routine, to not skip any meals and eat healthy snacks in between meals and to have protein with every meal. I stressed the importance of regular exercise, within of course the patient's own mobility limitations. I encouraged the patient to keep up with current events by reading the news paper or watching the news. As far as medications are concerned, I recommended the following at this time: no new medication as yet.  I answered all their questions today and the patient and his daughter were in agreement with the  above outlined plan. I would like to see the patient back in 6 months, sooner if the need arises and encouraged them to call with any interim questions, concerns, problems, or updates.

## 2013-09-25 NOTE — Patient Instructions (Addendum)
I think overall you are doing fairly well but I do want to suggest a few things today:  Remember to drink plenty of fluid, eat healthy meals and do not skip any meals. Try to eat protein with a every meal and eat a healthy snack such as fruit or nuts in between meals. Try to keep a regular sleep-wake schedule and try to exercise daily, particularly in the form of walking, 20-30 minutes a day, if you can.   Engage in social activities in your community and with your family and try to keep up with current events by reading the newspaper or watching the news. Try to go on [email or web address]  As far as your medications are concerned, I would like to suggest no new medications.    As far as diagnostic testing: no new test today.  I would like to see you back in 6 months, sooner if we need to. Please call us with any interim questions, concerns, problems, updates or refill requests.  Brett Canales is my clinical assistant and will answer any of your questions and relay your messages to me and also relay most of my messages to you.  Our phone number is [phone]. We also have an after hours call service for urgent matters and there is a physician on-call for urgent questions. For any emergencies you know to call 911 or go to the nearest emergency room.

## 2014-02-12 ENCOUNTER — Other Ambulatory Visit: Payer: Self-pay | Admitting: Cardiovascular Disease

## 2014-02-12 NOTE — Telephone Encounter (Signed)
Rx was sent to pharmacy electronically. 

## 2014-02-20 ENCOUNTER — Telehealth (HOSPITAL_COMMUNITY): Payer: Self-pay | Admitting: *Deleted

## 2014-02-20 NOTE — Telephone Encounter (Signed)
Patient's wife returned a call. Informed her that patient will need appointment. Dr. Claiborne Billings will not just restart medications that patient has been off of greater than a year. She voiced understanding and will call tomorrow and schedule appointment.

## 2014-02-20 NOTE — Telephone Encounter (Signed)
Left message to return a call regarding his medication.

## 2014-02-21 ENCOUNTER — Ambulatory Visit: Payer: Medicare Other | Admitting: Internal Medicine

## 2014-03-26 ENCOUNTER — Ambulatory Visit: Payer: Medicare Other | Admitting: Neurology

## 2014-03-27 ENCOUNTER — Encounter: Payer: Self-pay | Admitting: Cardiovascular Disease

## 2014-03-27 ENCOUNTER — Ambulatory Visit (INDEPENDENT_AMBULATORY_CARE_PROVIDER_SITE_OTHER): Payer: Medicare Other | Admitting: Cardiovascular Disease

## 2014-03-27 VITALS — BP 104/76 | HR 74 | Ht 70.0 in | Wt 178.6 lb

## 2014-03-27 DIAGNOSIS — I2510 Atherosclerotic heart disease of native coronary artery without angina pectoris: Secondary | ICD-10-CM

## 2014-03-27 DIAGNOSIS — E7800 Pure hypercholesterolemia, unspecified: Secondary | ICD-10-CM

## 2014-03-27 DIAGNOSIS — I10 Essential (primary) hypertension: Secondary | ICD-10-CM

## 2014-03-27 DIAGNOSIS — F411 Generalized anxiety disorder: Secondary | ICD-10-CM

## 2014-03-27 MED ORDER — NITROGLYCERIN 0.4 MG/SPRAY TL SOLN: 1.0000 | Status: DC | PRN

## 2014-03-27 NOTE — Patient Instructions (Signed)
Your physician recommends that you schedule a follow-up appointment in: 1 year. No changes were made today in your therapy. 

## 2014-03-28 ENCOUNTER — Ambulatory Visit: Payer: Self-pay | Admitting: Neurology

## 2014-04-02 ENCOUNTER — Ambulatory Visit (INDEPENDENT_AMBULATORY_CARE_PROVIDER_SITE_OTHER): Payer: Medicare Other | Admitting: Neurology

## 2014-04-02 ENCOUNTER — Encounter: Payer: Self-pay | Admitting: Neurology

## 2014-04-02 VITALS — BP 119/75 | HR 66 | Temp 98.6°F | Ht 70.0 in | Wt 180.0 lb

## 2014-04-02 DIAGNOSIS — M129 Arthropathy, unspecified: Secondary | ICD-10-CM

## 2014-04-02 DIAGNOSIS — R413 Other amnesia: Secondary | ICD-10-CM

## 2014-04-02 DIAGNOSIS — I259 Chronic ischemic heart disease, unspecified: Secondary | ICD-10-CM

## 2014-04-02 DIAGNOSIS — Z820 Family history of epilepsy and other diseases of the nervous system: Secondary | ICD-10-CM

## 2014-04-02 DIAGNOSIS — Z818 Family history of other mental and behavioral disorders: Secondary | ICD-10-CM

## 2014-04-02 DIAGNOSIS — M1990 Unspecified osteoarthritis, unspecified site: Secondary | ICD-10-CM

## 2014-04-02 DIAGNOSIS — F411 Generalized anxiety disorder: Secondary | ICD-10-CM

## 2014-04-02 NOTE — Patient Instructions (Addendum)
We will go ahead a pursue the formal memory testing.  Please watch the news and do word puzzles. Try to get out more.

## 2014-04-02 NOTE — Progress Notes (Signed)
Subjective:    Patient ID: Jose Leonard is a 63 y.o. male.  HPI    Interim history:   [name] is a very pleasant 63 year old right-handed gentleman with an underlying medical history of hyperlipidemia, BPH, heart disease status post bypass surgery, chronic lung disease, who presents for followup consultation of his memory loss. He is accompanied by his wife again today. I last saw him on 09/25/2013, at which time he reported not have cognitive testing done and wanted to hold off. He wanted to get back into the work force part time. He felt stable. He wanted to start volunteering at church. His step daughter and her family had moved back after about a month. We talked about his 1+ year history of memory loss. I felt he had mild memory loss with confounding psychosocial stressors. He does have a family history of memory loss. We talked about are our workup thus far. Given his vascular risk factors I felt he was at risk for vascular dementia. She did not start any new medication for her memory at the time and I suggested a six-month reevaluation.  Today, he reports feeling stable. But his wife reports that his memory has become worse. He is more irritable and they have had some marital discord. His step daughter moved out a couple of months ago. He has been diagnoses with low testosterone and has been on testosterone gel since 03/13/14. He has not been on metoprolol since 10/13 and per Dr. Claiborne Billings it is okay for him to stay off. He has been on Lexapro 10 mg daily. His balance has been worse. He would like to work. He has not been volunteering at [employer]. He has less appetite. She had to restrict his beer consumption. He has less motivation and initiative. He saw Dr. Wilson Singer recently. He feels cold a lot. He has R hip pain and attributes it to his mattress. He will be getting a new mattress.   I first met him on 06/27/2013, at which time I suggested blood work, MRI brain, carotid Doppler studies, and  formal neuropsychological evaluation for which I made a referral. We called him back about his blood work which showed normal vitamin D, normal liver and kidney function, borderline diabetes marker, normal ANA, normal CRP, normal immune fluorescent electrophoresis, normal RPR and normal rheumatoid factor as well as normal B12 and folate. Carotid Doppler study from 07/04/2013 showed no significant stenosis in either internal carotid arteries, MRI brain without contrast from 07/28/2013 showed moderate anterior and mesial temporal atrophy, small amount of left periventricular and subcortical foci of non-specific gliosis and no acute findings.   Has been experiencing memory loss for the past year or longer, more with short-term memory problems. He has had forgetfulness, he has gotten lost driving, and is noted to ask questions over and over. After his wife left, he did confide, that his wife has been quite easily irritated at him lately and his children don't show the respect and patience they should, and that that has been causing a lot of friction. He also felt that his wife and stepdaughter were 2 critical of him. He endorses some appetite and weight loss. He also endorsed distress with respect to having multiple family members in the house, including himself and his wife, his 37 and her husband and their 2 little children. However his stepdaughter and her family recently moved out. There were some additional stressors with respect to his wife's ex-husband. He himself has 2 biological children, a  son, age 65 and a daughter, age 59 with whom he has a good relationship.  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 He has not worked in 5 years and is frustrated over this. He denied any delusions or hallucinations. He has had issues with arthritis and has had rotator cuff surgery on both UEs. He had a TSH and thyroid function test on  06/01/13, which I reviewed and it was normal. He endorsed some anxiety, but no depression, and denied SI/HI.   His Past Medical History Is Significant For: Past Medical History  Diagnosis Date  . Hyperlipidemia   . BPH (benign prostatic hyperplasia)   . Lung disease     chronic  . Memory loss   . Anxiety state, unspecified   . Arthropathy, unspecified, site unspecified   . Chronic ischemic heart disease, unspecified   . Family history of other neurological diseases     His Past Surgical History Is Significant For: Past Surgical History  Procedure Laterality Date  . Angioplasty    . Rotator cuff repair Bilateral     His Family History Is Significant For: Family History  Problem Relation Age of Onset  . Coronary artery disease Father   . Dementia Father   . Dementia Mother     His Social History Is Significant For: History   Social History  . Marital Status: Married    Spouse Name: N/A    Number of Children: 2  . Years of Education: N/A   Occupational History  . disabled    Social History Main Topics  . Smoking status: Former Smoker    Quit date: 06/28/1983  . Smokeless tobacco: Never Used  . Alcohol Use: 0.5 oz/week    1 drink(s) per week  . Drug Use: None  . Sexual Activity: None   Other Topics Concern  . None   Social History Narrative  . None    His Allergies Are:  Allergies  Allergen Reactions  . Penicillins   :   His Current Medications Are:  Outpatient Encounter Prescriptions as of 04/02/2014  Medication Sig  . alfuzosin (UROXATRAL) 10 MG 24 hr tablet Take 1 tablet by mouth daily.  Marland Kitchen aspirin 81 MG tablet Take 81 mg by mouth daily.  . cyclobenzaprine (FLEXERIL) 10 MG tablet Take 10 mg by mouth as needed.   Marland Kitchen escitalopram (LEXAPRO) 10 MG tablet   . fish oil-omega-3 fatty acids 1000 MG capsule Take 2 g by mouth daily.  . Fluticasone-Salmeterol (ADVAIR DISKUS) 100-50 MCG/DOSE AEPB Inhale 1 puff into the lungs every 12 (twelve) hours.  .  isosorbide mononitrate (IMDUR) 60 MG 24 hr tablet Take 0.5 tablets (30 mg total) by mouth daily.  . Multiple Vitamins-Minerals (MULTIVITAMIN PO) Take 1 tablet by mouth daily.  . niacin (NIASPAN) 1000 MG CR tablet Take by mouth at bedtime.   . nitroGLYCERIN (NITROLINGUAL) 0.4 MG/SPRAY spray Place 1 spray under the tongue every 5 (five) minutes x 3 doses as needed for chest pain.  Marland Kitchen RANEXA 500 MG 12 hr tablet TAKE ONE TABLET BY MOUTH TWICE DAILY  . Testosterone (ANDROGEL) 20.25 MG/1.25GM (1.62%) GEL Place onto the skin.  Marland Kitchen VYTORIN 10-40 MG per tablet   :  Review of Systems:  Out of a complete 14 point review of systems, all are reviewed and negative with the exception of these symptoms as listed below:  Review of Systems  Constitutional:  Positive for chills and appetite change.  HENT: Positive for rhinorrhea.   Eyes: Negative.   Respiratory: Positive for cough.   Cardiovascular: Positive for chest pain.  Gastrointestinal: Negative.   Endocrine: Positive for cold intolerance and heat intolerance.  Genitourinary: Negative.   Musculoskeletal: Positive for arthralgias, back pain, gait problem and myalgias.  Skin: Negative.   Allergic/Immunologic: Negative.   Neurological: Positive for headaches.  Hematological: Bruises/bleeds easily.  Psychiatric/Behavioral: Positive for sleep disturbance (snoring, restless leg, insomnia).    Objective:  Neurologic Exam  Physical Exam Physical Examination:   Filed Vitals:   04/02/14 1056  BP: 119/75  Pulse: 66  Temp: 98.6 F (37 C)     General Examination: The patient is a very pleasant 63 y.o. male in no acute distress. He is calm and cooperative with the exam. He denies Auditory Hallucinations and Visual Hallucinations.   HEENT: Normocephalic, atraumatic, pupils are equal, round and reactive to light and accommodation. Extraocular tracking shows mild saccadic breakdown without nystagmus noted. Hearing is intact. Tympanic membranes are clear  bilaterally. Face is symmetric with no facial masking and normal facial sensation. There is no lip, neck or jaw tremor. Neck is not rigid with intact passive ROM. There are no carotid bruits on auscultation. Oropharynx exam reveals mild mouth dryness. No significant airway crowding is noted. Mallampati is class II. Tongue protrudes centrally and palate elevates symmetrically.    Chest: is clear to auscultation without wheezing, rhonchi or crackles noted.  Heart: sounds are regular and normal without murmurs, rubs or gallops noted.   Abdomen: is soft, non-tender and non-distended with normal bowel sounds appreciated on auscultation.  Extremities: There is no pitting edema in the distal lower extremities bilaterally. Pedal pulses are intact.  Skin: is warm and dry with no trophic changes noted.  Musculoskeletal: exam reveals no obvious joint deformities, tenderness or joint swelling or erythema, with the exception of R hip pain.  Neurologically:  Mental status: The patient is awake and alert, paying good  attention. He is able to completely provide the history. His wife provides details. He is oriented to: person, place, time/date, situation, day of week, month of year and year. His memory, attention, language and knowledge are fairly well intact. There is no aphasia, agnosia, apraxia or anomia. There is a no degree of bradyphrenia. Speech is not hypophonic with no dysarthria noted. Mood is congruent and affect is blunted mildly.   On 06/27/13: His MMSE score was 29/30, CDT is 4/4 and his AFT (Animal Fluency Test) score was 12.  On 04/02/14: MMSE: 26/30, CDT: 4/4 and AFT: 12.   Cranial nerves are as described above under HEENT exam. In addition, shoulder shrug is normal with equal shoulder height noted.  Motor exam: Normal bulk, and strength for age is noted. Tone is not rigid with absence of cogwheeling. There is overall no bradykinesia. There is no drift or rebound. There is no tremor.   Romberg  is negative. Reflexes are 2+ in the upper extremities and 2+ in the lower extremities. Toes are downgoing bilaterally. Fine motor skills: Finger taps, hand movements, and rapid alternating patting are not impaired bilaterally. Foot taps and foot agility are not impaired bilaterally.   Cerebellar testing shows no dysmetria or intention tremor on finger to nose testing. Heel to shin is unremarkable. There is no truncal or gait ataxia.   Sensory exam is intact to light touch, pinprick, vibration, temperature sense in the upper and lower extremities.   Gait, station and balance:  He stands up from the seated position with no difficulty. No veering to one side is noted. No leaning to one side. Posture is age appropriate.  Stance is narrow-based. He turns en bloc, but walks with a limp d/t R hip pain. Tandem walk is possible. Balance is preserved.    Assessment and Plan:   In summary, EARLE TROIANO is a very pleasant 63 year old male with an underlying medical history of hyperlipidemia, BPH, heart disease status post bypass surgery, chronic lung disease, and arthritis who has a 1+ year history of memory loss. His history and physical exam are keeping with mild memory loss, and while his CDT and AFT are the same, I do see a decline in his MMSE from 29/30 in August 2014 to 26/30 today. I do believe that psychosocial stressors and marital discord are in part at play as well, but it is difficult for me to tease out whether for marital friction is a result or part of the etiology of his memory dysfunction. Nevertheless, we know that he has had some atrophy on his brain MRI last year and we should repeat his MRI later down the road to compare. I do believe he has worsening of his memory issues and suboptimally treated depression and anxiety. I would like to go ahead and request formal memory testing in the form of neurocognitive evaluation with a neuropsychologist. He and his wife are in agreement.  Both parents  apparently developed dementia. His father had heart disease and developed dementia in his late 78s and lived to be 45. I encouraged him to continue to stay active mentally and physically. We talked about maintaining a healthy lifestyle in general and staying active mentally and physically. I encouraged the patient to eat healthy, exercise daily and keep well hydrated, to keep a scheduled bedtime and wake time routine, to not skip any meals and eat healthy snacks in between meals and to have protein with every meal. I stressed the importance of regular exercise, within of course the patient's own mobility limitations. I encouraged the patient to keep up with current events by reading the news paper or watching the news. As far as medications are concerned, I recommended the following at this time: no new medication as yet, but we may initiate medication for dementia soon. I would like to also get more data from his neuropsychological evaluation.  I answered all their questions today and the patient and his wife were in agreement with the above outlined plan. I have asked him to see his primary care physician if his right hip pain gets worse. I would like to see the patient back in 4 months, sooner if the need arises and encouraged them to call with any interim questions, concerns, problems, or updates.

## 2014-04-03 ENCOUNTER — Encounter: Payer: Self-pay | Admitting: Cardiovascular Disease

## 2014-04-03 NOTE — Progress Notes (Signed)
Patient ID: Jose Leonard, male   DOB: [birth year], 63 y.o.   MRN: [id number]     HPI: Jose Leonard is a 63 y.o. male who presents to the office today for a 16  month follow up cardiology evaluation.  Is disease August 2005 underwent CABG surgery with a LIMA to the LAD, a LIMA to the RCA, a vein graft to the diagonal, and vein to the marginal vessel.  He has documented atresia of both arterial conduits.  Ahis last catheterization in January 2010 both vein grafts were patent in the marginal and diagonal vessel that is LIMA to LAD and RIMA to RCA were atretic.  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  He has been on medical therapy.  The addition of Ranexa was significant in his anginal symptomatology.  Over the past year.  He has continued to do well.  He does have a history of hyperlipidemia, as well as a history of back discomfort.  In addition, he takes Lexapro for anxiety disorder and has history of asthma, for which he takes Advair.  He presents for evaluation.  Past Medical History  Diagnosis Date  . Hyperlipidemia   . BPH (benign prostatic hyperplasia)   . Lung disease     chronic  . Memory loss   . Anxiety state, unspecified   . Arthropathy, unspecified, site unspecified   . Chronic ischemic heart disease, unspecified   . Family history of other neurological diseases     Past Surgical History  Procedure Laterality Date  . Angioplasty    . Rotator cuff repair Bilateral     Allergies  Allergen Reactions  . Penicillins     Current Outpatient Prescriptions  Medication Sig Dispense Refill  . alfuzosin (UROXATRAL) 10 MG 24 hr tablet Take 1 tablet by mouth daily.      Marland Kitchen aspirin 81 MG tablet Take 81 mg by mouth daily.      . cyclobenzaprine (FLEXERIL) 10 MG tablet Take 10 mg by mouth as needed.       Marland Kitchen escitalopram (LEXAPRO) 10 MG tablet       . fish oil-omega-3 fatty acids 1000 MG capsule Take 2 g by mouth daily.      . Fluticasone-Salmeterol (ADVAIR DISKUS) 100-50 MCG/DOSE  AEPB Inhale 1 puff into the lungs every 12 (twelve) hours.      . isosorbide mononitrate (IMDUR) 60 MG 24 hr tablet Take 0.5 tablets (30 mg total) by mouth daily.  15 tablet  0  . Multiple Vitamins-Minerals (MULTIVITAMIN PO) Take 1 tablet by mouth daily.      . niacin (NIASPAN) 1000 MG CR tablet Take by mouth at bedtime.       Marland Kitchen RANEXA 500 MG 12 hr tablet TAKE ONE TABLET BY MOUTH TWICE DAILY  60 tablet  5  . Testosterone (ANDROGEL) 20.25 MG/1.25GM (1.62%) GEL Place onto the skin.      Marland Kitchen VYTORIN 10-40 MG per tablet       . nitroGLYCERIN (NITROLINGUAL) 0.4 MG/SPRAY spray Place 1 spray under the tongue every 5 (five) minutes x 3 doses as needed for chest pain.  12 g  12   No current facility-administered medications for this visit.    History   Social History  . Marital Status: Married    Spouse Name: N/A    Number of Children: 2  . Years of Education: N/A   Occupational History  . disabled    Social History Main Topics  . Smoking status:  Former Smoker    Quit date: 06/28/1983  . Smokeless tobacco: Never Used  . Alcohol Use: 0.5 oz/week    1 drink(s) per week  . Drug Use: Not on file  . Sexual Activity: Not on file   Other Topics Concern  . Not on file   Social History Narrative  . No narrative on file    Family History  Problem Relation Age of Onset  . Coronary artery disease Father   . Dementia Father   . Dementia Mother     ROS General: Negative; No fevers, chills, or night sweats HEENT: Negative; No changes in vision or hearing, sinus congestion, difficulty swallowing Pulmonary: Positive for asthma No cough, wheezing, shortness of breath, hemoptysis Cardiovascular: As in history of present illness; No chest pain, presyncope, syncope, palpatations GI: Negative; No nausea, vomiting, diarrhea, or abdominal pain GU: Negative; No dysuria, hematuria, or difficulty voiding Musculoskeletal: Negative; no myalgias, joint pain, or weakness Hematologic: Negative; no easy  bruising, bleeding Endocrine: Negative; no heat/cold intolerance; no diabetes, Neuro: Negative; no changes in balance, headaches Skin: Negative; No rashes or skin lesions Psychiatric: Positive for history of anxiety No behavioral problems, depression Sleep: Negative; No snoring,  daytime sleepiness, hypersomnolence, bruxism, restless legs, hypnogognic hallucinations. Other comprehensive 14 point system review is negative     Physical Exam BP 104/76  Pulse 74  Ht 5\' 10"  (1.778 m)  Wt 178 lb 9.6 oz (81.012 kg)  BMI 25.63 kg/m2 General: Alert, oriented, no distress.  Skin: normal turgor, no rashes, warm and dry HEENT: Normocephalic, atraumatic. Pupils equal round and reactive to light; sclera anicteric; extraocular muscles intact, No lid lag; Nose without nasal septal hypertrophy; Mouth/Parynx benign; Mallinpatti scale 3 Neck: No JVD, no carotid bruits; normal carotid upstroke Lungs: clear to ausculatation and percussion bilaterally; no wheezing or rales, normal inspiratory and expiratory effort Chest wall: without tenderness to palpitation Heart: PMI not displaced, RRR, s1 s2 normal, faint 1/6 systolic murmur, No diastolic murmur, no rubs, gallops, thrills, or heaves Abdomen: soft, nontender; no hepatosplenomehaly, BS+; abdominal aorta nontender and not dilated by palpation. Back: no CVA tenderness Pulses: 2+  Musculoskeletal: full range of motion, normal strength, no joint deformities Extremities: Pulses 2+, no clubbing cyanosis or edema, Homan's sign negative  Neurologic: grossly nonfocal; Cranial nerves grossly wnl Psychologic: Normal mood and affect    ECG (independently read by me): Sinus rhythm at 74 beats per minute.  No ectopy.  QTc interval normal.  LABS:  BMET    Component Value Date/Time   NA 140 06/27/2013 1118   NA 138 09/13/2011 2047   K 4.3 06/27/2013 1118   CL 101 06/27/2013 1118   CO2 26 06/27/2013 1118   GLUCOSE 103* 06/27/2013 1118   GLUCOSE 159* 09/13/2011 2047     BUN 12 06/27/2013 1118   BUN 9 09/13/2011 2047   CREATININE 1.10 06/27/2013 1118   CALCIUM 9.7 06/27/2013 1118   GFRNONAA 71 06/27/2013 1118   GFRAA 82 06/27/2013 1118     Hepatic Function Panel     Component Value Date/Time   PROT 6.7 06/27/2013 1118   PROT 7.6 09/26/2010 1211   ALBUMIN 4.0 09/26/2010 1211   AST 20 06/27/2013 1118   ALT 29 06/27/2013 1118   ALKPHOS 61 06/27/2013 1118   BILITOT 0.9 06/27/2013 1118     CBC    Component Value Date/Time   WBC 8.0 09/13/2011 2047   RBC 4.40 09/13/2011 2047   HGB 13.9 09/13/2011 2047   HCT  40.1 09/13/2011 2047   PLT 235 09/13/2011 2047   MCV 91.1 09/13/2011 2047   MCH 31.6 09/13/2011 2047   MCHC 34.7 09/13/2011 2047   RDW 13.2 09/13/2011 2047   LYMPHSABS 2.5 05/28/2011 1825   MONOABS 0.9 05/28/2011 1825   EOSABS 0.3 05/28/2011 1825   BASOSABS 0.1 05/28/2011 1825     BNP    Component Value Date/Time   PROBNP 52.0 02/15/2009 1550    Lipid Panel     Component Value Date/Time   CHOL  Value: 113        ATP III CLASSIFICATION:  <200     mg/dL   Desirable  200-239  mg/dL   Borderline High  >=240    mg/dL   High        12/15/2008 0850   TRIG 40 12/15/2008 0850   HDL 39* 12/15/2008 0850   CHOLHDL 2.9 12/15/2008 0850   VLDL 8 12/15/2008 0850   LDLCALC  Value: 66        Total Cholesterol/HDL:CHD Risk Coronary Heart Disease Risk Table                     Men   Women  1/2 Average Risk   3.4   3.3  Average Risk       5.0   4.4  2 X Average Risk   9.6   7.1  3 X Average Risk  23.4   11.0        Use the calculated Patient Ratio above and the CHD Risk Table to determine the patient's CHD Risk.        ATP III CLASSIFICATION (LDL):  <100     mg/dL   Optimal  100-129  mg/dL   Near or Above                    Optimal  130-159  mg/dL   Borderline  160-189  mg/dL   High  >190     mg/dL   Very High 12/15/2008 0850     RADIOLOGY: No results found.    ASSESSMENT AND PLAN: Mr. Victorino Lennon is a 63 year old gentleman, who is 10 years status post CABG surgery.  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  At his last cardiac catheterization in January 2010 get a patent SVG to diagonal vessel, which also filled the LAD and a patent vein graft supplying the circumflex marginal vessel.  His native right coronary artery had only 50% narrowing which undoubtedly contributed to the atretic LIMA graft.  He has not has any significant anginal symptomatology on isosorbide 30 mg in addition to Ranexa 500 mg twice a day.  With reference to his, hyperlipidemia he's been on Vytorin 10/40 in addition to niacin.  He tells me he recently had laboratory done by his primary physician.  I will attempt to get these results from our review.  I commended him on his weight loss.  2 years ago he weighed 209 pounds and his weight today is 178.  He is on disability.  He had been on Plavix, but apparently stopped taking this in 2013.  In the past, he also was on beta blocker therapy, but I do not see that he is on this presently.  As long as he remains stable without anginal symptoms.  I recommended a one-year followup evaluation, but am available to see him anytime sooner if problems arise.   Time spent: 25 minutes  Thomas A.  Claiborne Billings, MD, [facility]  04/03/2014 10:18 AM

## 2014-05-12 ENCOUNTER — Emergency Department (HOSPITAL_COMMUNITY)
Admission: EM | Admit: 2014-05-12 | Discharge: 2014-05-12 | Disposition: A | Discharge status: Home / Self Care | Payer: Medicare Other | Attending: Emergency Medicine | Admitting: Emergency Medicine

## 2014-05-12 ENCOUNTER — Encounter (HOSPITAL_COMMUNITY): Payer: Self-pay | Admitting: Emergency Medicine

## 2014-05-12 DIAGNOSIS — M129 Arthropathy, unspecified: Secondary | ICD-10-CM | POA: Insufficient documentation

## 2014-05-12 DIAGNOSIS — K089 Disorder of teeth and supporting structures, unspecified: Secondary | ICD-10-CM | POA: Insufficient documentation

## 2014-05-12 DIAGNOSIS — N400 Enlarged prostate without lower urinary tract symptoms: Secondary | ICD-10-CM | POA: Insufficient documentation

## 2014-05-12 DIAGNOSIS — F411 Generalized anxiety disorder: Secondary | ICD-10-CM | POA: Insufficient documentation

## 2014-05-12 DIAGNOSIS — Z862 Personal history of diseases of the blood and blood-forming organs and certain disorders involving the immune mechanism: Secondary | ICD-10-CM | POA: Insufficient documentation

## 2014-05-12 DIAGNOSIS — Z880 Allergy status to penicillin: Secondary | ICD-10-CM | POA: Insufficient documentation

## 2014-05-12 DIAGNOSIS — K0889 Other specified disorders of teeth and supporting structures: Secondary | ICD-10-CM

## 2014-05-12 DIAGNOSIS — Z8709 Personal history of other diseases of the respiratory system: Secondary | ICD-10-CM | POA: Insufficient documentation

## 2014-05-12 DIAGNOSIS — Z7982 Long term (current) use of aspirin: Secondary | ICD-10-CM | POA: Insufficient documentation

## 2014-05-12 DIAGNOSIS — Z87891 Personal history of nicotine dependence: Secondary | ICD-10-CM | POA: Insufficient documentation

## 2014-05-12 DIAGNOSIS — Z79899 Other long term (current) drug therapy: Secondary | ICD-10-CM | POA: Insufficient documentation

## 2014-05-12 DIAGNOSIS — Z8639 Personal history of other endocrine, nutritional and metabolic disease: Secondary | ICD-10-CM | POA: Insufficient documentation

## 2014-05-12 MED ORDER — CLINDAMYCIN HCL 150 MG PO CAPS: 450.0000 mg | ORAL_CAPSULE | Freq: Three times a day (TID) | ORAL | Status: DC

## 2014-05-12 MED ORDER — HYDROCODONE-ACETAMINOPHEN 5-325 MG PO TABS: 1.0000 | ORAL_TABLET | Freq: Four times a day (QID) | ORAL | Status: DC | PRN

## 2014-05-12 NOTE — ED Provider Notes (Signed)
CSN: [id number]     Arrival date & time 05/12/14  1646 History  This chart was scribed for non-physician practitioner working with Ezequiel Essex, MD by Mercy Moore, ED Scribe. This patient was seen in room TR09C/TR09C and the patient's care was started at 5:40 PM.    Chief Complaint  Patient presents with  . Dental Pain     The history is provided by the patient. No language interpreter was used.   HPI Comments: Jose Leonard is a 63 y.o. male who presents to the Emergency Department complaining of right lower dental pain, ongoing since yesterday. Patient locates pain near a broken tooth. Today patient reports right facial swelling that has since resolved. When the pain and swelling was at its worse patient reports that the pain and swelling involved the right side of his face and was exacerbated with applied pressure and swallowing. Patient now states that his pain has almost completely resolved. Patient denies oral injury.  Patient denies any difficulty swallowing at this time.  Patient denies fever or chills.    Past Medical History  Diagnosis Date  . Hyperlipidemia   . BPH (benign prostatic hyperplasia)   . Lung disease     chronic  . Memory loss   . Anxiety state, unspecified   . Arthropathy, unspecified, site unspecified   . Chronic ischemic heart disease, unspecified   . Family history of other neurological diseases    Past Surgical History  Procedure Laterality Date  . Angioplasty    . Rotator cuff repair Bilateral    Family History  Problem Relation Age of Onset  . Coronary artery disease Father   . Dementia Father   . Dementia Mother    History  Substance Use Topics  . Smoking status: Former Smoker    Quit date: 06/28/1983  . Smokeless tobacco: Never Used  . Alcohol Use: 0.5 oz/week    1 drink(s) per week    Review of Systems  Constitutional: Positive for chills. Negative for fever.  HENT: Positive for dental problem and facial swelling.    Gastrointestinal: Negative for nausea and vomiting.  Neurological: Negative for headaches.      Allergies  Penicillins  Home Medications   Prior to Admission medications   Medication Sig Start Date End Date Taking? Authorizing Provider  alfuzosin (UROXATRAL) 10 MG 24 hr tablet Take 1 tablet by mouth daily. 09/08/13   Historical Provider, MD  aspirin 81 MG tablet Take 81 mg by mouth daily.    Historical Provider, MD  cyclobenzaprine (FLEXERIL) 10 MG tablet Take 10 mg by mouth as needed.  04/11/13   Historical Provider, MD  escitalopram (LEXAPRO) 10 MG tablet  05/10/13   Historical Provider, MD  fish oil-omega-3 fatty acids 1000 MG capsule Take 2 g by mouth daily.    Historical Provider, MD  Fluticasone-Salmeterol (ADVAIR DISKUS) 100-50 MCG/DOSE AEPB Inhale 1 puff into the lungs every 12 (twelve) hours.    Historical Provider, MD  isosorbide mononitrate (IMDUR) 60 MG 24 hr tablet Take 0.5 tablets (30 mg total) by mouth daily. 02/12/14   [name], MD  Multiple Vitamins-Minerals (MULTIVITAMIN PO) Take 1 tablet by mouth daily.    Historical Provider, MD  niacin (NIASPAN) 1000 MG CR tablet Take by mouth at bedtime.  04/26/13   Historical Provider, MD  nitroGLYCERIN (NITROLINGUAL) 0.4 MG/SPRAY spray Place 1 spray under the tongue every 5 (five) minutes x 3 doses as needed for chest pain. 03/27/14   [name],  MD  RANEXA 500 MG 12 hr tablet TAKE ONE TABLET BY MOUTH TWICE DAILY 06/22/13   [name], MD  Testosterone (ANDROGEL) 20.25 MG/1.25GM (1.62%) GEL Place onto the skin.    Historical Provider, MD  VYTORIN 10-40 MG per tablet  05/10/13   Historical Provider, MD   Triage Vitals: BP 128/68  Pulse 75  Temp(Src) 98.5 F (36.9 C) (Oral)  Resp 18  Ht 5\' 8"  (1.727 m)  Wt 184 lb (83.462 kg)  BMI 27.98 kg/m2  SpO2 97% Physical Exam  Nursing note and vitals reviewed. Constitutional: He is oriented to person, place, and time. He appears well-developed and well-nourished. No distress.   HENT:  Head: Normocephalic and atraumatic.  Right Ear: Tympanic membrane and external ear normal.  Left Ear: Tympanic membrane and external ear normal.  Mouth/Throat: No trismus in the jaw. No dental abscesses.  No obvious facial swelling.  No obvious dental abscess of the gingiva. No sublingual tenderness or swelling. No submandibular or submental lymphadenopathy. No trismus.   Eyes: EOM are normal.  Neck: Neck supple.  Cardiovascular: Normal rate, regular rhythm and normal heart sounds.   Pulmonary/Chest: Effort normal and breath sounds normal. No respiratory distress.  Musculoskeletal: Normal range of motion.  Neurological: He is alert and oriented to person, place, and time.  Skin: Skin is warm and dry.  Psychiatric: He has a normal mood and affect. His behavior is normal.    ED Course  Procedures (including critical care time) COORDINATION OF CARE: 5:51 PM- Discussed treatment plan with patient at bedside and patient agreed to plan.   Labs Review Labs Reviewed - No data to display  Imaging Review No results found.   EKG Interpretation None      MDM   Final diagnoses:  None   Patient with toothache.  No gross abscess.  Exam unconcerning for Ludwig's angina or spread of infection.  Will treat with Clindamycin and pain medicine.  Urged patient to follow-up with dentist.  Return precautions given.   I personally performed the services described in this documentation, which was scribed in my presence. The recorded information has been reviewed and is accurate.    Hyman Bible, PA-C 05/12/14 1815

## 2014-05-12 NOTE — Discharge Instructions (Signed)
. Use the resource guide listed below to help you find a dentist if you do not already have one to followup with. It is very important that you get evaluated by a dentist as soon as possible. Call tomorrow to schedule an appointment. Use your pain medication as prescribed and do not operate heavy machinery while on pain medication. Note that your pain medication contains acetaminophen (Tylenol) & its is not recommended that you use additional acetaminophen (Tylenol) while taking this medication. Take your full course of antibiotics. Read the instructions below.  Eat a soft or liquid diet and rinse your mouth out after meals with warm water. You should see a dentist or return here at once if you have increased swelling, increased pain or uncontrolled bleeding from the site of your injury.   SEEK MEDICAL CARE IF:   You have increased pain not controlled with medicines.   You have swelling around your tooth, in your face or neck.   You have bleeding which starts, continues, or gets worse.   You have a fever >101  If you are unable to open your mouth    Emergency Department Resource Guide 1) Find a Doctor and Pay Out of Pocket Although you won't have to find out who is covered by your insurance plan, it is a good idea to ask around and get recommendations. You will then need to call the office and see if the doctor you have chosen will accept you as a new patient and what types of options they offer for patients who are self-pay. Some doctors offer discounts or will set up payment plans for their patients who do not have insurance, but you will need to ask so you aren't surprised when you get to your appointment.  2) Contact Your Local Health Department Not all health departments have doctors that can see patients for sick visits, but many do, so it is worth a call to see if yours does. If you don't know where your local health department is, you can check in your phone book. The CDC also has a  tool to help you locate your state's health department, and many state websites also have listings of all of their local health departments.  3) Find a [facility] If your illness is not likely to be very severe or complicated, you may want to try a walk in clinic. These are popping up all over the country in pharmacies, drugstores, and shopping centers. They're usually staffed by nurse practitioners or physician assistants that have been trained to treat common illnesses and complaints. They're usually fairly quick and inexpensive. However, if you have serious medical issues or chronic medical problems, these are probably not your best option.  No Primary Care Doctor: - Call Health Connect at  [phone] - they can help you locate a primary care doctor that  accepts your insurance, provides certain services, etc. - Physician Referral Service- [phone]  Chronic Pain Problems: Organization         Address  Phone   Notes  [facility]  [phone] Patients need to be referred by their primary care doctor.   Medication Assistance: Organization         Address  Phone   Notes  [facility] Medication [facility] [address] [place], [place] [address] [phone] --Must be a resident of [facility] -- Must have NO insurance coverage whatsoever (no Medicaid/ Medicare, etc.) -- The pt. MUST have a  primary care doctor that directs their care regularly and follows them in the community   MedAssist  [phone]   [employer]  [phone]    Agencies that provide inexpensive medical care: Organization         Address  Phone   Notes  [place]  [phone]   [facility]    [phone]   [facility], [place] [address] [phone]   [place] [address] [place] [phone]   Planned Parenthood    [phone]    [facility]    [phone]   [place] and [place] [address], [place] Phone:  [phone], Fax:  [phone] Hours of Operation:  9 am - 6 pm, M-F.  Also accepts Medicaid/Medicare and self-pay.  [facility] for [place], [address], [place] Phone: [phone], Fax: [phone]. Hours of Operation:  8:30 am - 5:30 pm, M-F.  Also accepts Medicaid and self-pay.  [facility] [place] [address], [place] Phone: [phone]   [place], [place], [place] [phone], Ext. [phone] Mondays & Thursdays: 7-9 AM.  First 15 patients are seen on a first come, first serve basis.    [place] Providers:  Organization         Address  Phone   Notes  [facility] [address], [place] [phone] Also accepts self-pay patients.  [facility] [address], [place]  [phone]   [place], [address], [place] [phone]   [facility] [address], [place] [phone]   Lucianne Lei [address], [place]   [phone] Only accepts [place] Access [place] patients after they have their name applied to their card.   Self-Pay (no insurance) in [facility]:  Organization         Address  Phone   Notes  Sickle Cell Patients, [facility] [place] [phone]   [facility] [place] [phone]   [facility] [place]  [place], [place], [place] [phone]   [facility]/Dr. Osei-Bonsu  [address], [place] or [address], [place] [phone] Phone number for both [place] and [place] locations is the same.  Urgent Medical and [facility] [address] [place] [phone]   [facility] [address], [place] or [address] [phone]   [facility] [address] [place] [phone], phone; [phone], fax Sees patients 1st and 3rd Saturday of every month.  Must not qualify for public or private insurance (i.e. Medicaid, Medicare, [place] Health Choice, Veterans' Benefits)  Household income should be no more than 200% of the poverty level The clinic cannot treat you if you are pregnant or think you are pregnant  Sexually transmitted diseases are not treated at the clinic.    Dental Care: Organization         Address  Phone  Notes  [facility] [address] [place], [place] [phone])  [phone] Accepts children up to age 33 who are enrolled in Medicaid or [place] Health Choice; pregnant women with a Medicaid card; and children who have applied for Medicaid or [place] Health Choice, but were declined, whose parents can pay a reduced fee at time of service.  [facility] of [facility]  [address], [place] [phone] Accepts children up to age 65 who are enrolled in [place] or [place]; pregnant women with a Medicaid card; and children who have applied for Medicaid or [place] Health Choice, but were declined, whose parents can pay a reduced fee at time of service.  [facility] Access PRO[facility]  [place] [phone] Patients are seen by appointment only. Walk-ins are not accepted. [place] will see patients 25 years of age and older. Monday - Tuesday (8am-5pm) Most Wednesdays (8:30-5pm) $30 per visit, cash only  [facility] Adult [facility] Access PRO[facility]  [address], [facility] [phone] Patients are seen by appointment only. Walk-ins are not accepted. [place] will see patients 45 years of age and older. One Wednesday  Evening (Monthly: Volunteer Based).  $30 per visit, cash only  [place]  [phone] for adults; Children under age 54, call Graduate Pediatric Dentistry at [phone]. Children aged 39-14, please call [phone] to request a pediatric application.  Dental services are provided in all areas of dental care including fillings, crowns and bridges, complete and partial dentures, implants, gum treatment, root canals, and extractions. Preventive care is also provided. Treatment is provided to both adults and children. Patients are selected via a lottery and there is often a waiting list.   [facility] [address] [place]  [phone] [email or web address]   [facility] [address] [place], [place] [phone], Ext. [phone] Second and Fourth Thursday of each month, opens at 6:30 AM; Clinic ends at 9 AM.  Patients are seen on a first-come first-served basis, and a limited number are seen during each clinic.   [facility]  [address] [place], [place] [phone]   Eligibility Requirements You must have lived in [place], [place], or [place] counties for at least the last three months.   You cannot be eligible for state or federal sponsored [employer], including [employer], [place], or [employer].   You generally cannot be eligible for healthcare insurance through your employer.    How to apply: Eligibility screenings are held every Tuesday and Wednesday afternoon from 1:00 pm until 4:00 pm. You do not need an appointment for the interview!  [facility] [address], [place], [place]   [place]  [place] Department  [place]  [phone]    Behavioral Health Resources in the Community: Intensive Outpatient Programs Organization         Address  Phone  Notes  [place]. [address], [place], [place] [phone]   [facility] [address], [place], [place]   ADS: Alcohol & Drug Sv[facility] [address] [place], [place]   [place] [address],  [place], [place] or [phone]   Substance Abuse Resources Organization         Address  Phone  Notes  Alcohol and Drug Services  [phone]  [place]  [phone]   [facility] [place]   Chinita Pester  [phone]   Residential & Outpatient Substance Abuse Program  [phone]   Psychological Services Organization         Address  Phone  Notes  [facility]  [place]  [phone]   [place] [address] [place] or [phone]    Mobile Crisis Teams Organization         Address  Phone  Notes  [facility], Mobile Crisis Care Unit  [phone]   [facility]  [address] [place], [place]   [place] [address], [place] [phone]    Self-Help/Support Groups Organization         Address  Phone             Notes  [place]. of [place] - variety of support groups  [place] Call for more information  Narcotics Anonymous (NA), [facility] [address], [employer] [place]  2 meetings at this location   Special educational needs teacher         Address  Phone  Notes  [facility] Residential Treatment [place],    [place]  [phone]   [facility]  [address] [place] [address], [place], [place]   [place], [place] [phone] Admissions: 8am-3pm M-F  Incentives Substance [place] [address],    [place], [place] [phone]   [facility] [address] [place],  [place], [place]   [facility] [address],  [place], [place]   [facility] - Intensive Outpatient [address] [place] [address], [place], [place]   [facility] ([place].,  [place], [place] [phone] or [phone]   [facility] (RTS) [address], [place], [place] Accepts Medicaid  [facility] [place] [address]  [place] [phone] Substance Abuse/Addiction Treatment   [facility] Organization         Address  Phone  Notes  [facility]  [phone]   Domenic Schwab, PhD [address] [place]lis [place], [place]   [phone] or [phone]   [place], [place] [phone]   [facility] [address], [place], [place] [phone] Insurance/Medicaid/sponsorship through [facility] and Families [address], [address]                                    [place], [place] [phone] [place] [address] [place], [place] [phone]    Dr. Adele Schilder  [phone]   [facility] Dept. 1) [address] [place] 2) [place] 3)  [place], [place] [phone]  [phone]   [place] [phone] or [phone] (After Hours)

## 2014-05-12 NOTE — ED Provider Notes (Signed)
Medical screening examination/treatment/procedure(s) were performed by non-physician practitioner and as supervising physician I was immediately available for consultation/collaboration.   EKG Interpretation None        Ezequiel Essex, MD 05/12/14 2011

## 2014-05-12 NOTE — ED Notes (Signed)
He states his R lower tooth was hurting yesterday then today his R cheek was swollen. He thinks the pain is near his tooth that broke off

## 2014-05-12 NOTE — ED Notes (Signed)
The pt has had tooth pain since yesterday.  Swollen rt jaw since this am with pain

## 2014-07-03 ENCOUNTER — Emergency Department (HOSPITAL_COMMUNITY): Payer: Medicare Other

## 2014-07-03 ENCOUNTER — Encounter (HOSPITAL_COMMUNITY): Payer: Self-pay | Admitting: Emergency Medicine

## 2014-07-03 DIAGNOSIS — Y9389 Activity, other specified: Secondary | ICD-10-CM | POA: Diagnosis not present

## 2014-07-03 DIAGNOSIS — Z880 Allergy status to penicillin: Secondary | ICD-10-CM | POA: Insufficient documentation

## 2014-07-03 DIAGNOSIS — X503XXA Overexertion from repetitive movements, initial encounter: Secondary | ICD-10-CM | POA: Insufficient documentation

## 2014-07-03 DIAGNOSIS — N138 Other obstructive and reflux uropathy: Secondary | ICD-10-CM | POA: Insufficient documentation

## 2014-07-03 DIAGNOSIS — S335XXA Sprain of ligaments of lumbar spine, initial encounter: Secondary | ICD-10-CM | POA: Diagnosis not present

## 2014-07-03 DIAGNOSIS — E785 Hyperlipidemia, unspecified: Secondary | ICD-10-CM | POA: Diagnosis not present

## 2014-07-03 DIAGNOSIS — Y9289 Other specified places as the place of occurrence of the external cause: Secondary | ICD-10-CM | POA: Insufficient documentation

## 2014-07-03 DIAGNOSIS — Z79899 Other long term (current) drug therapy: Secondary | ICD-10-CM | POA: Diagnosis not present

## 2014-07-03 DIAGNOSIS — S8990XA Unspecified injury of unspecified lower leg, initial encounter: Secondary | ICD-10-CM | POA: Insufficient documentation

## 2014-07-03 DIAGNOSIS — S99929A Unspecified injury of unspecified foot, initial encounter: Principal | ICD-10-CM

## 2014-07-03 DIAGNOSIS — S99919A Unspecified injury of unspecified ankle, initial encounter: Principal | ICD-10-CM

## 2014-07-03 DIAGNOSIS — M129 Arthropathy, unspecified: Secondary | ICD-10-CM | POA: Insufficient documentation

## 2014-07-03 DIAGNOSIS — Z8709 Personal history of other diseases of the respiratory system: Secondary | ICD-10-CM | POA: Insufficient documentation

## 2014-07-03 DIAGNOSIS — Z7982 Long term (current) use of aspirin: Secondary | ICD-10-CM | POA: Diagnosis not present

## 2014-07-03 DIAGNOSIS — Z8673 Personal history of transient ischemic attack (TIA), and cerebral infarction without residual deficits: Secondary | ICD-10-CM | POA: Diagnosis not present

## 2014-07-03 DIAGNOSIS — F411 Generalized anxiety disorder: Secondary | ICD-10-CM | POA: Diagnosis not present

## 2014-07-03 DIAGNOSIS — N401 Enlarged prostate with lower urinary tract symptoms: Secondary | ICD-10-CM | POA: Diagnosis not present

## 2014-07-03 DIAGNOSIS — Z87891 Personal history of nicotine dependence: Secondary | ICD-10-CM | POA: Insufficient documentation

## 2014-07-03 NOTE — ED Notes (Signed)
Pt presents with multiple complaints. Pt c/o right and left lower back pain for 4-5 days. Pt denies constipation, dysuria, hematuria, and increase frequency. Pt states he just got done having diarrhea for 4 days. Pt also c/o left knee pain from a fall today. Pt states he was trying to stand up and his left knee gave out landing on left knee.

## 2014-07-04 ENCOUNTER — Emergency Department (HOSPITAL_COMMUNITY)
Admission: EM | Admit: 2014-07-04 | Discharge: 2014-07-04 | Disposition: A | Discharge status: Home / Self Care | Payer: Medicare Other | Attending: Emergency Medicine | Admitting: Emergency Medicine

## 2014-07-04 ENCOUNTER — Emergency Department (HOSPITAL_COMMUNITY): Payer: Medicare Other

## 2014-07-04 DIAGNOSIS — S8992XA Unspecified injury of left lower leg, initial encounter: Secondary | ICD-10-CM

## 2014-07-04 DIAGNOSIS — S39012A Strain of muscle, fascia and tendon of lower back, initial encounter: Secondary | ICD-10-CM

## 2014-07-04 LAB — URINALYSIS, ROUTINE W REFLEX MICROSCOPIC
Bilirubin Urine: NEGATIVE
GLUCOSE, UA: NEGATIVE mg/dL
Hgb urine dipstick: NEGATIVE
Ketones, ur: NEGATIVE mg/dL
Leukocytes, UA: NEGATIVE
Nitrite: NEGATIVE
Protein, ur: NEGATIVE mg/dL
SPECIFIC GRAVITY, URINE: 1.02 (ref 1.005–1.030)
Urobilinogen, UA: 0.2 mg/dL (ref 0.0–1.0)
pH: 6.5 (ref 5.0–8.0)

## 2014-07-04 MED ORDER — PREDNISONE 50 MG PO TABS: 50.0000 mg | ORAL_TABLET | Freq: Every day | ORAL | Status: DC

## 2014-07-04 MED ORDER — OXYCODONE-ACETAMINOPHEN 5-325 MG PO TABS
1.0000 | ORAL_TABLET | Freq: Once | ORAL | Status: AC
  Administered 2014-07-04: 1 via ORAL
  Filled 2014-07-04: qty 1

## 2014-07-04 MED ORDER — OXYCODONE-ACETAMINOPHEN 5-325 MG PO TABS: 1.0000 | ORAL_TABLET | Freq: Four times a day (QID) | ORAL | Status: DC | PRN

## 2014-07-04 NOTE — ED Notes (Signed)
Knee immobilizer placed on pt. Pt verbalized understanding of knee immobilizer application. Pt states pain level is 3 out of 10.

## 2014-07-04 NOTE — ED Provider Notes (Signed)
CSN: [id number]     Arrival date & time 07/03/14  2234 History   First MD Initiated Contact with Patient 07/04/14 [phone]     Chief Complaint  Patient presents with  . Knee Pain  . Back Pain     (Consider location/radiation/quality/duration/timing/severity/associated sxs/prior Treatment) HPI Patient presents to the emergency department following a fall that occurred earlier yesterday evening.  The patient, states, that his back has been bothering for the last 4-5 days.  He states that he twisted his ankle and landed on his left knee.  Patient, states, that his left knee feels swollen and painful.  Meds creating issues with his walking.  Patient, states, that he has intermittent back pain, similar to his current pain.  Patient denies dysuria, incontinence, weakness, dizziness, numbness, headache, blurred vision, chest pain, shortness of breath, abdominal pain, fever, rash, or syncope.  The patient, states, that he did not take any medications prior to arrival.  Patient, states, that movement and palpation make the pain, worse Past Medical History  Diagnosis Date  . Hyperlipidemia   . BPH (benign prostatic hyperplasia)   . Lung disease     chronic  . Memory loss   . Anxiety state, unspecified   . Arthropathy, unspecified, site unspecified   . Chronic ischemic heart disease, unspecified   . Family history of other neurological diseases    Past Surgical History  Procedure Laterality Date  . Angioplasty    . Rotator cuff repair Bilateral    Family History  Problem Relation Age of Onset  . Coronary artery disease Father   . Dementia Father   . Dementia Mother    History  Substance Use Topics  . Smoking status: Former Smoker    Quit date: 06/28/1983  . Smokeless tobacco: Never Used  . Alcohol Use: 0.5 oz/week    1 drink(s) per week    Review of Systems  All other systems negative except as documented in the HPI. All pertinent positives and negatives as reviewed in the  HPI.  Allergies  Penicillins  Home Medications   Prior to Admission medications   Medication Sig Start Date End Date Taking? Authorizing Provider  acetaminophen (TYLENOL) 500 MG tablet Take 1,000 mg by mouth every 8 (eight) hours as needed for mild pain or headache.   Yes Historical Provider, MD  albuterol (PROVENTIL HFA;VENTOLIN HFA) 108 (90 BASE) MCG/ACT inhaler Inhale 1-2 puffs into the lungs every 6 (six) hours as needed for wheezing or shortness of breath.   Yes Historical Provider, MD  alfuzosin (UROXATRAL) 10 MG 24 hr tablet Take 10 mg by mouth daily.  09/08/13  Yes Historical Provider, MD  aspirin EC 81 MG tablet Take 81 mg by mouth daily.   Yes Historical Provider, MD  cyclobenzaprine (FLEXERIL) 10 MG tablet Take 10 mg by mouth 3 (three) times daily as needed for muscle spasms.  04/11/13  Yes Historical Provider, MD  escitalopram (LEXAPRO) 10 MG tablet Take 10 mg by mouth daily.  05/10/13  Yes Historical Provider, MD  Fluticasone-Salmeterol (ADVAIR DISKUS) 100-50 MCG/DOSE AEPB Inhale 1 puff into the lungs 2 (two) times daily as needed (for shortness of breath).    Yes Historical Provider, MD  isosorbide mononitrate (IMDUR) 60 MG 24 hr tablet Take 30 mg by mouth daily.   Yes Historical Provider, MD  Multiple Vitamins-Minerals (MULTIVITAMIN PO) Take 1 tablet by mouth daily.   Yes Historical Provider, MD  niacin (NIASPAN) 1000 MG CR tablet Take 1,000 mg by mouth at  bedtime.  04/26/13  Yes Historical Provider, MD  nitroGLYCERIN (NITROLINGUAL) 0.4 MG/SPRAY spray Place 1 spray under the tongue every 5 (five) minutes x 3 doses as needed for chest pain.   Yes Historical Provider, MD  Omega-3 Fatty Acids (FISH OIL) 1200 MG CAPS Take 1,200 mg by mouth daily.   Yes Historical Provider, MD  ranolazine (RANEXA) 500 MG 12 hr tablet Take 500 mg by mouth 2 (two) times daily.   Yes Historical Provider, MD  Testosterone (ANDROGEL) 20.25 MG/1.25GM (1.62%) GEL Place 1 application onto the skin daily.    Yes  Historical Provider, MD  VYTORIN 10-40 MG per tablet Take 1 tablet by mouth daily.  05/10/13  Yes Historical Provider, MD   BP 108/70  Pulse 95  Temp(Src) 98.4 F (36.9 C)  Resp 18  SpO2 96% Physical Exam  Nursing note and vitals reviewed. Constitutional: He is oriented to person, place, and time. He appears well-developed and well-nourished. No distress.  HENT:  Head: Normocephalic and atraumatic.  Mouth/Throat: Oropharynx is clear and moist.  Eyes: Pupils are equal, round, and reactive to light.  Neck: Normal range of motion. Neck supple.  Cardiovascular: Normal rate and regular rhythm.  Exam reveals no gallop and no friction rub.   No murmur heard. Pulmonary/Chest: Effort normal and breath sounds normal. No respiratory distress.  Musculoskeletal:       Left knee: He exhibits decreased range of motion and swelling. He exhibits no ecchymosis, no deformity, no laceration and no erythema.       Lumbar back: He exhibits tenderness. He exhibits normal range of motion, no bony tenderness, no edema, no deformity and no laceration.       Legs: Neurological: He is alert and oriented to person, place, and time. He exhibits normal muscle tone. Coordination normal.  Skin: Skin is warm and dry. No rash noted. No erythema.    ED Course  Procedures (including critical care time) Labs Review Labs Reviewed  URINALYSIS, ROUTINE W REFLEX MICROSCOPIC - Abnormal; Notable for the following:    Color, Urine AMBER (*)    All other components within normal limits    Imaging Review Dg Lumbar Spine Complete  07/04/2014   CLINICAL DATA:  Low back pain, left leg pain.  No injury.  EXAM: LUMBAR SPINE - COMPLETE 4+ VIEW  COMPARISON:  MRI of the lumbar spine August 12, 2011  FINDINGS: Lumbar vertebral bodies appear intact and aligned with maintenance of lumbar lordosis. Moderate L5-S1 disc height loss, unchanged. At least moderate lower lumbar facet arthropathy. Mild central endplate spurring R6-7, L3-4.  No destructive bony lesions. No pars interarticularis defects.  Sacroiliac joints are symmetric. Included prevertebral and paraspinal soft tissue planes are nonsuspicious. Mild aortoiliac vascular calcifications.  IMPRESSION: Similar lumbar spondylosis without acute fracture deformity or malalignment.   Electronically Signed   By: Elon Alas   On: 07/04/2014 06:42   Dg Knee Complete 4 Views Left  07/04/2014   CLINICAL DATA:  Left anterior knee pain.  EXAM: LEFT KNEE - COMPLETE 4+ VIEW  COMPARISON:  None.  FINDINGS: There is no evidence of fracture or dislocation. The joint spaces are preserved. No significant degenerative change is seen; the patellofemoral joint is grossly unremarkable in appearance.  A small knee joint effusion is noted. The visualized soft tissues are otherwise unremarkable in appearance.  IMPRESSION: 1. No evidence of fracture or dislocation. 2. Small knee joint effusion noted.   Electronically Signed   By: Francoise Schaumann.D.  On: 07/04/2014 00:32     Patient be referred back to his primary care Dr. he is able to ambulate without difficulty other than pain in his left knee.  The patient be placed in a knee immobilizer, told to return here as needed.  Ice and elevate the knee  Brent General, PA-C 07/04/14 [phone]

## 2014-07-04 NOTE — Discharge Instructions (Signed)
Return here as needed. Follow up with your primary care doctor for a recheck. Ice and elevate your knee.

## 2014-07-04 NOTE — ED Notes (Signed)
encouraged pt to urinate for UA. Pt tried- drinking water. Pt firm about not wanting in and out cath

## 2014-07-04 NOTE — ED Notes (Signed)
Pt ambulated with standby assistance. Pt limping and states it is painful to walk. Pt has crutches and walker at home.

## 2014-07-04 NOTE — ED Notes (Signed)
System downtime. See paper charting.

## 2014-07-06 NOTE — ED Provider Notes (Signed)
Medical screening examination/treatment/procedure(s) were performed by non-physician practitioner and as supervising physician I was immediately available for consultation/collaboration.   EKG Interpretation None        Ephraim Hamburger, MD 07/06/14 1514

## 2014-07-09 ENCOUNTER — Encounter: Payer: Self-pay | Admitting: Gastroenterology

## 2014-08-01 ENCOUNTER — Ambulatory Visit: Payer: Medicare Other | Admitting: Physician Assistant

## 2014-08-28 ENCOUNTER — Ambulatory Visit: Payer: Medicare Other | Admitting: Neurology

## 2014-08-28 ENCOUNTER — Telehealth: Payer: Self-pay | Admitting: *Deleted

## 2014-08-28 ENCOUNTER — Telehealth: Payer: Self-pay | Admitting: Neurology

## 2014-08-28 NOTE — Telephone Encounter (Signed)
FYI

## 2014-08-28 NOTE — Telephone Encounter (Signed)
Patient no showed for an appointment today, 08/28/2014, at 1200.

## 2014-08-28 NOTE — Telephone Encounter (Signed)
Patient calling stating that he missed his appointment, states that he has the stomach bug and will call back later to r/s appointment.

## 2014-09-12 ENCOUNTER — Other Ambulatory Visit: Payer: Self-pay | Admitting: Cardiovascular Disease

## 2014-09-12 NOTE — Telephone Encounter (Signed)
E-SENT PHARMACY  

## 2014-12-05 DIAGNOSIS — E789 Disorder of lipoprotein metabolism, unspecified: Secondary | ICD-10-CM | POA: Diagnosis not present

## 2014-12-05 DIAGNOSIS — R05 Cough: Secondary | ICD-10-CM | POA: Diagnosis not present

## 2014-12-05 DIAGNOSIS — J9811 Atelectasis: Secondary | ICD-10-CM | POA: Diagnosis not present

## 2014-12-06 ENCOUNTER — Telehealth: Payer: Self-pay | Admitting: Internal Medicine

## 2014-12-06 ENCOUNTER — Ambulatory Visit: Payer: Medicare Other | Admitting: Internal Medicine

## 2014-12-06 NOTE — Telephone Encounter (Signed)
No charge. 

## 2014-12-06 NOTE — Telephone Encounter (Signed)
Dr Pyrtle-please advise.... 

## 2014-12-17 ENCOUNTER — Telehealth: Payer: Self-pay | Admitting: Cardiovascular Disease

## 2014-12-17 NOTE — Telephone Encounter (Signed)
Message sent to Dr.Kelly for advice. 

## 2014-12-17 NOTE — Telephone Encounter (Signed)
Juliann Pulse called in stating that pt's Ranexa script is counteracting with his Clarithromycin. Please call  Thanks

## 2014-12-19 NOTE — Telephone Encounter (Signed)
Spoke with pt wife this am.  Abx was filled on 12-05-14 and pt was advised hold vytorin.  Was not told to hold Ranexa.

## 2014-12-19 NOTE — Telephone Encounter (Signed)
Have pt hold Ranexa while on clarithromycin, route to Erasmo Downer to notify patient and pharmacy

## 2014-12-20 NOTE — Telephone Encounter (Signed)
Spoke with pharmacist - explained that we should have been notified when clarithromycin was filled 3 weeks ago.  Ok to refill ranexa now.

## 2015-02-18 DIAGNOSIS — E789 Disorder of lipoprotein metabolism, unspecified: Secondary | ICD-10-CM | POA: Diagnosis not present

## 2015-02-18 DIAGNOSIS — E291 Testicular hypofunction: Secondary | ICD-10-CM | POA: Diagnosis not present

## 2015-02-18 DIAGNOSIS — Z125 Encounter for screening for malignant neoplasm of prostate: Secondary | ICD-10-CM | POA: Diagnosis not present

## 2015-02-25 DIAGNOSIS — E291 Testicular hypofunction: Secondary | ICD-10-CM | POA: Diagnosis not present

## 2015-02-25 DIAGNOSIS — Z23 Encounter for immunization: Secondary | ICD-10-CM | POA: Diagnosis not present

## 2015-02-25 DIAGNOSIS — E789 Disorder of lipoprotein metabolism, unspecified: Secondary | ICD-10-CM | POA: Diagnosis not present

## 2015-02-28 ENCOUNTER — Telehealth: Payer: Self-pay

## 2015-03-08 ENCOUNTER — Encounter: Payer: Self-pay | Admitting: Internal Medicine

## 2015-04-11 ENCOUNTER — Encounter: Payer: Self-pay | Admitting: *Deleted

## 2015-05-07 ENCOUNTER — Encounter: Payer: Self-pay | Admitting: Internal Medicine

## 2015-05-07 ENCOUNTER — Ambulatory Visit (INDEPENDENT_AMBULATORY_CARE_PROVIDER_SITE_OTHER): Payer: Medicare Other | Admitting: Internal Medicine

## 2015-05-07 VITALS — BP 122/70 | Ht 67.5 in | Wt 192.2 lb

## 2015-05-07 DIAGNOSIS — K529 Noninfective gastroenteritis and colitis, unspecified: Secondary | ICD-10-CM | POA: Diagnosis not present

## 2015-05-07 DIAGNOSIS — Z800 Family history of malignant neoplasm of digestive organs: Secondary | ICD-10-CM | POA: Diagnosis not present

## 2015-05-07 DIAGNOSIS — R6881 Early satiety: Secondary | ICD-10-CM

## 2015-05-07 DIAGNOSIS — R1310 Dysphagia, unspecified: Secondary | ICD-10-CM

## 2015-05-07 DIAGNOSIS — R630 Anorexia: Secondary | ICD-10-CM

## 2015-05-07 MED ORDER — NA SULFATE-K SULFATE-MG SULF 17.5-3.13-1.6 GM/177ML PO SOLN: ORAL | Status: DC

## 2015-05-07 NOTE — Patient Instructions (Signed)
You have been scheduled for an endoscopy and colonoscopy. Please follow the written instructions given to you at your visit today. Please pick up your prep supplies at the pharmacy within the next 1-3 days. If you use inhalers (even only as needed), please bring them with you on the day of your procedure.  

## 2015-05-07 NOTE — Progress Notes (Signed)
Patient ID: Jose Leonard, male   DOB: 1951-01-24, 64 y.o.   MRN: [id number] HPI: Jose Leonard is a 64 year old male with a past medical history of CAD status post CABG in 2005, hyperlipidemia, memory loss, family history of colon cancer in mother at age 26, diverticulosis who seen in consultation at the request of Dr. Wilson Singer to evaluate chronic loose stools and also consider repeat screening colonoscopy. He's here today with his wife. He reports that he has been having frequent loose stools for the past 6-12 months. This is often associated with eating. Stools occur throughout the day and even at night. He also wakes at night to urinate. He feels that for some reason of the last 2 weeks things been better but his wife disagrees. This is associated with lower abdominal cramping pain often alleviated by bowel movement. Can be urgent and he has had accidents. He denies blood in his stool or melena. Appetite has been significantly decreased over the last 3-5 years and he reports early satiety. He estimates a 15-20 pound weight loss in 5 years but again his wife has not seen this significant of weight loss. He denies heartburn but does report intermittent solid food dysphagia and having to push food down with fluids. No odynophagia. Denies hepatobiliary complaint. Denies nausea and vomiting. No new medicines. He previously took Plavix and metoprolol but these fell off for unknown reason and when his primary provider/cardiologist realized these medicines were not being taken they were not restarted. Family history notable for mother with colorectal cancer at age 82. He had a screening colonoscopy performed in 2008 by Dr. Sharlett Iles which revealed left-sided diverticulosis but was otherwise normal.  Recent labs in March 2016 revealed a normal CBC, normal complete metabolic panel. Thyroid function testing also normal (labs reviewed and to be scanned)  Past Medical History  Diagnosis Date  . Hyperlipidemia   . BPH  (benign prostatic hyperplasia)   . Lung disease     chronic  . Memory loss   . Anxiety state, unspecified   . Arthropathy, unspecified, site unspecified   . Chronic ischemic heart disease, unspecified   . Family history of other neurological diseases   . Gallstones   . Hiatal hernia   . Diverticulosis   . CAD (coronary artery disease)   . Asthma     Past Surgical History  Procedure Laterality Date  . Angioplasty    . Rotator cuff repair Bilateral   . Cardiac catheterization  08/22/2004  . Rotator cuff repair  [id number]  . Rotator cuff repair  01/2003  . Cardiac catheterization  12/17/2008  . Coronary artery bypass graft  2008    x4  . Coronary artery bypass graft  2005    x3    Outpatient Prescriptions Prior to Visit  Medication Sig Dispense Refill  . acetaminophen (TYLENOL) 500 MG tablet Take 1,000 mg by mouth every 8 (eight) hours as needed for mild pain or headache.    . albuterol (PROVENTIL HFA;VENTOLIN HFA) 108 (90 BASE) MCG/ACT inhaler Inhale 1-2 puffs into the lungs every 6 (six) hours as needed for wheezing or shortness of breath.    . alfuzosin (UROXATRAL) 10 MG 24 hr tablet Take 10 mg by mouth daily.     Marland Kitchen aspirin EC 81 MG tablet Take 81 mg by mouth daily.    . cyclobenzaprine (FLEXERIL) 10 MG tablet Take 10 mg by mouth 3 (three) times daily as needed for muscle spasms.     Marland Kitchen escitalopram (LEXAPRO)  10 MG tablet Take 10 mg by mouth daily.     . Fluticasone-Salmeterol (ADVAIR DISKUS) 100-50 MCG/DOSE AEPB Inhale 1 puff into the lungs 2 (two) times daily as needed (for shortness of breath).     . isosorbide mononitrate (IMDUR) 60 MG 24 hr tablet Take 30 mg by mouth daily.    . Multiple Vitamins-Minerals (MULTIVITAMIN PO) Take 1 tablet by mouth daily.    . niacin (NIASPAN) 1000 MG CR tablet Take 1,000 mg by mouth at bedtime.     . nitroGLYCERIN (NITROLINGUAL) 0.4 MG/SPRAY spray Place 1 spray under the tongue every 5 (five) minutes x 3 doses as needed for chest pain.    .  Omega-3 Fatty Acids (FISH OIL) 1200 MG CAPS Take 1,200 mg by mouth daily.    Marland Kitchen RANEXA 500 MG 12 hr tablet TAKE ONE TABLET BY MOUTH TWICE DAILY 60 tablet 6  . ranolazine (RANEXA) 500 MG 12 hr tablet Take 500 mg by mouth 2 (two) times daily.    Marland Kitchen VYTORIN 10-40 MG per tablet Take 1 tablet by mouth daily.     Marland Kitchen oxyCODONE-acetaminophen (PERCOCET/ROXICET) 5-325 MG per tablet Take 1 tablet by mouth every 6 (six) hours as needed for severe pain. 15 tablet 0  . predniSONE (DELTASONE) 50 MG tablet Take 1 tablet (50 mg total) by mouth daily. 5 tablet 0  . Testosterone (ANDROGEL) 20.25 MG/1.25GM (1.62%) GEL Place 1 application onto the skin daily.      No facility-administered medications prior to visit.    Allergies  Allergen Reactions  . Penicillins     Childhood allergy    Family History  Problem Relation Age of Onset  . Coronary artery disease Father     bypass x4  . Dementia Father   . Diabetes Father   . Stroke Father   . Dementia Mother   . Asthma Mother   . Colon cancer Mother   . Arthritis Mother   . Heart attack Mother   . Cancer Daughter   . Arthritis Sister   . Parkinson's disease Brother   . Arthritis Brother   . Asthma Brother   . Parkinson's disease Maternal Uncle   . Stroke Maternal Grandmother   . Diabetes Maternal Grandfather   . Arthritis Maternal Grandfather   . Cancer Paternal Grandfather     History  Substance Use Topics  . Smoking status: Former Smoker -- 30 years    Quit date: 06/28/1983  . Smokeless tobacco: Never Used  . Alcohol Use: 0.6 oz/week    1 Standard drinks or equivalent per week     Comment: occ    ROS: As per history of present illness, otherwise negative  BP 122/70 mmHg  Ht 5' 7.5" (1.715 m)  Wt 192 lb 4 oz (87.204 kg)  BMI 29.65 kg/m2 Constitutional: Well-developed and well-nourished. No distress. HEENT: Normocephalic and atraumatic. Oropharynx is clear and moist. No oropharyngeal exudate. Conjunctivae are normal.  No scleral  icterus. Neck: Neck supple. Trachea midline. Cardiovascular: Normal rate, regular rhythm and intact distal pulses. No M/R/G Pulmonary/chest: Effort normal and breath sounds normal. No wheezing, rales or rhonchi. Abdominal: Soft, nontender, nondistended. Bowel sounds active throughout. There are no masses palpable. No hepatosplenomegaly. Extremities: no clubbing, cyanosis, or edema Lymphadenopathy: No cervical adenopathy noted. Neurological: Alert and oriented to person place and time. Skin: Skin is warm and dry. No rashes noted. Psychiatric: Normal mood and affect. Behavior is normal.  RELEVANT LABS AND IMAGING: See HPI  Abdominal ultrasound 2011 --  single tiny gallstone seen by ultrasound  ASSESSMENT/PLAN:  64 year old male with a past medical history of CAD status post CABG in 2005, hyperlipidemia, memory loss, family history of colon cancer in mother at age 65, diverticulosis who seen in consultation at the request of Dr. Wilson Singer to evaluate chronic loose stools and also consider repeat screening colonoscopy.   1. Chronic loose stools/family history of colorectal cancer -- 2 reasons to repeat colonoscopy. We discussed the tests including the risks and benefits and he is agreeable to proceed. Plan random biopsies for microscopic colitis. May benefit from treatment but will wait until colonoscopy to determine treatment pathway.  2. Early satiety/poor appetite/dysphagia -- upper endoscopy with possible dilation recommended. We discussed the test including the risks and benefits and he is agreeable to proceed. He is not having heartburn to warrant PPI initiation at this time. Further recommendations after endoscopy      Cc:Anda Kraft, Md [address] [place], [place] [address]

## 2015-06-13 ENCOUNTER — Ambulatory Visit (AMBULATORY_SURGERY_CENTER): Payer: Medicare Other | Admitting: Internal Medicine

## 2015-06-13 ENCOUNTER — Encounter: Payer: Self-pay | Admitting: Internal Medicine

## 2015-06-13 VITALS — BP 126/76 | HR 58 | Temp 98.7°F | Resp 24 | Ht 67.5 in | Wt 192.0 lb

## 2015-06-13 DIAGNOSIS — R6881 Early satiety: Secondary | ICD-10-CM | POA: Diagnosis not present

## 2015-06-13 DIAGNOSIS — D123 Benign neoplasm of transverse colon: Secondary | ICD-10-CM | POA: Diagnosis not present

## 2015-06-13 DIAGNOSIS — R195 Other fecal abnormalities: Secondary | ICD-10-CM

## 2015-06-13 DIAGNOSIS — Z800 Family history of malignant neoplasm of digestive organs: Secondary | ICD-10-CM

## 2015-06-13 DIAGNOSIS — R1310 Dysphagia, unspecified: Secondary | ICD-10-CM

## 2015-06-13 DIAGNOSIS — R197 Diarrhea, unspecified: Secondary | ICD-10-CM | POA: Diagnosis not present

## 2015-06-13 MED ORDER — SODIUM CHLORIDE 0.9 % IV SOLN: 500.0000 mL | INTRAVENOUS | Status: DC

## 2015-06-13 NOTE — Patient Instructions (Signed)
YOU HAD AN ENDOSCOPIC PROCEDURE TODAY AT [facility]:   Refer to the procedure report that was given to you for any specific questions about what was found during the examination.  If the procedure report does not answer your questions, please call your gastroenterologist to clarify.  If you requested that your care partner not be given the details of your procedure findings, then the procedure report has been included in a sealed envelope for you to review at your convenience later.  YOU SHOULD EXPECT: Some feelings of bloating in the abdomen. Passage of more gas than usual.  Walking can help get rid of the air that was put into your GI tract during the procedure and reduce the bloating. If you had a lower endoscopy (such as a colonoscopy or flexible sigmoidoscopy) you may notice spotting of blood in your stool or on the toilet paper. If you underwent a bowel prep for your procedure, you may not have a normal bowel movement for a few days.  Please Note:  You might notice some irritation and congestion in your nose or some drainage.  This is from the oxygen used during your procedure.  There is no need for concern and it should clear up in a day or so.  SYMPTOMS TO REPORT IMMEDIATELY:   Following lower endoscopy (colonoscopy or flexible sigmoidoscopy):  Excessive amounts of blood in the stool  Significant tenderness or worsening of abdominal pains  Swelling of the abdomen that is new, acute  Fever of 100F or higher   Following upper endoscopy (EGD)  Vomiting of blood or coffee ground material  New chest pain or pain under the shoulder blades  Painful or persistently difficult swallowing  New shortness of breath  Fever of 100F or higher  Black, tarry-looking stools  For urgent or emergent issues, a gastroenterologist can be reached at any hour by calling [phone].   DIET: FOLLOW DILATION DIET- SEE HANDOUT Drink plenty of fluids but you should avoid alcoholic  beverages for 24 hours.  ACTIVITY:  You should plan to take it easy for the rest of today and you should NOT DRIVE or use heavy machinery until tomorrow (because of the sedation medicines used during the test).    FOLLOW UP: Our staff will call the number listed on your records the next business day following your procedure to check on you and address any questions or concerns that you may have regarding the information given to you following your procedure. If we do not reach you, we will leave a message.  However, if you are feeling well and you are not experiencing any problems, there is no need to return our call.  We will assume that you have returned to your regular daily activities without incident.  If any biopsies were taken you will be contacted by phone or by letter within the next 1-3 weeks.  Please call us at [phone] if you have not heard about the biopsies in 3 weeks.    Continue your normal medications  Please read over handouts about polyps, diverticulosis, high fiber diets, esophageal stricture and hiatal hernia  Follow dilation diet today  SIGNATURES/CONFIDENTIALITY: You and/or your care partner have signed paperwork which will be entered into your electronic medical record.  These signatures attest to the fact that that the information above on your After Visit Summary has been reviewed and is understood.  Full responsibility of the confidentiality of this discharge information lies with you and/or your care-partner.

## 2015-06-13 NOTE — Op Note (Signed)
[place]  [address] [place], [address]   COLONOSCOPY PROCEDURE REPORT  PATIENT: Jose, Leonard  MR#: [id number] BIRTHDATE: 11-18-1951 , 2  yrs. old GENDER: male ENDOSCOPIST: Jerene Bears, MD REFERRED TD:DUKGUR Wilson Singer, M.D. PROCEDURE DATE:  06/13/2015 PROCEDURE:   Colonoscopy, screening, Colonoscopy with biopsy, and Colonoscopy with snare polypectomy First Screening Colonoscopy - Avg.  risk and is 50 yrs.  old or older - No.  Prior Negative Screening - Now for repeat screening. N/A  History of Adenoma - Now for follow-up colonoscopy & has been > or = to 3 yrs.  N/A  Polyps removed today? Yes ASA CLASS:   Class III INDICATIONS:patient's immediate family history of colon cancer and chronic diarrhea. MEDICATIONS: Monitored anesthesia care, this was the total dose used for all procedures at this session, and Propofol 280 mg IV  DESCRIPTION OF PROCEDURE:   After the risks benefits and alternatives of the procedure were thoroughly explained, informed consent was obtained.  The digital rectal exam revealed no rectal mass.   The LB [id number]  endoscope was introduced through the anus and advanced to the terminal ileum which was intubated for a short distance. No adverse events experienced.   The quality of the prep was good.  (Suprep was used)  The instrument was then slowly withdrawn as the colon was fully examined. Estimated blood loss is zero unless otherwise noted in this procedure report.    COLON FINDINGS: The examined terminal ileum appeared to be normal. Two sessile polyps measuring 5 mm in size were found in the transverse colon and at the hepatic flexure.  Polypectomies were performed with a cold snare.  The resection was complete, the polyp tissue was completely retrieved and sent to histology.   A pedunculated polyp measuring 6 mm in size was found in the transverse colon.  A polypectomy was performed using snare cautery. The resection  was complete, the polyp tissue was completely retrieved and sent to histology.   There was mild diverticulosis noted in the left colon.   The colonic mucosa appeared otherwise normal throughout the entire examined colon.  Multiple random biopsies were performed using cold forceps.  Samples were sent to R/O microscopic colitis.  Retroflexed views revealed internal hemorrhoids. The time to cecum = 0.2 Withdrawal time = 12.2   The scope was withdrawn and the procedure completed.  COMPLICATIONS: There were no immediate complications.  ENDOSCOPIC IMPRESSION: 1.   The examined terminal ileum appeared to be normal 2.   Two sessile polyps were found in the transverse colon and at the hepatic flexure; polypectomies were performed with a cold snare  3.   Pedunculated polyp was found in the transverse colon; polypectomy was performed using snare cautery 4.   Mild diverticulosis was noted in the left colon 5.   The colonic mucosa appeared otherwise normal throughout the entire examined colon; multiple random biopsies were performed using cold forceps  RECOMMENDATIONS: 1.  Await pathology results 2.  Timing of repeat colonoscopy will be determined by pathology findings. 3.  You will receive a letter within 1-2 weeks with the results of your biopsy as well as final recommendations.  Please call my office if you have not received a letter after 3 weeks. 4.  Office follow-up in 2-3 months  eSigned:  Jerene Bears, MD 06/13/2015 2:43 PM   cc: Gareth Eagle, MD and The Patient   PATIENT NAME:  Jose Leonard, Jose Leonard MR#: [id number]

## 2015-06-13 NOTE — Op Note (Signed)
[place]  [address]. [place], [address]   ENDOSCOPY PROCEDURE REPORT  PATIENT: Jose, Leonard  MR#: [id number] BIRTHDATE: July 22, 1951 , 61  yrs. old GENDER: male ENDOSCOPIST: Jerene Bears, MD PROCEDURE DATE:  06/13/2015 PROCEDURE:  EGD, diagnostic, EGD w/ balloon dilation, and EGD w/ biopsy ASA CLASS:     Class III INDICATIONS:  dysphagia and early satiety, chronic loose stools. MEDICATIONS: Monitored anesthesia care, Propofol 250 mg IV, and this was the total dose used for all procedures at this session TOPICAL ANESTHETIC: none  DESCRIPTION OF PROCEDURE: After the risks benefits and alternatives of the procedure were thoroughly explained, informed consent was obtained.  The LB [id number] endoscope was introduced through the mouth and advanced to the second portion of the duodenum , Without limitations.  The instrument was slowly withdrawn as the mucosa was fully examined.  ESOPHAGUS: A Schatzki ring was found 35 cm from the incisors.  Using a TTS-balloon the stricture was dilated up to 16.5 mm (no resistance at 15 mm).  The balloon was held inflated for 60 seconds.  Following this dilation, there was a small amount of heme.   The mucosa of the esophagus appeared normal.  STOMACH: A 3 cm hiatal hernia was noted.   The mucosa of the stomach appeared normal.  DUODENUM: The duodenal mucosa showed no abnormalities in the bulb and 2nd part of the duodenum.  Cold forcep biopsies were taken in the second portion.  Retroflexed views revealed a hiatal hernia.     The scope was then withdrawn from the patient and the procedure completed.  COMPLICATIONS: There were no immediate complications.  ENDOSCOPIC IMPRESSION: 1.   Schatzki ring was found 35 cm from the incisors; Using a TTS-balloon the stricture was dilated up to 36mm; The balloon was held inflated for 60 seconds; Following this dilation, there was a small amount of heme 2.   The mucosa of  the esophagus appeared normal 3.   3 cm hiatal hernia 4.   The mucosa of the stomach appeared normal 5.   The duodenal mucosa showed no abnormalities in the bulb and 2nd part of the duodenum cold forcep biopsies were taken in the second portion  RECOMMENDATIONS: 1.  Await pathology results 2.  Repeat esophageal dilation as needed   eSigned:  Jerene Bears, MD 06/13/2015 2:39 PM    KP:QAESLP Wilson Singer, MD and The Patient  PATIENT NAME:  Jose, Leonard MR#: [id number]

## 2015-06-13 NOTE — Progress Notes (Signed)
Called to room to assist during endoscopic procedure.  Patient ID and intended procedure confirmed with present staff. Received instructions for my participation in the procedure from the performing physician.  

## 2015-06-13 NOTE — Progress Notes (Signed)
A/ox3 pleased with MAC, report to Kristen RN 

## 2015-06-14 ENCOUNTER — Telehealth: Payer: Self-pay | Admitting: *Deleted

## 2015-06-14 NOTE — Telephone Encounter (Signed)
  Follow up Call-  Call back number 06/13/2015  Post procedure Call Back phone  # [phone]  Permission to leave phone message Yes     Patient questions:  Do you have a fever, pain , or abdominal swelling? No. Pain Score  0 *  Have you tolerated food without any problems? Yes.    Have you been able to return to your normal activities? Yes.    Do you have any questions about your discharge instructions: Diet   No. Medications  No. Follow up visit  No.  Do you have questions or concerns about your Care? No.  Actions: * If pain score is 4 or above: No action needed, pain <4.

## 2015-06-19 ENCOUNTER — Encounter: Payer: Self-pay | Admitting: Internal Medicine

## 2015-07-22 ENCOUNTER — Encounter: Payer: Self-pay | Admitting: *Deleted

## 2015-08-28 ENCOUNTER — Ambulatory Visit: Payer: Medicare Other | Admitting: Internal Medicine

## 2016-03-05 ENCOUNTER — Telehealth: Payer: Self-pay | Admitting: Cardiovascular Disease

## 2016-03-05 NOTE — Telephone Encounter (Signed)
Returned call to patient's wife.She stated husband has been out of ranexa and vytorin for the past 2 to 3 months.Stated they do not have enough money to buy medication.Stated husband is having memory problems.Advised he has not seen Dr.Kelly since 03/27/2014.Appointment scheduled with Ignacia Bayley PA 03/12/2016 at 9:30 am at [facility] office.

## 2016-03-05 NOTE — Telephone Encounter (Signed)
Patient calling the office for samples of medication:   1.  What medication and dosage are you requesting samples for?Ranexa and Vytorin  2.  Are you currently out of this medication? Yes-If no samples,pt will need to be change to another medicine for each one of them.Pt can not afford because the insurance changed the tier.

## 2016-03-12 ENCOUNTER — Ambulatory Visit (INDEPENDENT_AMBULATORY_CARE_PROVIDER_SITE_OTHER): Payer: Medicare Other | Admitting: Nurse Practitioner

## 2016-03-12 ENCOUNTER — Encounter: Payer: Self-pay | Admitting: Nurse Practitioner

## 2016-03-12 ENCOUNTER — Telehealth: Payer: Self-pay | Admitting: Cardiovascular Disease

## 2016-03-12 ENCOUNTER — Ambulatory Visit (INDEPENDENT_AMBULATORY_CARE_PROVIDER_SITE_OTHER): Payer: Medicare Other

## 2016-03-12 VITALS — BP 143/84 | HR 51 | Ht 68.0 in | Wt 165.0 lb

## 2016-03-12 DIAGNOSIS — I119 Hypertensive heart disease without heart failure: Secondary | ICD-10-CM | POA: Insufficient documentation

## 2016-03-12 DIAGNOSIS — R55 Syncope and collapse: Secondary | ICD-10-CM

## 2016-03-12 DIAGNOSIS — I2510 Atherosclerotic heart disease of native coronary artery without angina pectoris: Secondary | ICD-10-CM

## 2016-03-12 DIAGNOSIS — R001 Bradycardia, unspecified: Secondary | ICD-10-CM

## 2016-03-12 DIAGNOSIS — E785 Hyperlipidemia, unspecified: Secondary | ICD-10-CM | POA: Insufficient documentation

## 2016-03-12 LAB — COMPREHENSIVE METABOLIC PANEL
ALBUMIN: 3.9 g/dL (ref 3.6–5.1)
ALK PHOS: 67 U/L (ref 40–115)
ALT: 19 U/L (ref 9–46)
AST: 20 U/L (ref 10–35)
BILIRUBIN TOTAL: 0.9 mg/dL (ref 0.2–1.2)
BUN: 6 mg/dL — ABNORMAL LOW (ref 7–25)
CALCIUM: 9.6 mg/dL (ref 8.6–10.3)
CO2: 26 mmol/L (ref 20–31)
Chloride: 102 mmol/L (ref 98–110)
Creat: 0.91 mg/dL (ref 0.70–1.25)
GLUCOSE: 103 mg/dL — AB (ref 65–99)
Potassium: 5.1 mmol/L (ref 3.5–5.3)
Sodium: 137 mmol/L (ref 135–146)
TOTAL PROTEIN: 7.2 g/dL (ref 6.1–8.1)

## 2016-03-12 LAB — CBC
HEMATOCRIT: 44.2 % (ref 38.5–50.0)
HEMOGLOBIN: 14.8 g/dL (ref 13.2–17.1)
MCH: 30.1 pg (ref 27.0–33.0)
MCHC: 33.5 g/dL (ref 32.0–36.0)
MCV: 90 fL (ref 80.0–100.0)
MPV: 11.2 fL (ref 7.5–12.5)
Platelets: 283 10*3/uL (ref 140–400)
RBC: 4.91 MIL/uL (ref 4.20–5.80)
RDW: 14.6 % (ref 11.0–15.0)
WBC: 7 10*3/uL (ref 3.8–10.8)

## 2016-03-12 LAB — LIPID PANEL
CHOLESTEROL: 183 mg/dL (ref 125–200)
HDL: 49 mg/dL (ref >=40)
LDL Cholesterol: 111 mg/dL (ref <130)
TRIGLYCERIDES: 113 mg/dL (ref <150)
Total CHOL/HDL Ratio: 3.7 Ratio (ref <=5.0)
VLDL: 23 mg/dL (ref <30)

## 2016-03-12 LAB — TSH: TSH: 3.23 m[IU]/L (ref 0.40–4.50)

## 2016-03-12 MED ORDER — EZETIMIBE 10 MG PO TABS: 10.0000 mg | ORAL_TABLET | Freq: Every day | ORAL | Status: DC

## 2016-03-12 MED ORDER — SIMVASTATIN 40 MG PO TABS: 40.0000 mg | ORAL_TABLET | Freq: Every day | ORAL | Status: DC

## 2016-03-12 MED ORDER — EZETIMIBE-SIMVASTATIN 10-40 MG PO TABS: 1.0000 | ORAL_TABLET | Freq: Every day | ORAL | Status: DC

## 2016-03-12 NOTE — Telephone Encounter (Signed)
Spoke with Gerald Stabs B NP and patient has not been taking Ranexa, ok to continue off Will change Vytorin to generic Zetia and Simvastatin Advised wife, verbalized understanding Rx sent to [facility] and cancelled Vytorin

## 2016-03-12 NOTE — Progress Notes (Signed)
Office Visit    Patient Name: Jose Leonard Date of Encounter: 03/12/2016  Primary Care Provider:  Dwan Bolt, MD Primary Cardiologist:  Corky Downs, MD   Chief Complaint    65 year old male with a history of CAD, hypertension, hyperlipidemia who presents for follow-up after nearly 2 years.  Past Medical History    Past Medical History  Diagnosis Date  . Hyperlipidemia   . BPH (benign prostatic hyperplasia)   . COPD (chronic obstructive pulmonary disease) ([place])   . Memory loss   . Anxiety state, unspecified   . Arthropathy, unspecified, site unspecified   . CAD (coronary artery disease)     a. 8/20105 CABG x 4 (LIMA->LAD, RIMA->RCA, VG->Diag, VG->OM); b. 11/2008 Cath: patent VG's with atretic LIMA/RIMA. RCA 50; c. 12/2011 MV: EF 62%, no ischemia.  . Family history of other neurological diseases   . Gallstones   . Hiatal hernia   . Diverticulosis   . Asthma   . Schatzki's ring   . Tubular adenoma of colon   . Presyncope and Syncope     a. Syncopal spell @ home 01/2016.  Marland Kitchen Hypertensive heart disease     a. 07/2005 Echo: EF nl, mildly dil LA/RA, mild TR/AI.   Past Surgical History  Procedure Laterality Date  . Angioplasty    . Rotator cuff repair Bilateral   . Cardiac catheterization  08/22/2004  . Rotator cuff repair  [id number]  . Rotator cuff repair  01/2003  . Cardiac catheterization  12/17/2008  . Coronary artery bypass graft  2008    x4  . Coronary artery bypass graft  2005    x3    Allergies  Allergies  Allergen Reactions  . Penicillins     Childhood allergy    History of Present Illness    65 year old male with the above complex past medical history. He is status post coronary artery bypass grafting in 2005 with subsequent finding of patent vein grafts and occluded arterial grafts by catheterization in 2010. He has since been medically managed and had a nonischemic Myoview in February 2013. He was last seen here in clinic in 2015. He says that  since that time, he has done reasonably well. He walks at least 30 minutes 3 times a day, with his dogs, and is able to do so without expressing chest pain or dyspnea. Both he and his daughter say that he stays active throughout the day almost never sits down. He has been experiencing intermittent lightheadedness, especially occurring when moving from a seated to a standing position. This generally does not last long but he has noticed that he has to steady himself and has fallen without loss of consciousness a few times over the past year. His daughter points out that he lost consciousness at one point last month. Upon reflection, patient says he was walking towards his back door while still in the house, and then he saw something very bright, which he believes was the son come to the window, and then he lost consciousness suddenly. He is not sure how long he was unresponsive. He does not remember falling. His wife found him an undetermined amount of time later and he says he was asymptomatic by the time she came to his side. He did not seek evaluation. He has had no recurrence of syncope.  He denies PND, orthopnea, edema, or early satiety.  Home Medications    Prior to Admission medications   Medication Sig Start Date End Date Taking?  Authorizing Provider  acetaminophen (TYLENOL) 500 MG tablet Take 1,000 mg by mouth every 8 (eight) hours as needed for mild pain or headache.   Yes Historical Provider, MD  albuterol (PROVENTIL HFA;VENTOLIN HFA) 108 (90 BASE) MCG/ACT inhaler Inhale 1-2 puffs into the lungs every 6 (six) hours as needed for wheezing or shortness of breath.   Yes Historical Provider, MD  alfuzosin (UROXATRAL) 10 MG 24 hr tablet Take 10 mg by mouth daily.  09/08/13  Yes Historical Provider, MD  aspirin EC 81 MG tablet Take 81 mg by mouth daily.   Yes Historical Provider, MD  chlorpheniramine (EQ CHLORTABS) 4 MG tablet Take 4 mg by mouth at bedtime as needed for allergies.   Yes Historical  Provider, MD  cyclobenzaprine (FLEXERIL) 10 MG tablet Take 10 mg by mouth 3 (three) times daily as needed for muscle spasms.  04/11/13  Yes Historical Provider, MD  escitalopram (LEXAPRO) 10 MG tablet Take 10 mg by mouth daily.  05/10/13  Yes Historical Provider, MD  ezetimibe-simvastatin (VYTORIN) 10-40 MG tablet Take 1 tablet by mouth daily. 03/12/16  Yes Rogelia Mire, NP  Fluticasone-Salmeterol (ADVAIR DISKUS) 100-50 MCG/DOSE AEPB Inhale 1 puff into the lungs 2 (two) times daily as needed (for shortness of breath).    Yes Historical Provider, MD  hydrOXYzine (ATARAX/VISTARIL) 25 MG tablet Take 25 mg by mouth as needed. 04/30/15  Yes Historical Provider, MD  isosorbide mononitrate (IMDUR) 60 MG 24 hr tablet Take 30 mg by mouth daily.   Yes Historical Provider, MD  niacin (NIASPAN) 1000 MG CR tablet Take 1,000 mg by mouth at bedtime.  04/26/13  Yes Historical Provider, MD  Omega-3 Fatty Acids (FISH OIL) 1200 MG CAPS Take 1,200 mg by mouth daily.   Yes Historical Provider, MD    Review of Systems    As above, he has had intermittent, orthostatic type lightheadedness when moving from a seated to standing position. He had at least one syncopal episode which has not previously been evaluated. He denies chest pain, palpitations, PND, orthopnea, or edema.  All other systems reviewed and are otherwise negative except as noted above.  Physical Exam    VS:  BP 143/84 mmHg  Pulse 51  Ht 5\' 8"  (1.727 m)  Wt 165 lb (74.844 kg)  BMI 25.09 kg/m2 , BMI Body mass index is 25.09 kg/(m^2).  Blood pressure lying 129/77 with a heart rate of 51; blood pressure sitting 129/77 with heart rate of 51; blood pressure standing 127/79 with heart rate of 60; blood pressure standing 3 minutes 127/80 with heart rate of 61. GEN: Well nourished, well developed, in no acute distress. HEENT: normal. Neck: Supple, no JVD, carotid bruits, or masses. Cardiac: RRR, no murmurs, rubs, or gallops. No clubbing, cyanosis, edema.   Radials/DP/PT 2+ and equal bilaterally.  Respiratory:  Respirations regular and unlabored, clear to auscultation bilaterally. GI: Soft, nontender, nondistended, BS + x 4. MS: no deformity or atrophy. Skin: warm and dry, no rash. Neuro:  Strength and sensation are intact. Psych: Normal affect.  Accessory Clinical Findings    ECG - Sinus bradycardia, 47, no evidence of high-grade heart block, prior septal infarct, no acute ST or T changes.  Assessment & Plan    1.  Coronary artery disease: Patient is status post prior CABG with patent vein grafts and atretic mammary grafts by catheterization in 2010, followed by nonischemic Myoview in 2013. He has done well without chest pain or dyspnea over the past 2 years. He remains  on aspirin and nitrate therapy. He ran out of Ranexa and Vytorin probably over a year ago. I will refill his Vytorin today. As he has not been having any chest pain, I will not resume Ranexa at this time.  2. Sinus bradycardia: Patient's heart rate is 47. He is not symptomatic and his blood pressure is stable. Heart rate does rise into the 60s when checking orthostatic vital signs. He is not on any AV nodal blocking agent. I am placing an event monitor in the setting of recent spells of lightheadedness and also syncope last month.  3. Presyncope and syncope: Patient has been experiencing lightheadedness which sounds to be mostly positional. He is not orthostatic by blood pressure today. Heart rate comes up slightly. He also had a syncopal episode about one month ago while walking in his home. He was unconscious for an unclear amount of time as he does not remember the event well and he does not know how long he was down prior to his wife finding him. As above, I am placing an event monitor and also will repeat an echocardiogram as it has been 11 years. I will also check labs including a CBC, complete metabolic panel, and TSH.  4. Hyperlipidemia: He was present Vytorin but not in the  past year. I will resume this and also check lipids and LFTs today as he is fasting.  5. Disposition: Follow-up testing as noted above including labs, echo, and event monitor. Follow-up with Dr. Claiborne Billings in 3-6 months or sooner if necessary.   Murray Hodgkins, NP 03/12/2016, 1:21 PM

## 2016-03-12 NOTE — Patient Instructions (Signed)
Medication Instructions: Your physician recommends that you continue on your current medications as directed. Please refer to the Current Medication list given to you today.  Labwork: Your physician recommends that you return for lab work TODAY.  Testing/Procedures: 1. Echocardiogram - Your physician has requested that you have an echocardiogram. Echocardiography is a painless test that uses sound waves to create images of your heart. It provides your doctor with information about the size and shape of your heart and how well your heart's chambers and valves are working. This procedure takes approximately one hour. There are no restrictions for this procedure.  2. 30-Day Cardiac Event Monitor - Your physician has recommended that you wear an event monitor. Event monitors are medical devices that record the heart's electrical activity. Doctors most often [place] these monitors to diagnose arrhythmias. Arrhythmias are problems with the speed or rhythm of the heartbeat. The monitor is a small, portable device. You can wear one while you do your normal daily activities. This is usually used to diagnose what is causing palpitations/syncope (passing out).  Follow-up: Ignacia Bayley, NP, recommends that you schedule a follow-up appointment in 6 weeks with a NP/PA.  If you need a refill on your cardiac medications before your next appointment, please call your pharmacy.

## 2016-03-12 NOTE — Telephone Encounter (Signed)
New message   Patient was seen today by APP.    Wife calling they cannot  afford the renexa and Vytorin due to cost $  170.00 went to [facility] .  Need an Investment banker, operational

## 2016-03-20 ENCOUNTER — Telehealth: Payer: Self-pay | Admitting: Cardiovascular Disease

## 2016-03-20 NOTE — Telephone Encounter (Signed)
LEFT MESSAGE TO CALL BACK   MAY CALL ON CALL PROVIDER IF NEEDED

## 2016-03-20 NOTE — Telephone Encounter (Signed)
Pt's wife called in stating that the pt will be removing his current monitor until his new one arrives. He says complaining of the electrodes irritating his skin and the current monitor is malfunctioning. The wife wanted to know if this all right to do. Please f/u with her  Thanks

## 2016-03-23 NOTE — Telephone Encounter (Signed)
No answer. Left message to call back if he had any questions about the monitor or if he has any questions.

## 2016-03-31 ENCOUNTER — Telehealth (HOSPITAL_COMMUNITY): Payer: Self-pay | Admitting: Radiology

## 2016-03-31 NOTE — Telephone Encounter (Signed)
Spoke with patient to confirm appointment. Patient voiced concerns regarding EKG leads for Holter Monitor. He stated the leads were irritating his skin. He called the company and they sent him another type which were also irritating his skin. It was stated that he took the leads off for the night an replaced them this morning. I told him I would contact the monitor tech and them call him back.

## 2016-03-31 NOTE — Telephone Encounter (Signed)
Monitor was applied at the [facility] office.  Message to be routed to [employer], [place].

## 2016-03-31 NOTE — Telephone Encounter (Signed)
Returned call to patient.  Spoke with wife, Abigail Butts ([place]). Patient is currently not wearing monitor "because the stickers were irritating him so bad. His skin was blood red - even with the sensitive skin stickers." See previous telephone encounter.  Gave wife the okay to pack everything up and send it off to the company.  Will forward to [place], NP (ordering provider) as Juluis Rainier.

## 2016-04-01 ENCOUNTER — Other Ambulatory Visit: Payer: Self-pay

## 2016-04-01 ENCOUNTER — Ambulatory Visit (HOSPITAL_COMMUNITY): Payer: Medicare Other | Attending: Internal Medicine

## 2016-04-01 DIAGNOSIS — E785 Hyperlipidemia, unspecified: Secondary | ICD-10-CM | POA: Diagnosis not present

## 2016-04-01 DIAGNOSIS — Z951 Presence of aortocoronary bypass graft: Secondary | ICD-10-CM | POA: Diagnosis not present

## 2016-04-01 DIAGNOSIS — I351 Nonrheumatic aortic (valve) insufficiency: Secondary | ICD-10-CM | POA: Insufficient documentation

## 2016-04-01 DIAGNOSIS — I071 Rheumatic tricuspid insufficiency: Secondary | ICD-10-CM | POA: Insufficient documentation

## 2016-04-01 DIAGNOSIS — R55 Syncope and collapse: Secondary | ICD-10-CM | POA: Insufficient documentation

## 2016-04-01 DIAGNOSIS — I2510 Atherosclerotic heart disease of native coronary artery without angina pectoris: Secondary | ICD-10-CM | POA: Insufficient documentation

## 2016-04-01 DIAGNOSIS — I340 Nonrheumatic mitral (valve) insufficiency: Secondary | ICD-10-CM | POA: Insufficient documentation

## 2016-04-01 DIAGNOSIS — I119 Hypertensive heart disease without heart failure: Secondary | ICD-10-CM | POA: Diagnosis not present

## 2016-04-01 DIAGNOSIS — I358 Other nonrheumatic aortic valve disorders: Secondary | ICD-10-CM | POA: Diagnosis not present

## 2016-04-23 ENCOUNTER — Ambulatory Visit (INDEPENDENT_AMBULATORY_CARE_PROVIDER_SITE_OTHER): Payer: Medicare Other | Admitting: Physician Assistant

## 2016-04-23 ENCOUNTER — Encounter: Payer: Self-pay | Admitting: Physician Assistant

## 2016-04-23 VITALS — BP 120/74 | HR 60 | Ht 68.0 in | Wt 182.6 lb

## 2016-04-23 DIAGNOSIS — Z79899 Other long term (current) drug therapy: Secondary | ICD-10-CM

## 2016-04-23 DIAGNOSIS — E785 Hyperlipidemia, unspecified: Secondary | ICD-10-CM

## 2016-04-23 MED ORDER — LOVASTATIN 20 MG PO TABS: 20.0000 mg | ORAL_TABLET | Freq: Every day | ORAL | Status: DC

## 2016-04-23 MED ORDER — LOVASTATIN 20 MG PO TABS: 20.0000 mg | ORAL_TABLET | ORAL | Status: DC

## 2016-04-23 NOTE — Progress Notes (Signed)
Cardiology Office Note   Date:  04/23/2016   ID:  Jose Leonard, DOB 12-29-1950, MRN [id number]  PCP:  Sarina Ser, MD  Cardiologist:  Dr Meredeth Ide, PA-C   No chief complaint on file.   History of Present Illness: Jose Leonard is a 65 y.o. male with a history of CAD, hypertension, hyperlipidemia.  Seen 04/20 by CB in follow-up after nearly 2 years and echo plus event monitor ordered for syncope. Sinus brady 47 on ECG. Event monitor ended early because skin irritation from stickers  Jose Leonard presents for follow-up of the monitor and echo.  Jose Leonard has had no more syncope. The episodes of orthostatic presyncope have decreased. He states they are very brief and if he will hold onto something when he stands up, they resolve and a second or 2. He has not felt like he was in danger of falling or losing consciousness. He has not had any chest pain or palpitations.  He admits that he drinks tea and soda's, no water at all. He is trying to get out more and be active. He walks the dog multiple times a day and is trying to do a little bit more with that. He cannot afford Zetia and Zocor and wonders if there is anything less expensive he can take. He has been diagnosed with very low testosterone. Testosterone injections were ordered by the physician, but he has not been able to afford those either and has not started the medication. He was previously on the gel.   Past Medical History  Diagnosis Date  . Hyperlipidemia   . BPH (benign prostatic hyperplasia)   . COPD (chronic obstructive pulmonary disease) ([place])   . Memory loss   . Anxiety state, unspecified   . Arthropathy, unspecified, site unspecified   . CAD (coronary artery disease)     a. 8/20105 CABG x 4 (LIMA->LAD, RIMA->RCA, VG->Diag, VG->OM); b. 11/2008 Cath: patent VG's with atretic LIMA/RIMA. RCA 50; c. 12/2011 MV: EF 62%, no ischemia.  . Family history of other neurological diseases   .  Gallstones   . Hiatal hernia   . Diverticulosis   . Asthma   . Schatzki's ring   . Tubular adenoma of colon   . Presyncope and Syncope     a. Syncopal spell @ home 01/2016.  Marland Kitchen Hypertensive heart disease     a. 07/2005 Echo: EF nl, mildly dil LA/RA, mild TR/AI.    Past Surgical History  Procedure Laterality Date  . Angioplasty    . Rotator cuff repair Bilateral   . Cardiac catheterization  08/22/2004  . Rotator cuff repair  [id number]  . Rotator cuff repair  01/2003  . Cardiac catheterization  12/17/2008  . Coronary artery bypass graft  2008    x4  . Coronary artery bypass graft  2005    x3    Current Outpatient Prescriptions  Medication Sig Dispense Refill  . acetaminophen (TYLENOL) 500 MG tablet Take 1,000 mg by mouth every 8 (eight) hours as needed for mild pain or headache.    . albuterol (PROVENTIL HFA;VENTOLIN HFA) 108 (90 BASE) MCG/ACT inhaler Inhale 1-2 puffs into the lungs every 6 (six) hours as needed for wheezing or shortness of breath.    . alfuzosin (UROXATRAL) 10 MG 24 hr tablet Take 10 mg by mouth daily.     Marland Kitchen aspirin EC 81 MG tablet Take 81 mg by mouth daily.    . chlorpheniramine (EQ CHLORTABS) 4  MG tablet Take 4 mg by mouth at bedtime as needed for allergies.    . cyclobenzaprine (FLEXERIL) 10 MG tablet Take 10 mg by mouth 3 (three) times daily as needed for muscle spasms.     Marland Kitchen escitalopram (LEXAPRO) 10 MG tablet Take 10 mg by mouth daily.     Marland Kitchen ezetimibe (ZETIA) 10 MG tablet Take 1 tablet (10 mg total) by mouth at bedtime. 30 tablet 11  . Fluticasone-Salmeterol (ADVAIR DISKUS) 100-50 MCG/DOSE AEPB Inhale 1 puff into the lungs 2 (two) times daily as needed (for shortness of breath).     . hydrOXYzine (ATARAX/VISTARIL) 25 MG tablet Take 25 mg by mouth as needed.    . isosorbide mononitrate (IMDUR) 60 MG 24 hr tablet Take 30 mg by mouth daily.    . niacin (NIASPAN) 1000 MG CR tablet Take 1,000 mg by mouth at bedtime.     . Omega-3 Fatty Acids (FISH OIL) 1200 MG  CAPS Take 1,200 mg by mouth daily.    . simvastatin (ZOCOR) 40 MG tablet Take 1 tablet (40 mg total) by mouth at bedtime. 30 tablet 11   No current facility-administered medications for this visit.    Allergies:   Penicillins    Social History:  The patient  reports that he quit smoking about 32 years ago. He has never used smokeless tobacco. He reports that he drinks about 0.6 oz of alcohol per week. He reports that he does not use illicit drugs.   Family History:  The patient's family history includes Arthritis in his brother, maternal grandfather, mother, and sister; Asthma in his brother and mother; Cancer in his daughter and paternal grandfather; Colon cancer in his mother; Coronary artery disease in his father; Dementia in his father and mother; Diabetes in his father and maternal grandfather; Heart attack in his mother; Parkinson's disease in his brother and maternal uncle; Stroke in his father and maternal grandmother.    ROS:  Please see the history of present illness. All other systems are reviewed and negative.    PHYSICAL EXAM: VS:  BP 120/74 mmHg  Pulse 60  Ht 5\' 8"  (1.727 m)  Wt 182 lb 9.6 oz (82.827 kg)  BMI 27.77 kg/m2 , BMI Body mass index is 27.77 kg/(m^2). GEN: Well nourished, well developed, male in no acute distress HEENT: normal for age  Neck: no JVD, no carotid bruit, no masses Cardiac: RRR; soft murmur, no rubs, or gallops Respiratory:  clear to auscultation bilaterally, normal work of breathing GI: soft, nontender, nondistended, + BS MS: no deformity or atrophy; no edema; distal pulses are 2+ in all 4 extremities  Skin: warm and dry, no rash Neuro:  Strength and sensation are intact Psych: euthymic mood, full affect   EKG:  EKG is not ordered today.  ECHO: 04/01/2016 - Left ventricle: The cavity size was normal. Wall thickness was  increased in a pattern of mild LVH. Systolic function was normal.  The estimated ejection fraction was in the range of  55% to 60%.  Wall motion was normal; there were no regional wall motion  abnormalities. Doppler parameters are consistent with abnormal  left ventricular relaxation (grade 1 diastolic dysfunction). The  E/e&' ratio is <8, suggesting normal LV filling pressure. - Aortic valve: Trileaflet. Sclerosis without stenosis. There was  trivial regurgitation. - Mitral valve: Mildly thickened leaflets . There was trivial  regurgitation. - Left atrium: The atrium was normal in size. - Right ventricle: The cavity size was mildly dilated. Systolic  function was low normal. - Right atrium: The atrium was at the upper limits of normal in  size. - Tricuspid valve: There was trivial regurgitation. - Pulmonary arteries: PA peak pressure: 26 mm Hg (S). - Inferior vena cava: The vessel was normal in size. The  respirophasic diameter changes were in the normal range (>= 50%),  consistent with normal central venous pressure. Impressions: - LVEF 55-60%, mild LVH, normal wall motion, diastolic dysfunction,  normal LV filling pressure, normal LA size, mildly dilated RV  with low normal systolic function, trivial AI, MR and TR, RVSP 26  mmHg, normal IVC.  Recent Labs: 03/12/2016: ALT 19; BUN 6*; Creat 0.91; Hemoglobin 14.8; Platelets 283; Potassium 5.1; Sodium 137; TSH 3.23    Lipid Panel    Component Value Date/Time   CHOL 183 03/12/2016 1103   TRIG 113 03/12/2016 1103   HDL 49 03/12/2016 1103   CHOLHDL 3.7 03/12/2016 1103   VLDL 23 03/12/2016 1103   LDLCALC 111 03/12/2016 1103     Wt Readings from Last 3 Encounters:  04/23/16 182 lb 9.6 oz (82.827 kg)  03/12/16 165 lb (74.844 kg)  06/13/15 192 lb (87.091 kg)     Other studies Reviewed: Additional studies/ records that were reviewed today include: Event monitor, echocardiogram, previous office notes.  ASSESSMENT AND PLAN:  1.  Syncope: He has had no more episodes of syncope. He is still having the presyncope, but it has  decreased in frequency and intensity. He is to switch some of the soda and tea to water. He is to make sure he stays hydrated. If he continues to have symptoms, he is to call us.  The monitor results were reviewed with the patient. It was not warm for the full 30 days, but did not show any significant tachycardia or bradycardia, no significant ectopy while he was wearing it. He also does not remember having any symptoms while he is wearing it.  2. Hyperlipidemia: On his last profile, his LDL had gone from 66 up to 111. Because of his history of CAD, his goal LDL is 70. He is unable to afford Zetia and Zocor. He is encouraged to take lovastatin 20 mg 2 tablets daily which is $10 for 90 tablets at [facility]. Check lipid and liver in 3 months and decide on medication changes.   Current medicines are reviewed at length with the patient today.  The patient does not have concerns regarding medicines.  The following changes have been made:  Stop Zetia and Zocor, add lovastatin 20 mg, 2 tablets daily  Labs/ tests ordered today include:   Orders Placed This Encounter  Procedures  . Hepatic function panel  . Lipid panel     Disposition:   FU with Dr. Claiborne Billings  Signed, Rosaria Ferries, PA-C  04/23/2016 11:01 AM    Deal Phone: [phone]; Fax: [phone]  This note was written with the assistance of speech recognition software. Please excuse any transcriptional errors.

## 2016-04-23 NOTE — Patient Instructions (Signed)
Medication Instructions: Rosaria Ferries, PA-C, has recommended making the following medication changes: 1. STOP Zetia 2. STOP Simvastatin 3. START Lovastatin 20 mg tablets - take 2 tablet by mouth daily  >>A new prescription has been sent to [facility]: Your physician recommends that you return for lab work in 3 months - FASTING.  Testing/Procedures: NONE ORDERED  Follow-up: Suanne Marker recommends that you schedule a follow-up appointment in 3-6 months with Dr Claiborne Billings. You will receive a reminder letter in the mail two months in advance. If you don't receive a letter, please call our office to schedule the follow-up appointment.  If you need a refill on your cardiac medications before your next appointment, please call your pharmacy.

## 2016-05-18 DIAGNOSIS — E291 Testicular hypofunction: Secondary | ICD-10-CM | POA: Insufficient documentation

## 2016-07-14 ENCOUNTER — Ambulatory Visit: Payer: Self-pay | Admitting: Neurology

## 2017-10-11 ENCOUNTER — Telehealth: Payer: Self-pay | Admitting: *Deleted

## 2017-10-11 NOTE — Telephone Encounter (Signed)
    Chart reviewed as part of pre-operative protocol coverage. Patient was contacted 10/11/2017 in reference to pre-operative risk assessment for pending surgery as outlined below.  Jose Leonard was last seen on 04/23/17 by Rosaria Ferries, PA.  Since that day, Jose Leonard has done well. No cardiac symptoms.   Therefore, based on ACC/AHA guidelines, the patient would be at acceptable risk for the planned procedure without further cardiovascular testing.   He does not need any antibiotics. I will route to Dr. Claiborne Billings to review ASA. Last cath 2010.  [place], [place] 10/11/2017, 4:04 PM

## 2017-10-11 NOTE — Telephone Encounter (Signed)
Okay for dental extraction in okay to hold aspirin for 4-5 days prior to procedure

## 2017-10-11 NOTE — Telephone Encounter (Signed)
   [facility] Pre-operative Risk Assessment    Request for surgical clearance:  1. What type of surgery is being performed? SURGICAL EXTRACTION (ORAL SURGERY)   2. When is this surgery scheduled? TBD   3. Are there any medications that need to be held prior to surgery and how long?  Any antibiotic prophylaxis needed?  4. Practice name and name of physician performing surgery? [facility]    5. What is your office phone and fax number? PHONE [phone] FAX [phone]   6. Anesthesia type (None, local, MAC, general) ? LOCAL ANESTHETIC WITH EPINEPHRINE

## 2017-10-13 NOTE — Telephone Encounter (Signed)
    Chart reviewed as part of pre-operative protocol coverage. Clearance already addressed.  Per Robbie Lis, PA-C, "Therefore, based on ACC/AHA guidelines, the patient would be at acceptable risk for the planned procedure without further cardiovascular testing."  Per Dr. Claiborne Billings, " Okay for dental extraction in okay to hold aspirin for 4-5 days prior to procedure."  Will route this bundled recommendation to requesting provider via Epic fax function. Please call with questions.  Charlie Pitter, PA-C  10/13/2017, 1:19 PM

## 2017-10-21 ENCOUNTER — Telehealth: Payer: Self-pay | Admitting: Cardiovascular Disease

## 2017-10-21 NOTE — Telephone Encounter (Signed)
Vicente Males called from [facility] asking if the patient was cleared for the surgical extraction today. Per Epic note he has been cleared with a hold on aspirin for 4-5 days prior. Vicente Males explained that the patient has held the aspirin. They do not have Epic therefore never received the clearance back. It has been faxed to [phone] per their request.

## 2017-10-21 NOTE — Telephone Encounter (Signed)
Vicente Males calling to follow up on the status of the pre op clearance that was faxed over on 10-11-17. Please fax this asap to [phone].

## 2019-02-05 DIAGNOSIS — K5900 Constipation, unspecified: Secondary | ICD-10-CM | POA: Insufficient documentation

## 2019-02-05 DIAGNOSIS — F0390 Unspecified dementia without behavioral disturbance: Secondary | ICD-10-CM | POA: Insufficient documentation

## 2019-07-26 DIAGNOSIS — Z800 Family history of malignant neoplasm of digestive organs: Secondary | ICD-10-CM | POA: Insufficient documentation

## 2019-08-31 ENCOUNTER — Ambulatory Visit: Payer: Medicare Other | Admitting: Cardiovascular Disease

## 2019-09-24 DIAGNOSIS — K922 Gastrointestinal hemorrhage, unspecified: Secondary | ICD-10-CM

## 2019-09-24 HISTORY — DX: Gastrointestinal hemorrhage, unspecified: K92.2

## 2019-09-28 ENCOUNTER — Inpatient Hospital Stay (HOSPITAL_COMMUNITY)
Admission: EM | Admit: 2019-09-28 | Discharge: 2019-09-30 | DRG: 921 | Disposition: A | Discharge status: Home / Self Care | Payer: Medicare Other | Attending: Internal Medicine | Admitting: Internal Medicine

## 2019-09-28 ENCOUNTER — Other Ambulatory Visit: Payer: Self-pay

## 2019-09-28 ENCOUNTER — Encounter (HOSPITAL_COMMUNITY): Payer: Self-pay | Admitting: Emergency Medicine

## 2019-09-28 DIAGNOSIS — Z7982 Long term (current) use of aspirin: Secondary | ICD-10-CM

## 2019-09-28 DIAGNOSIS — Z880 Allergy status to penicillin: Secondary | ICD-10-CM

## 2019-09-28 DIAGNOSIS — Z825 Family history of asthma and other chronic lower respiratory diseases: Secondary | ICD-10-CM

## 2019-09-28 DIAGNOSIS — Z87891 Personal history of nicotine dependence: Secondary | ICD-10-CM

## 2019-09-28 DIAGNOSIS — I119 Hypertensive heart disease without heart failure: Secondary | ICD-10-CM | POA: Diagnosis present

## 2019-09-28 DIAGNOSIS — F0390 Unspecified dementia without behavioral disturbance: Secondary | ICD-10-CM | POA: Diagnosis present

## 2019-09-28 DIAGNOSIS — E785 Hyperlipidemia, unspecified: Secondary | ICD-10-CM | POA: Diagnosis present

## 2019-09-28 DIAGNOSIS — F329 Major depressive disorder, single episode, unspecified: Secondary | ICD-10-CM | POA: Diagnosis present

## 2019-09-28 DIAGNOSIS — K922 Gastrointestinal hemorrhage, unspecified: Secondary | ICD-10-CM | POA: Diagnosis not present

## 2019-09-28 DIAGNOSIS — Y838 Other surgical procedures as the cause of abnormal reaction of the patient, or of later complication, without mention of misadventure at the time of the procedure: Secondary | ICD-10-CM | POA: Diagnosis present

## 2019-09-28 DIAGNOSIS — J449 Chronic obstructive pulmonary disease, unspecified: Secondary | ICD-10-CM | POA: Diagnosis present

## 2019-09-28 DIAGNOSIS — N400 Enlarged prostate without lower urinary tract symptoms: Secondary | ICD-10-CM | POA: Diagnosis present

## 2019-09-28 DIAGNOSIS — Z79899 Other long term (current) drug therapy: Secondary | ICD-10-CM

## 2019-09-28 DIAGNOSIS — I2510 Atherosclerotic heart disease of native coronary artery without angina pectoris: Secondary | ICD-10-CM | POA: Diagnosis present

## 2019-09-28 DIAGNOSIS — K91840 Postprocedural hemorrhage and hematoma of a digestive system organ or structure following a digestive system procedure: Principal | ICD-10-CM | POA: Diagnosis present

## 2019-09-28 DIAGNOSIS — Z91048 Other nonmedicinal substance allergy status: Secondary | ICD-10-CM

## 2019-09-28 DIAGNOSIS — F411 Generalized anxiety disorder: Secondary | ICD-10-CM | POA: Diagnosis present

## 2019-09-28 DIAGNOSIS — Z951 Presence of aortocoronary bypass graft: Secondary | ICD-10-CM

## 2019-09-28 DIAGNOSIS — Z20828 Contact with and (suspected) exposure to other viral communicable diseases: Secondary | ICD-10-CM | POA: Diagnosis present

## 2019-09-28 DIAGNOSIS — M129 Arthropathy, unspecified: Secondary | ICD-10-CM | POA: Diagnosis present

## 2019-09-28 DIAGNOSIS — Z8249 Family history of ischemic heart disease and other diseases of the circulatory system: Secondary | ICD-10-CM

## 2019-09-28 DIAGNOSIS — K5790 Diverticulosis of intestine, part unspecified, without perforation or abscess without bleeding: Secondary | ICD-10-CM | POA: Diagnosis present

## 2019-09-28 DIAGNOSIS — Z8261 Family history of arthritis: Secondary | ICD-10-CM

## 2019-09-28 LAB — COMPREHENSIVE METABOLIC PANEL
ALT: 13 U/L (ref 0–44)
AST: 16 U/L (ref 15–41)
Albumin: 3.3 g/dL — ABNORMAL LOW (ref 3.5–5.0)
Alkaline Phosphatase: 60 U/L (ref 38–126)
Anion gap: 10 (ref 5–15)
BUN: 7 mg/dL — ABNORMAL LOW (ref 8–23)
CO2: 24 mmol/L (ref 22–32)
Calcium: 8.8 mg/dL — ABNORMAL LOW (ref 8.9–10.3)
Chloride: 103 mmol/L (ref 98–111)
Creatinine, Ser: 1.05 mg/dL (ref 0.61–1.24)
GFR calc Af Amer: >60 mL/min (ref >60)
GFR calc non Af Amer: >60 mL/min (ref >60)
Glucose, Bld: 119 mg/dL — ABNORMAL HIGH (ref 70–99)
Potassium: 3.6 mmol/L (ref 3.5–5.1)
Sodium: 137 mmol/L (ref 135–145)
Total Bilirubin: 0.9 mg/dL (ref 0.3–1.2)
Total Protein: 5.8 g/dL — ABNORMAL LOW (ref 6.5–8.1)

## 2019-09-28 LAB — PROTIME-INR
INR: 1 (ref 0.8–1.2)
Prothrombin Time: 13.2 seconds (ref 11.4–15.2)

## 2019-09-28 LAB — CBC WITH DIFFERENTIAL/PLATELET
Abs Immature Granulocytes: 0.03 10*3/uL (ref 0.00–0.07)
Basophils Absolute: 0.1 10*3/uL (ref 0.0–0.1)
Basophils Relative: 1 %
Eosinophils Absolute: 0.3 10*3/uL (ref 0.0–0.5)
Eosinophils Relative: 3 %
HCT: 40.7 % (ref 39.0–52.0)
Hemoglobin: 13.4 g/dL (ref 13.0–17.0)
Immature Granulocytes: 0 %
Lymphocytes Relative: 19 %
Lymphs Abs: 2.1 10*3/uL (ref 0.7–4.0)
MCH: 30.2 pg (ref 26.0–34.0)
MCHC: 32.9 g/dL (ref 30.0–36.0)
MCV: 91.9 fL (ref 80.0–100.0)
Monocytes Absolute: 0.8 10*3/uL (ref 0.1–1.0)
Monocytes Relative: 7 %
Neutro Abs: 7.5 10*3/uL (ref 1.7–7.7)
Neutrophils Relative %: 70 %
Platelets: 245 10*3/uL (ref 150–400)
RBC: 4.43 MIL/uL (ref 4.22–5.81)
RDW: 13.2 % (ref 11.5–15.5)
WBC: 10.8 10*3/uL — ABNORMAL HIGH (ref 4.0–10.5)
nRBC: 0 % (ref 0.0–0.2)

## 2019-09-28 LAB — CBC
HCT: 38.2 % — ABNORMAL LOW (ref 39.0–52.0)
Hemoglobin: 12.6 g/dL — ABNORMAL LOW (ref 13.0–17.0)
MCH: 30.6 pg (ref 26.0–34.0)
MCHC: 33 g/dL (ref 30.0–36.0)
MCV: 92.7 fL (ref 80.0–100.0)
Platelets: 217 10*3/uL (ref 150–400)
RBC: 4.12 MIL/uL — ABNORMAL LOW (ref 4.22–5.81)
RDW: 13.3 % (ref 11.5–15.5)
WBC: 9 10*3/uL (ref 4.0–10.5)
nRBC: 0 % (ref 0.0–0.2)

## 2019-09-28 LAB — HIV ANTIBODY (ROUTINE TESTING W REFLEX): HIV Screen 4th Generation wRfx: NONREACTIVE

## 2019-09-28 LAB — SAMPLE TO BLOOD BANK

## 2019-09-28 LAB — SARS CORONAVIRUS 2 (TAT 6-24 HRS): SARS Coronavirus 2: NEGATIVE

## 2019-09-28 MED ORDER — ALBUTEROL SULFATE HFA 108 (90 BASE) MCG/ACT IN AERS
1.0000 | INHALATION_SPRAY | Freq: Four times a day (QID) | RESPIRATORY_TRACT | Status: DC | PRN
  Filled 2019-09-28: qty 6.7

## 2019-09-28 MED ORDER — MOMETASONE FURO-FORMOTEROL FUM 100-5 MCG/ACT IN AERO
2.0000 | INHALATION_SPRAY | Freq: Two times a day (BID) | RESPIRATORY_TRACT | Status: DC
  Administered 2019-09-29 – 2019-09-30 (×4): 2 via RESPIRATORY_TRACT
  Filled 2019-09-28: qty 8.8

## 2019-09-28 MED ORDER — MEMANTINE HCL ER 28 MG PO CP24
28.0000 mg | ORAL_CAPSULE | Freq: Every day | ORAL | Status: DC
  Administered 2019-09-28 – 2019-09-29 (×2): 28 mg via ORAL
  Filled 2019-09-28 (×3): qty 1

## 2019-09-28 MED ORDER — ESCITALOPRAM OXALATE 10 MG PO TABS
10.0000 mg | ORAL_TABLET | Freq: Every day | ORAL | Status: DC
  Administered 2019-09-29 – 2019-09-30 (×2): 10 mg via ORAL
  Filled 2019-09-28 (×2): qty 1

## 2019-09-28 MED ORDER — NORTRIPTYLINE HCL 10 MG PO CAPS
10.0000 mg | ORAL_CAPSULE | Freq: Every day | ORAL | Status: DC
  Administered 2019-09-28 – 2019-09-29 (×2): 10 mg via ORAL
  Filled 2019-09-28 (×3): qty 1

## 2019-09-28 MED ORDER — DONEPEZIL HCL 10 MG PO TABS
10.0000 mg | ORAL_TABLET | Freq: Every day | ORAL | Status: DC
  Administered 2019-09-28 – 2019-09-29 (×2): 10 mg via ORAL
  Filled 2019-09-28 (×2): qty 1

## 2019-09-28 MED ORDER — TAMSULOSIN HCL 0.4 MG PO CAPS
0.4000 mg | ORAL_CAPSULE | Freq: Every day | ORAL | Status: DC
  Administered 2019-09-28 – 2019-09-30 (×3): 0.4 mg via ORAL
  Filled 2019-09-28 (×3): qty 1

## 2019-09-28 MED ORDER — ALFUZOSIN HCL ER 10 MG PO TB24
10.0000 mg | ORAL_TABLET | Freq: Every day | ORAL | Status: DC
  Administered 2019-09-28 – 2019-09-29 (×2): 10 mg via ORAL
  Filled 2019-09-28 (×2): qty 1

## 2019-09-28 MED ORDER — MEMANTINE HCL-DONEPEZIL HCL ER 28-10 MG PO CP24: 1.0000 | ORAL_CAPSULE | Freq: Every day | ORAL | Status: DC

## 2019-09-28 NOTE — ED Triage Notes (Signed)
Patient reports multiple bloody stools this evening , endoscopy/colonoscopy yesterday at [facility] , denies abdominal pain , no fever or chills. Patient denies taking anticoagulant medication .

## 2019-09-28 NOTE — ED Notes (Signed)
One episode of red bowel movement

## 2019-09-28 NOTE — H&P (Addendum)
Date: 09/28/2019               Patient Name:  OSAGIE DOSKOCIL MRN: [id number]  DOB: 1951/02/18 Age / Sex: 68 y.o., male   PCP: Einar Pheasant, DO         Medical Service: Internal Medicine Teaching Service         Attending Physician: Dr. Lucious Groves, DO    First Contact: Dr. Court Joy Pager: [phone]  Second Contact: Dr. Koleen Distance Pager: [phone]       After Hours (After 5p/  First Contact Pager: [phone]  weekends / holidays): Second Contact Pager: [phone]   Chief Complaint: BRBPR  History of Present Illness: Briggs Alcide is a 68 y/o gentleman with PMHx CAD s/p CABG x4, dementia who presents with BRBPR colonoscopy yesterday with 5 polyps removed without complication at [facility]. Patient also had an EGD and schatzki ring dilated. Patients wife and caretaker reports he has had 14 bloody BMs since yesterday. Caretaker called doctor office who performed procedure and was told to go to ED. Patient denies any abdominal pain, SOB, dizziness, or chest pain.   Meds:  Current Meds  Medication Sig  . acetaminophen (TYLENOL) 500 MG tablet Take 1,000 mg by mouth every 8 (eight) hours as needed for mild pain or headache.  . albuterol (PROVENTIL HFA;VENTOLIN HFA) 108 (90 BASE) MCG/ACT inhaler Inhale 1-2 puffs into the lungs every 6 (six) hours as needed for wheezing or shortness of breath.  Marland Kitchen amitriptyline (ELAVIL) 10 MG tablet Take 10 mg by mouth at bedtime.  . Ascorbic Acid (VITAMIN C) 100 MG tablet Take 1 tablet by mouth daily.  . chlorpheniramine (EQ CHLORTABS) 4 MG tablet Take 4 mg by mouth at bedtime as needed for allergies.  . cyanocobalamin (,VITAMIN B-12,) 1000 MCG/ML injection Inject 1,000 mcg into the muscle every 30 (thirty) days.  Marland Kitchen escitalopram (LEXAPRO) 10 MG tablet Take 10 mg by mouth daily.   . hydrOXYzine (ATARAX/VISTARIL) 25 MG tablet Take 25 mg by mouth as needed.  . Memantine HCl-Donepezil HCl (NAMZARIC) 28-10 MG CP24 Take 1 capsule by mouth daily.  . Omega-3  Fatty Acids (FISH OIL) 1200 MG CAPS Take 1,200 mg by mouth daily.  . QUEtiapine (SEROQUEL) 50 MG tablet Take 50 mg by mouth at bedtime.  . tamsulosin (FLOMAX) 0.4 MG CAPS capsule Take 0.4 mg by mouth daily.     Allergies: Allergies as of 09/28/2019 - Review Complete 09/28/2019  Allergen Reaction Noted  . Tape Other (See Comments) 07/18/2019  . Penicillins Other (See Comments) 04/09/2009   Past Medical History:  Diagnosis Date  . Anxiety state, unspecified   . Arthropathy, unspecified, site unspecified   . Asthma   . BPH (benign prostatic hyperplasia)   . CAD (coronary artery disease)    a. 8/20105 CABG x 4 (LIMA->LAD, RIMA->RCA, VG->Diag, VG->OM); b. 11/2008 Cath: patent VG's with atretic LIMA/RIMA. RCA 50; c. 12/2011 MV: EF 62%, no ischemia.  Marland Kitchen COPD (chronic obstructive pulmonary disease) ([place])   . Diverticulosis   . Family history of other neurological diseases   . Gallstones   . Hiatal hernia   . Hyperlipidemia   . Hypertensive heart disease    a. 07/2005 Echo: EF nl, mildly dil LA/RA, mild TR/AI.  Marland Kitchen Memory loss   . Presyncope and Syncope    a. Syncopal spell @ home 01/2016.  . Schatzki's ring   . Tubular adenoma of colon     Family History:  Family  History  Problem Relation Age of Onset  . Coronary artery disease Father        bypass x4  . Dementia Father   . Diabetes Father   . Stroke Father   . Dementia Mother   . Asthma Mother   . Colon cancer Mother   . Arthritis Mother   . Heart attack Mother   . Cancer Daughter   . Arthritis Sister   . Parkinson's disease Maternal Uncle   . Stroke Maternal Grandmother   . Diabetes Maternal Grandfather   . Arthritis Maternal Grandfather   . Cancer Paternal Grandfather   . Parkinson's disease Brother   . Arthritis Brother   . Asthma Brother      Social History: lives at home with his wife who takes care of him; former every day drinks, approx 3 beers per day, stopped 4 years ago   Review of Systems: A complete ROS  was negative except as per HPI.   Physical Exam: Blood pressure 111/68, pulse (!) 57, temperature 98.7 F (37.1 C), temperature source Oral, resp. rate 16, SpO2 99 %.  Physical Exam Constitutional:      General: He is not in acute distress. HENT:     Mouth/Throat:     Mouth: Mucous membranes are moist.     Pharynx: No posterior oropharyngeal erythema.  Eyes:     General: No scleral icterus.    Conjunctiva/sclera: Conjunctivae normal.  Cardiovascular:     Rate and Rhythm: Normal rate and regular rhythm.  Pulmonary:     Effort: No respiratory distress.     Breath sounds: Normal breath sounds. No wheezing or rhonchi.  Abdominal:     General: Bowel sounds are normal.     Palpations: Abdomen is soft.     Tenderness: There is no abdominal tenderness.  Skin:    General: Skin is warm and dry.  Neurological:     General: No focal deficit present.     Mental Status: He is alert and oriented to person, place, and time.  Psychiatric:        Behavior: Behavior normal.        Thought Content: Thought content normal.     Assessment & Plan by Problem: Active Problems:   GI bleed  68 y.o. male with PMHx CAD s/p CABG x4, dementia, colonoscopy yesterday, who presents with reported bright red blood per rectum.   # Lower GI bleed:  Patients GI bleed began after colonoscopy and EGD yesterday. BMs already slowing down. May be secondary to bleeding from polypectomy. Hgb 12, hemodynamically stable.  -monitoring blood counts overnight.  -Will hold off on GI consult for now.   # Asthma - continue Dulera - prn albuterol  #Depression - continue lexapro  # Dementia - nortriptyline - continue donezpezil   - Air cabin crew  # BPH - continue alfuzosin  Dispo: Admit patient to Observation with expected length of stay less than 2 midnights.  Signed: Tamsen Snider, MD PGY1  [phone]

## 2019-09-28 NOTE — ED Provider Notes (Signed)
[facility]ER[facility] DEPARTMENT Provider Note   CSN: [id number] Arrival date & time: 09/28/19  0133     History   Chief Complaint Chief Complaint  Patient presents with  . Bloody Stools after Colonoscopy    HPI IBIN CIPRES is a 68 y.o. male with PMH significant for dementia, CAD s/p CABG, diverticulosis, asthma, anxiety, BPH who presents with multiple episodes of frank bloody bowel movements after colonoscopy and EGD yesterday. Prior to yesterday patient had normal BMs. Wife notes bloody BMs started about a few hours after colonoscopy. Patient denies N/V, abdominal pain, SOB, chest pain. Wife denies anticoagulation, has ASA 81mg  on medication list but wife reports he has not been on this in years. Per colonoscopy/EGD reports, patient had 5 polyps removed with cold snare and schatzki ring which was dilated, both without significant bleeding.     Past Medical History:  Diagnosis Date  . Anxiety state, unspecified   . Arthropathy, unspecified, site unspecified   . Asthma   . BPH (benign prostatic hyperplasia)   . CAD (coronary artery disease)    a. 8/20105 CABG x 4 (LIMA->LAD, RIMA->RCA, VG->Diag, VG->OM); b. 11/2008 Cath: patent VG's with atretic LIMA/RIMA. RCA 50; c. 12/2011 MV: EF 62%, no ischemia.  Marland Kitchen COPD (chronic obstructive pulmonary disease) ([place])   . Diverticulosis   . Family history of other neurological diseases   . Gallstones   . Hiatal hernia   . Hyperlipidemia   . Hypertensive heart disease    a. 07/2005 Echo: EF nl, mildly dil LA/RA, mild TR/AI.  Marland Kitchen Memory loss   . Presyncope and Syncope    a. Syncopal spell @ home 01/2016.  . Schatzki's ring   . Tubular adenoma of colon     Patient Active Problem List   Diagnosis Date Noted  . CAD (coronary artery disease)   . Hyperlipidemia   . Presyncope and Syncope   . Hypertensive heart disease   . ASTHMA 04/30/2009  . SHORTNESS OF BREATH (SOB) 04/09/2009  . COUGH 04/09/2009  . HYPERTENSION  04/08/2009  . HYPERCHOLESTEROLEMIA 02/02/2008  . ANXIETY DISORDER 02/02/2008  . CORONARY ARTERY DISEASE 02/02/2008  . ASTHMATIC BRONCHITIS, ACUTE 02/02/2008  . HEADACHE, CHRONIC 02/02/2008  . DIVERTICULITIS, COLON 09/07/2007    Past Surgical History:  Procedure Laterality Date  . ANGIOPLASTY    . CARDIAC CATHETERIZATION  08/22/2004  . CARDIAC CATHETERIZATION  12/17/2008  . COLONOSCOPY    . CORONARY ARTERY BYPASS GRAFT  2008   x4  . CORONARY ARTERY BYPASS GRAFT  2005   x3  . ROTATOR CUFF REPAIR Bilateral   . ROTATOR CUFF REPAIR  [id number]  . ROTATOR CUFF REPAIR  01/2003      Home Medications    Prior to Admission medications   Medication Sig Start Date End Date Taking? Authorizing Provider  acetaminophen (TYLENOL) 500 MG tablet Take 1,000 mg by mouth every 8 (eight) hours as needed for mild pain or headache.    [provider]  albuterol (PROVENTIL HFA;VENTOLIN HFA) 108 (90 BASE) MCG/ACT inhaler Inhale 1-2 puffs into the lungs every 6 (six) hours as needed for wheezing or shortness of breath.    [provider]  alfuzosin (UROXATRAL) 10 MG 24 hr tablet Take 10 mg by mouth daily.  09/08/13   [provider]  aspirin EC 81 MG tablet Take 81 mg by mouth daily.    [provider]  chlorpheniramine (EQ CHLORTABS) 4 MG tablet Take 4 mg by mouth at bedtime  as needed for allergies.    [provider]  cyclobenzaprine (FLEXERIL) 10 MG tablet Take 10 mg by mouth 3 (three) times daily as needed for muscle spasms.  04/11/13   [provider]  escitalopram (LEXAPRO) 10 MG tablet Take 10 mg by mouth daily.  05/10/13   [provider]  Fluticasone-Salmeterol (ADVAIR DISKUS) 100-50 MCG/DOSE AEPB Inhale 1 puff into the lungs 2 (two) times daily as needed (for shortness of breath).     [provider]  hydrOXYzine (ATARAX/VISTARIL) 25 MG tablet Take 25 mg by mouth as needed. 04/30/15   [provider]  isosorbide  mononitrate (IMDUR) 60 MG 24 hr tablet Take 30 mg by mouth daily.    [provider]  lovastatin (MEVACOR) 20 MG tablet Take 1 tablet (20 mg total) by mouth as directed. 04/23/16   Barrett, Evelene Croon, PA-C  niacin (NIASPAN) 1000 MG CR tablet Take 1,000 mg by mouth at bedtime.  04/26/13   [provider]  Omega-3 Fatty Acids (FISH OIL) 1200 MG CAPS Take 1,200 mg by mouth daily.    [provider]    Family History Family History  Problem Relation Age of Onset  . Coronary artery disease Father        bypass x4  . Dementia Father   . Diabetes Father   . Stroke Father   . Dementia Mother   . Asthma Mother   . Colon cancer Mother   . Arthritis Mother   . Heart attack Mother   . Cancer Daughter   . Arthritis Sister   . Parkinson's disease Maternal Uncle   . Stroke Maternal Grandmother   . Diabetes Maternal Grandfather   . Arthritis Maternal Grandfather   . Cancer Paternal Grandfather   . Parkinson's disease Brother   . Arthritis Brother   . Asthma Brother     Social History Social History   Tobacco Use  . Smoking status: Former Smoker    Years: 30.00    Quit date: 06/28/1983    Years since quitting: 36.2  . Smokeless tobacco: Never Used  Substance Use Topics  . Alcohol use: Yes    Alcohol/week: 1.0 standard drinks    Types: 1 Standard drinks or equivalent per week    Comment: occ  . Drug use: No     Allergies   Penicillins   Review of Systems Review of Systems - per HPI   Physical Exam Updated Vital Signs BP 130/72   Pulse (!) 58   Temp 98.7 F (37.1 C) (Oral)   Resp 16   SpO2 100%   Physical Exam Constitutional:      General: He is not in acute distress.    Appearance: Normal appearance. He is not ill-appearing or diaphoretic.  Cardiovascular:     Rate and Rhythm: Normal rate and regular rhythm.     Heart sounds: Normal heart sounds. No murmur.  Pulmonary:     Effort: Pulmonary effort is normal. No respiratory distress.      Breath sounds: Normal breath sounds. No wheezing, rhonchi or rales.  Abdominal:     General: Bowel sounds are normal. There is no distension.     Palpations: Abdomen is soft.     Tenderness: There is no abdominal tenderness. There is no guarding or rebound.  Genitourinary:    Rectum: Normal.  Skin:    General: Skin is warm and dry.  Neurological:     Mental Status: He is alert. Mental status is  at baseline.    ED Treatments / Results  Labs (all labs ordered are listed, but only abnormal results are displayed) Labs Reviewed  CBC WITH DIFFERENTIAL/PLATELET - Abnormal; Notable for the following components:      Result Value   WBC 10.8 (*)    All other components within normal limits  COMPREHENSIVE METABOLIC PANEL - Abnormal; Notable for the following components:   Glucose, Bld 119 (*)    BUN 7 (*)    Calcium 8.8 (*)    Total Protein 5.8 (*)    Albumin 3.3 (*)    All other components within normal limits  CBC - Abnormal; Notable for the following components:   RBC 4.12 (*)    Hemoglobin 12.6 (*)    HCT 38.2 (*)    All other components within normal limits  SARS CORONAVIRUS 2 (TAT 6-24 HRS)  PROTIME-INR  SAMPLE TO BLOOD BANK    EKG None  Radiology No results found.  Procedures Procedures (including critical care time)  Medications Ordered in ED Medications - No data to display   Initial Impression / Assessment and Plan / ED Course  I have reviewed the triage vital signs and the nursing notes.  Pertinent labs & imaging results that were available during my care of the patient were reviewed by me and considered in my medical decision making (see chart for details).   68yo M w/ h/o CAD s/p CABG, diverticulosis, HLD, dementia presents with multiple episodes of bloody bowel movements after colonoscopy/EGD yesterday. Per report had 5 polyps removed and schatzki ring dilated without notable blood loss or other complications. On exam, patient's mental status at baseline and  hemodynamically stable. Hb at initially at baseline but with full point drop on recheck with repeated bloody BMs in ED. Spoke with [facility] who recommended admitting for observation and serial Hb, may end up needing repeat colonoscopy if continues to have bleeding. Spoke to IM residency who will admit as unassigned.    Final Clinical Impressions(s) / ED Diagnoses   Final diagnoses:  Gastrointestinal hemorrhage, unspecified gastrointestinal hemorrhage type    ED Discharge Orders    None       Rory Percy, DO 09/28/19 1247    Carmin Muskrat, MD 09/28/19 1332

## 2019-09-28 NOTE — ED Notes (Signed)
Pt here for bleeding after a colonoscopy yesterday. Wife reports there was a copious amount of bleeding but no stool.

## 2019-09-28 NOTE — ED Notes (Signed)
Attempted to call report.  Nurse to call back once info reviewed

## 2019-09-28 NOTE — ED Notes (Signed)
Pt and his wife called me over to show me that he had a bloody bright red bowel movement in waiting room bathroom.

## 2019-09-28 NOTE — ED Notes (Signed)
Called by [facility] Fellow about this patient. Frederich Cha, MD [phone]. Hematochezia s/p colonoscopy. If needs admission, can be sent to [facility].

## 2019-09-29 ENCOUNTER — Encounter (HOSPITAL_COMMUNITY): Payer: Self-pay | Admitting: *Deleted

## 2019-09-29 DIAGNOSIS — Z8261 Family history of arthritis: Secondary | ICD-10-CM | POA: Diagnosis not present

## 2019-09-29 DIAGNOSIS — Z880 Allergy status to penicillin: Secondary | ICD-10-CM | POA: Diagnosis not present

## 2019-09-29 DIAGNOSIS — Z79899 Other long term (current) drug therapy: Secondary | ICD-10-CM

## 2019-09-29 DIAGNOSIS — M129 Arthropathy, unspecified: Secondary | ICD-10-CM | POA: Diagnosis present

## 2019-09-29 DIAGNOSIS — Z91048 Other nonmedicinal substance allergy status: Secondary | ICD-10-CM | POA: Diagnosis not present

## 2019-09-29 DIAGNOSIS — N400 Enlarged prostate without lower urinary tract symptoms: Secondary | ICD-10-CM | POA: Diagnosis present

## 2019-09-29 DIAGNOSIS — Z951 Presence of aortocoronary bypass graft: Secondary | ICD-10-CM | POA: Diagnosis not present

## 2019-09-29 DIAGNOSIS — I2510 Atherosclerotic heart disease of native coronary artery without angina pectoris: Secondary | ICD-10-CM | POA: Diagnosis present

## 2019-09-29 DIAGNOSIS — Z20828 Contact with and (suspected) exposure to other viral communicable diseases: Secondary | ICD-10-CM | POA: Diagnosis present

## 2019-09-29 DIAGNOSIS — Y838 Other surgical procedures as the cause of abnormal reaction of the patient, or of later complication, without mention of misadventure at the time of the procedure: Secondary | ICD-10-CM | POA: Diagnosis present

## 2019-09-29 DIAGNOSIS — K922 Gastrointestinal hemorrhage, unspecified: Secondary | ICD-10-CM

## 2019-09-29 DIAGNOSIS — J45909 Unspecified asthma, uncomplicated: Secondary | ICD-10-CM

## 2019-09-29 DIAGNOSIS — Z8249 Family history of ischemic heart disease and other diseases of the circulatory system: Secondary | ICD-10-CM | POA: Diagnosis not present

## 2019-09-29 DIAGNOSIS — E785 Hyperlipidemia, unspecified: Secondary | ICD-10-CM | POA: Diagnosis present

## 2019-09-29 DIAGNOSIS — F411 Generalized anxiety disorder: Secondary | ICD-10-CM | POA: Diagnosis present

## 2019-09-29 DIAGNOSIS — Z87891 Personal history of nicotine dependence: Secondary | ICD-10-CM | POA: Diagnosis not present

## 2019-09-29 DIAGNOSIS — Z7951 Long term (current) use of inhaled steroids: Secondary | ICD-10-CM

## 2019-09-29 DIAGNOSIS — F0390 Unspecified dementia without behavioral disturbance: Secondary | ICD-10-CM

## 2019-09-29 DIAGNOSIS — K91840 Postprocedural hemorrhage and hematoma of a digestive system organ or structure following a digestive system procedure: Secondary | ICD-10-CM | POA: Diagnosis present

## 2019-09-29 DIAGNOSIS — K5790 Diverticulosis of intestine, part unspecified, without perforation or abscess without bleeding: Secondary | ICD-10-CM | POA: Diagnosis present

## 2019-09-29 DIAGNOSIS — Z825 Family history of asthma and other chronic lower respiratory diseases: Secondary | ICD-10-CM | POA: Diagnosis not present

## 2019-09-29 DIAGNOSIS — F329 Major depressive disorder, single episode, unspecified: Secondary | ICD-10-CM

## 2019-09-29 DIAGNOSIS — I119 Hypertensive heart disease without heart failure: Secondary | ICD-10-CM | POA: Diagnosis present

## 2019-09-29 DIAGNOSIS — J449 Chronic obstructive pulmonary disease, unspecified: Secondary | ICD-10-CM | POA: Diagnosis present

## 2019-09-29 DIAGNOSIS — Z7982 Long term (current) use of aspirin: Secondary | ICD-10-CM | POA: Diagnosis not present

## 2019-09-29 LAB — CBC
HCT: 33.2 % — ABNORMAL LOW (ref 39.0–52.0)
HCT: 36.2 % — ABNORMAL LOW (ref 39.0–52.0)
Hemoglobin: 11.1 g/dL — ABNORMAL LOW (ref 13.0–17.0)
Hemoglobin: 12.2 g/dL — ABNORMAL LOW (ref 13.0–17.0)
MCH: 30.5 pg (ref 26.0–34.0)
MCH: 31 pg (ref 26.0–34.0)
MCHC: 33.4 g/dL (ref 30.0–36.0)
MCHC: 33.7 g/dL (ref 30.0–36.0)
MCV: 91.2 fL (ref 80.0–100.0)
MCV: 92.1 fL (ref 80.0–100.0)
Platelets: 177 10*3/uL (ref 150–400)
Platelets: 190 10*3/uL (ref 150–400)
RBC: 3.64 MIL/uL — ABNORMAL LOW (ref 4.22–5.81)
RBC: 3.93 MIL/uL — ABNORMAL LOW (ref 4.22–5.81)
RDW: 13.2 % (ref 11.5–15.5)
RDW: 13.2 % (ref 11.5–15.5)
WBC: 6.2 10*3/uL (ref 4.0–10.5)
WBC: 5.5 10*3/uL (ref 4.0–10.5)
nRBC: 0 % (ref 0.0–0.2)
nRBC: 0 % (ref 0.0–0.2)

## 2019-09-29 LAB — COMPREHENSIVE METABOLIC PANEL
ALT: 12 U/L (ref 0–44)
AST: 14 U/L — ABNORMAL LOW (ref 15–41)
Albumin: 2.9 g/dL — ABNORMAL LOW (ref 3.5–5.0)
Alkaline Phosphatase: 48 U/L (ref 38–126)
Anion gap: 9 (ref 5–15)
BUN: 6 mg/dL — ABNORMAL LOW (ref 8–23)
CO2: 23 mmol/L (ref 22–32)
Calcium: 8.4 mg/dL — ABNORMAL LOW (ref 8.9–10.3)
Chloride: 104 mmol/L (ref 98–111)
Creatinine, Ser: 1 mg/dL (ref 0.61–1.24)
GFR calc Af Amer: >60 mL/min (ref >60)
GFR calc non Af Amer: >60 mL/min (ref >60)
Glucose, Bld: 102 mg/dL — ABNORMAL HIGH (ref 70–99)
Potassium: 3.4 mmol/L — ABNORMAL LOW (ref 3.5–5.1)
Sodium: 136 mmol/L (ref 135–145)
Total Bilirubin: 1.3 mg/dL — ABNORMAL HIGH (ref 0.3–1.2)
Total Protein: 5.3 g/dL — ABNORMAL LOW (ref 6.5–8.1)

## 2019-09-29 LAB — PROTIME-INR
INR: 1.1 (ref 0.8–1.2)
Prothrombin Time: 14 seconds (ref 11.4–15.2)

## 2019-09-29 MED ORDER — METOCLOPRAMIDE HCL 5 MG/ML IJ SOLN
10.0000 mg | Freq: Once | INTRAMUSCULAR | Status: AC
  Administered 2019-09-29: 18:00:00 10 mg via INTRAVENOUS
  Filled 2019-09-29: qty 2

## 2019-09-29 MED ORDER — PEG-KCL-NACL-NASULF-NA ASC-C 100 G PO SOLR: 1.0000 | Freq: Once | ORAL | Status: DC

## 2019-09-29 MED ORDER — PEG-KCL-NACL-NASULF-NA ASC-C 100 G PO SOLR
0.5000 | Freq: Once | ORAL | Status: AC
  Administered 2019-09-30: 100 g via ORAL

## 2019-09-29 MED ORDER — METOCLOPRAMIDE HCL 5 MG/ML IJ SOLN
10.0000 mg | Freq: Once | INTRAMUSCULAR | Status: AC
  Administered 2019-09-30: 10 mg via INTRAVENOUS
  Filled 2019-09-29: qty 2

## 2019-09-29 MED ORDER — BISACODYL 5 MG PO TBEC
20.0000 mg | DELAYED_RELEASE_TABLET | Freq: Once | ORAL | Status: AC
  Administered 2019-09-29: 20 mg via ORAL
  Filled 2019-09-29: qty 4

## 2019-09-29 MED ORDER — POTASSIUM CHLORIDE CRYS ER 20 MEQ PO TBCR
30.0000 meq | EXTENDED_RELEASE_TABLET | Freq: Two times a day (BID) | ORAL | Status: DC
  Administered 2019-09-29 – 2019-09-30 (×3): 30 meq via ORAL
  Filled 2019-09-29 (×3): qty 1

## 2019-09-29 MED ORDER — ALBUTEROL SULFATE (2.5 MG/3ML) 0.083% IN NEBU: 2.5000 mg | INHALATION_SOLUTION | Freq: Four times a day (QID) | RESPIRATORY_TRACT | Status: DC | PRN

## 2019-09-29 MED ORDER — PEG-KCL-NACL-NASULF-NA ASC-C 100 G PO SOLR
0.5000 | Freq: Once | ORAL | Status: AC
  Administered 2019-09-29: 100 g via ORAL
  Filled 2019-09-29: qty 1

## 2019-09-29 NOTE — Care Management Obs Status (Signed)
[facility] NOTIFICATION   Patient Details  Name: AHMOD ANELLI MRN: [id number] Date of Birth: Apr 10, 1951   Medicare Observation Status Notification Given:  Yes  CM provided the form to the patients spouse, Severus Aguiar.  Midge Minium RN, BSN, NCM-BC, ACM-RN [phone] 09/29/2019, 3:52 PM

## 2019-09-29 NOTE — Plan of Care (Signed)
  Problem: Clinical Measurements: Goal: Ability to maintain clinical measurements within normal limits will improve Outcome: Progressing   Problem: Clinical Measurements: Goal: Diagnostic test results will improve Outcome: Progressing   Problem: Elimination: Goal: Will not experience complications related to bowel motility Outcome: Progressing

## 2019-09-29 NOTE — Consult Note (Signed)
[facility] Consult: 3:40 PM 09/29/2019  LOS: 0 days    Referring Provider: Dr Heber [place]  Primary Care Physician:  Einar Pheasant, DO Primary Gastroenterologist: [facility].    Reason for Consultation: Hematochezia in patient who had colonoscopy with polypectomies 4 days ago.   HPI: Jose Leonard is a 68 y.o. male.  Previous medical history listed below. 05/2015 EGD for dysphagia, early satiety, loose stools Schatzki's ring balloon dilated.  3 cm hiatal hernia.  Otherwise normal study to second duodenum.  Biopsies of the duodenum: Benign small bowel mucosa no villous blunting, inflammation, dysplasia, malignancy. 05/2015 colonoscopy for evaluation of chronic diarrhea and family history colon cancer.  Several polyps removed.  Random colon biopsies benign, no evidence of lymphocytic or collagenous colitis, and IBD or malignancy.  Polyp path: tubular adenomas without HGD.   09/27/19 Colonoscopy for polyp surveillance and c/o diarrhea: cold snare removal of 5 polyps, larger 8 mm polyp at appendiceal orifice, smaller 3 to 6 mm polyps at descending and rectum.  Dr Serita Butcher MD at [facility].   No path report yet in Care Everywhere.   EGD the same day with dilation of Schatzki ring.  Pt c/o dysphagia.  Wednesday evening, after the colonoscopy, he had a bowel movement wiped and saw blood.  For the next 24 hours he had a total of about 15 episodes of painless hematochezia.  Did not feel dizzy, nauseated, no abdominal pain.  His wife brought him to the emergency room yesterday and he was admitted.  The spacing between episodes of hematochezia has lengthened.  His last episode was about 3 PM today.  Hgb 12.5 in 01/2019.  Since arrival Hgb has gone from 13.4 >> 12.2.  Platelets, WBCs, MCV, INR normal.   BUN is not elevated.  Covid 19 negative.  No alcohol or history of heavy alcohol use.  No aspirin or NSAIDs.  No anorexia, no significant weight fluctuation.   His mother had colon cancer.  Past Medical History:  Diagnosis Date  . Anxiety state, unspecified   . Arthropathy, unspecified, site unspecified   . Asthma   . BPH (benign prostatic hyperplasia)   . CAD (coronary artery disease)    a. 8/20105 CABG x 4 (LIMA->LAD, RIMA->RCA, VG->Diag, VG->OM); b. 11/2008 Cath: patent VG's with atretic LIMA/RIMA. RCA 50; c. 12/2011 MV: EF 62%, no ischemia.  Marland Kitchen COPD (chronic obstructive pulmonary disease) ([place])   . Diverticulosis   . Family history of other neurological diseases   . Gallstones   . GI bleed 09/2019  . Hiatal hernia   . Hyperlipidemia   . Hypertensive heart disease    a. 07/2005 Echo: EF nl, mildly dil LA/RA, mild TR/AI.  Marland Kitchen Memory loss   . Presyncope and Syncope    a. Syncopal spell @ home 01/2016.  . Schatzki's ring   . Tubular adenoma of colon     Past Surgical History:  Procedure Laterality Date  . ANGIOPLASTY    . CARDIAC CATHETERIZATION  08/22/2004  . CARDIAC CATHETERIZATION  12/17/2008  .  COLONOSCOPY    . CORONARY ARTERY BYPASS GRAFT  2008   x4  . CORONARY ARTERY BYPASS GRAFT  2005   x3  . ROTATOR CUFF REPAIR Bilateral   . ROTATOR CUFF REPAIR  [id number]  . ROTATOR CUFF REPAIR  01/2003    Prior to Admission medications   Medication Sig Start Date End Date Taking? Authorizing Provider  acetaminophen (TYLENOL) 500 MG tablet Take 1,000 mg by mouth every 8 (eight) hours as needed for mild pain or headache.   Yes [provider]  albuterol (PROVENTIL HFA;VENTOLIN HFA) 108 (90 BASE) MCG/ACT inhaler Inhale 1-2 puffs into the lungs every 6 (six) hours as needed for wheezing or shortness of breath.   Yes [provider]  amitriptyline (ELAVIL) 10 MG tablet Take 10 mg by mouth at bedtime. 05/30/19  Yes [provider]  Ascorbic Acid (VITAMIN C) 100 MG  tablet Take 1 tablet by mouth daily.   Yes [provider]  chlorpheniramine (EQ CHLORTABS) 4 MG tablet Take 4 mg by mouth at bedtime as needed for allergies.   Yes [provider]  cyanocobalamin (,VITAMIN B-12,) 1000 MCG/ML injection Inject 1,000 mcg into the muscle every 30 (thirty) days.   Yes [provider]  escitalopram (LEXAPRO) 10 MG tablet Take 10 mg by mouth daily.  05/10/13  Yes [provider]  hydrOXYzine (ATARAX/VISTARIL) 25 MG tablet Take 25 mg by mouth as needed. 04/30/15  Yes [provider]  Memantine HCl-Donepezil HCl (NAMZARIC) 28-10 MG CP24 Take 1 capsule by mouth daily. 05/23/19  Yes [provider]  Omega-3 Fatty Acids (FISH OIL) 1200 MG CAPS Take 1,200 mg by mouth daily.   Yes [provider]  QUEtiapine (SEROQUEL) 50 MG tablet Take 50 mg by mouth at bedtime.   Yes [provider]  tamsulosin (FLOMAX) 0.4 MG CAPS capsule Take 0.4 mg by mouth daily. 09/08/19  Yes [provider]  amitriptyline (ELAVIL) 25 MG tablet Take 25 mg by mouth at bedtime. 09/08/19   [provider]  nortriptyline (PAMELOR) 10 MG capsule Take 10 mg by mouth at bedtime. 09/27/19   [provider]    Scheduled Meds: . memantine  28 mg Oral Q2000   And  . donepezil  10 mg Oral Q2000  . escitalopram  10 mg Oral Daily  . mometasone-formoterol  2 puff Inhalation BID  . nortriptyline  10 mg Oral QHS  . potassium chloride  30 mEq Oral BID  . tamsulosin  0.4 mg Oral Daily   Infusions:  PRN Meds: albuterol   Allergies as of 09/28/2019 - Review Complete 09/28/2019  Allergen Reaction Noted  . Tape Other (See Comments) 07/18/2019  . Penicillins Other (See Comments) 04/09/2009    Family History  Problem Relation Age of Onset  . Coronary artery disease Father        bypass x4  . Dementia Father   . Diabetes Father   . Stroke Father   . Dementia Mother   . Asthma Mother   . Colon cancer Mother   .  Arthritis Mother   . Heart attack Mother   . Cancer Daughter   . Arthritis Sister   . Parkinson's disease Maternal Uncle   . Stroke Maternal Grandmother   . Diabetes Maternal Grandfather   . Arthritis Maternal Grandfather   . Cancer Paternal Grandfather   . Parkinson's disease Brother   . Arthritis Brother   . Asthma Brother     Social History  Socioeconomic History  . Marital status: Married    Spouse name: Not on file  . Number of children: 2  . Years of education: Not on file  . Highest education level: Not on file  Occupational History  . Occupation: disabled  Social Needs  . Financial resource strain: Not on file  . Food insecurity    Worry: Not on file    Inability: Not on file  . Transportation needs    Medical: Not on file    Non-medical: Not on file  Tobacco Use  . Smoking status: Former Smoker    Years: 30.00    Quit date: 06/28/1983    Years since quitting: 36.2  . Smokeless tobacco: Never Used  Substance and Sexual Activity  . Alcohol use: Yes    Alcohol/week: 1.0 standard drinks    Types: 1 Standard drinks or equivalent per week    Comment: occ  . Drug use: No  . Sexual activity: Not on file  Lifestyle  . Physical activity    Days per week: Not on file    Minutes per session: Not on file  . Stress: Not on file  Relationships  . Social Herbalist on phone: Not on file    Gets together: Not on file    Attends religious service: Not on file    Active member of club or organization: Not on file    Attends meetings of clubs or organizations: Not on file    Relationship status: Not on file  . Intimate partner violence    Fear of current or ex partner: Not on file    Emotionally abused: Not on file    Physically abused: Not on file    Forced sexual activity: Not on file  Other Topics Concern  . Not on file  Social History Narrative  . Not on file    REVIEW OF SYSTEMS: Constitutional: No weakness or fatigue ENT:  No nose  bleeds Pulm: Currently no trouble breathing but in recent weeks his wife has noted a lot of wheezing.  No cough currently. CV:  No palpitations, no LE edema.  No chest pain. GU:  No hematuria, no frequency.   GI: See HPI. Heme: Other than the rectal bleeding, no unusual or excessive bleeding or bruising. Transfusions: None. Neuro:  No headaches, no peripheral tingling or numbness.  Syncope.  No seizures. Derm:  No itching, no rash or sores.  Endocrine:  No sweats or chills.  No polyuria or dysuria Immunization: Reviewed.  He is current on his flu shot. Travel:  None beyond local counties in last few months.    PHYSICAL EXAM: Vital signs in last 24 hours: Vitals:   09/29/19 1418 09/29/19 1528  BP: 121/66   Pulse: 90   Resp: 16   Temp: 98.2 F (36.8 C)   SpO2: 99% 98%   Wt Readings from Last 3 Encounters:  09/29/19 82.8 kg  04/23/16 82.8 kg  03/12/16 74.8 kg    General: Pleasant, cooperative.  Looks well.  Comfortable. Head: Facial asymmetry or swelling.  No signs of head trauma. Eyes: Conjunctival pallor.  No scleral icterus.  EOMI Ears: No hearing deficit Nose: No congestion or discharge Mouth: Edentulous.  Oral mucosa moist, pink, clear.  Tongue midline. Neck: No JVD, no masses, no thyromegaly. Lungs: Overall reduced breath sounds globally but clear bilaterally.  No labored breathing, no cough. Heart: RRR.  No MRG.  S1, S2 present Abdomen: Soft.  Not tender  or distended.  Active bowel sounds.  No HSM, masses, bruits, hernias.   Rectal: Not repeated Musc/Skeltl: no joint swelling, redness, deformity. Extremities: No CCE Neurologic: Alert.  Appropriate.  Moves all 4 limbs.  Oriented x3.  No tremors or gross deficits Skin: No rash, no sores, no significant purpura or bruising. Tattoos: None observed Nodes: No cervical adenopathy. Psych: Cooperative, pleasant, calm.  Intake/Output from previous day: 11/05 0701 - 11/06 0700 In: 200 [P.O.:200] Out: -  Intake/Output  this shift: Total I/O In: 240 [P.O.:240] Out: -   LAB RESULTS: Recent Labs    09/28/19 0907 09/29/19 0150 09/29/19 1426  WBC 9.0 6.2 5.5  HGB 12.6* 11.1* 12.2*  HCT 38.2* 33.2* 36.2*  PLT 217 177 190   BMET Lab Results  Component Value Date   NA 136 09/29/2019   NA 137 09/28/2019   NA 137 03/12/2016   K 3.4 (L) 09/29/2019   K 3.6 09/28/2019   K 5.1 03/12/2016   CL 104 09/29/2019   CL 103 09/28/2019   CL 102 03/12/2016   CO2 23 09/29/2019   CO2 24 09/28/2019   CO2 26 03/12/2016   GLUCOSE 102 (H) 09/29/2019   GLUCOSE 119 (H) 09/28/2019   GLUCOSE 103 (H) 03/12/2016   BUN 6 (L) 09/29/2019   BUN 7 (L) 09/28/2019   BUN 6 (L) 03/12/2016   CREATININE 1.00 09/29/2019   CREATININE 1.05 09/28/2019   CREATININE 0.91 03/12/2016   CALCIUM 8.4 (L) 09/29/2019   CALCIUM 8.8 (L) 09/28/2019   CALCIUM 9.6 03/12/2016   LFT Recent Labs    09/28/19 0152 09/29/19 0150  PROT 5.8* 5.3*  ALBUMIN 3.3* 2.9*  AST 16 14*  ALT 13 12  ALKPHOS 60 48  BILITOT 0.9 1.3*   PT/INR Lab Results  Component Value Date   INR 1.1 09/29/2019   INR 1.0 09/28/2019   INR 0.97 09/26/2010    IMPRESSION:   *     Painless hematochezia inpatient developing within a few hours of colonoscopy with multiple polypectomies.  Presumed post polypectomy bleeding.  This seems to be slowing down over the last several hours. Bleeding is not associated with significant anemia.  *    Dysphagia.  Hx Schatzki's ring, dilated in 2016 and again on 09/27/2019.     PLAN:     *    Bowel prep tonight with movie prep/Dulcolax/Reglan/clear liquids.  Schedule for colonoscopy tomorrow.  If the bleeding clears during the course of the bowel prep, it is possible we will not need to pursue the colonoscopy but we will make the final decision tomorrow.   Azucena Freed  09/29/2019, 3:40 PM Phone [phone]

## 2019-09-29 NOTE — Progress Notes (Signed)
   Subjective: No bowel movements overnight. Denies feeling lightheaded , SOB, or chest pain.   Objective:  Vital signs in last 24 hours: Vitals:   09/29/19 0424 09/29/19 0426 09/29/19 0743 09/29/19 0830  BP: 96/60 96/60 100/68   Pulse: 83 83 77   Resp: 17 17 14    Temp: 97.6 F (36.4 C) 97.6 F (36.4 C) 98.2 F (36.8 C)   TempSrc: Oral Oral Oral   SpO2:  100% 97% 94%  Weight:  82.8 kg    Height:  5\' 8"  (1.727 m)     Gen: NAD, resting in bed Cardiovascular: Regular rate and rhythm, no murmurs, rubs or gallops Pulmonary: No respiratory distress, no wheezing or rhonchi Abdominal: Normal bowel sounds, no tenderness to palpation  Assessment/Plan:  Active Problems:   GI bleed  68 year old male with past medical history of coronary artery disease status post CABG x4 ,dementia ,colonoscopy yon 11/4 who presents with bright red blood per rectum.   #Lower GI bleed Patient asymptomatic and no bowel movements overnight.  We will continue to monitor with CBC midday and have patient ambulate with pulse ox.  Patient is doing well will discharge with follow-up with his GI doctor. - CBC - Ambulate with pulse ox  # Asthma - Continue Dulera - prn albuterol  #Depression - Continue lexapro  #Dementia - continue nortriptyline, donepzil,   #BPH - continue tamsulosin   Dispo: Anticipated discharge in 0-1 days.   Tamsen Snider, MD PGY1  [phone]

## 2019-09-29 NOTE — Progress Notes (Signed)
Page bedside to evaluate wound on left buttock. Patient has a fresh scab and this is a small sore. In addition patient recently had a large bloody bowel movement. He is asymptomatic and hemodynamically stable. Will monitor patient overnight and consult GI for continued bleeding.

## 2019-09-30 ENCOUNTER — Encounter (HOSPITAL_COMMUNITY): Payer: Self-pay | Admitting: Certified Registered Nurse Anesthetist

## 2019-09-30 DIAGNOSIS — Z91048 Other nonmedicinal substance allergy status: Secondary | ICD-10-CM

## 2019-09-30 DIAGNOSIS — Z880 Allergy status to penicillin: Secondary | ICD-10-CM

## 2019-09-30 LAB — BASIC METABOLIC PANEL
Anion gap: 8 (ref 5–15)
BUN: <5 mg/dL — ABNORMAL LOW (ref 8–23)
CO2: 22 mmol/L (ref 22–32)
Calcium: 8.8 mg/dL — ABNORMAL LOW (ref 8.9–10.3)
Chloride: 107 mmol/L (ref 98–111)
Creatinine, Ser: 0.98 mg/dL (ref 0.61–1.24)
GFR calc Af Amer: >60 mL/min (ref >60)
GFR calc non Af Amer: >60 mL/min (ref >60)
Glucose, Bld: 104 mg/dL — ABNORMAL HIGH (ref 70–99)
Potassium: 4.1 mmol/L (ref 3.5–5.1)
Sodium: 137 mmol/L (ref 135–145)

## 2019-09-30 LAB — SURGICAL PCR SCREEN
MRSA, PCR: NEGATIVE
Staphylococcus aureus: NEGATIVE

## 2019-09-30 LAB — CBC
HCT: 34.3 % — ABNORMAL LOW (ref 39.0–52.0)
Hemoglobin: 11.4 g/dL — ABNORMAL LOW (ref 13.0–17.0)
MCH: 30.4 pg (ref 26.0–34.0)
MCHC: 33.2 g/dL (ref 30.0–36.0)
MCV: 91.5 fL (ref 80.0–100.0)
Platelets: 198 10*3/uL (ref 150–400)
RBC: 3.75 MIL/uL — ABNORMAL LOW (ref 4.22–5.81)
RDW: 13.3 % (ref 11.5–15.5)
WBC: 6.2 10*3/uL (ref 4.0–10.5)
nRBC: 0 % (ref 0.0–0.2)

## 2019-09-30 SURGERY — COLONOSCOPY WITH PROPOFOL
Anesthesia: Monitor Anesthesia Care

## 2019-09-30 NOTE — Progress Notes (Signed)
Jose Leonard to be discharged Home per MD order. Discussed prescriptions and follow up appointments with the patient. Prescriptions given to patient; medication list explained in detail. Patient verbalized understanding.  Skin clean, dry and intact without evidence of skin break down, no evidence of skin tears noted. IV catheter discontinued intact. Site without signs and symptoms of complications. Dressing and pressure applied. Pt denies pain at the site currently. No complaints noted.  Patient free of lines, drains, and wounds.   An After Visit Summary (AVS) was printed and given to the patient. Patient escorted via wheelchair, and discharged home via private auto.  [name], RN

## 2019-09-30 NOTE — Discharge Summary (Signed)
Name: MAJER BANSAL MRN: [id number] DOB: 1951/10/25 68 y.o. PCP: Einar Pheasant, DO  Date of Admission: 09/28/2019  1:38 AM Date of Discharge: 09/30/2019 Attending Physician: Lucious Groves, DO  Discharge Diagnosis: 1. GI bleed  Discharge Medications: Allergies as of 09/30/2019      Reactions   Tape Other (See Comments)   Penicillins Other (See Comments)   Did it involve swelling of the face/tongue/throat, SOB, or low BP? Unknown Did it involve sudden or severe rash/hives, skin peeling, or any reaction on the inside of your mouth or nose? Unknown Did you need to seek medical attention at a hospital or doctor's office? Unknown When did it last happen? If all above answers are "NO", may proceed with cephalosporin use. Childhood allergy      Medication List    TAKE these medications   acetaminophen 500 MG tablet Commonly known as: TYLENOL Take 1,000 mg by mouth every 8 (eight) hours as needed for mild pain or headache.   albuterol 108 (90 Base) MCG/ACT inhaler Commonly known as: VENTOLIN HFA Inhale 1-2 puffs into the lungs every 6 (six) hours as needed for wheezing or shortness of breath.   amitriptyline 10 MG tablet Commonly known as: ELAVIL Take 10 mg by mouth at bedtime.   amitriptyline 25 MG tablet Commonly known as: ELAVIL Take 25 mg by mouth at bedtime.   cyanocobalamin 1000 MCG/ML injection Commonly known as: (VITAMIN B-12) Inject 1,000 mcg into the muscle every 30 (thirty) days.   EQ Chlortabs 4 MG tablet Generic drug: chlorpheniramine Take 4 mg by mouth at bedtime as needed for allergies.   escitalopram 10 MG tablet Commonly known as: LEXAPRO Take 10 mg by mouth daily.   Fish Oil 1200 MG Caps Take 1,200 mg by mouth daily.   hydrOXYzine 25 MG tablet Commonly known as: ATARAX/VISTARIL Take 25 mg by mouth as needed.   Namzaric 28-10 MG Cp24 Generic drug: Memantine HCl-Donepezil HCl Take 1 capsule by mouth daily.   nortriptyline 10 MG  capsule Commonly known as: PAMELOR Take 10 mg by mouth at bedtime.   QUEtiapine 50 MG tablet Commonly known as: SEROQUEL Take 50 mg by mouth at bedtime.   tamsulosin 0.4 MG Caps capsule Commonly known as: FLOMAX Take 0.4 mg by mouth daily.   vitamin C 100 MG tablet Take 1 tablet by mouth daily.       Disposition and follow-up:   Mr.Akshath L Muneton was discharged from [facility] in Stable condition.  At the hospital follow up visit please address:  1.   #Lower GI bleed - Assess for continued symptoms - Obtain CBC, discharge Hgb of 11  2.  Labs / imaging needed at time of follow-up: CBC  3.  Pending labs/ test needing follow-up:   Follow-up Appointments: Follow-up Information    Einar Pheasant, DO. Schedule an appointment as soon as possible for a visit.   Specialty: Family Medicine Contact information: [address] [place] [address] [phone]           Hospital Course by problem list: 1. #Lower GI bleed Presented after reported 14 blood bowel movements at home, one day after polypectomy. Monitored with one additional bloody bowel movement during admission. At this time GI was consulted and patient started on bowel prep. Patient reported no bowel movements the next morning and decision was made not to undergo repeat colonoscopy. Likely,  post-polypectomy bleed which seems to have resolved on its own.  With stable Hgb patient was discharged to follow-up with outpatient GI.   # Asthma - Continue Dulera - prn albuterol  #Depression - Continue lexapro  #Dementia - continue nortriptyline, donepzil  #BPH - continue tamsulosin   Discharge Vitals:   BP 126/65 (BP Location: Right Arm)   Pulse 72   Temp 98.4 F (36.9 C) (Oral)   Resp 17   Ht 5\' 8"  (1.727 m)   Wt 82.8 kg   SpO2 98%   BMI 27.76 kg/m   Pertinent Labs, Studies, and Procedures:  CBC Latest Ref Rng & Units 09/30/2019 09/29/2019 09/29/2019  WBC 4.0 -  10.5 K/uL 6.2 5.5 6.2  Hemoglobin 13.0 - 17.0 g/dL 11.4(L) 12.2(L) 11.1(L)  Hematocrit 39.0 - 52.0 % 34.3(L) 36.2(L) 33.2(L)  Platelets 150 - 400 K/uL 198 190 177   BMP Latest Ref Rng & Units 09/30/2019 09/29/2019 09/28/2019  Glucose 70 - 99 mg/dL 104(H) 102(H) 119(H)  BUN 8 - 23 mg/dL <5(L) 6(L) 7(L)  Creatinine 0.61 - 1.24 mg/dL 0.98 1.00 1.05  BUN/Creat Ratio 10 - 22 - - -  Sodium 135 - 145 mmol/L 137 136 137  Potassium 3.5 - 5.1 mmol/L 4.1 3.4(L) 3.6  Chloride 98 - 111 mmol/L 107 104 103  CO2 22 - 32 mmol/L 22 23 24   Calcium 8.9 - 10.3 mg/dL 8.8(L) 8.4(L) 8.8(L)     Discharge Instructions: Discharge Instructions    Call MD for:  difficulty breathing, headache or visual disturbances   Complete by: As directed    Call MD for:  persistant dizziness or light-headedness   Complete by: As directed    Diet - low sodium heart healthy   Complete by: As directed    Discharge instructions   Complete by: As directed    Mr. Mahood, It was a pleasure taking care of you. You were admitted for a bleed in your GI tract that has fortunately resolved on its own. Please be sure to follow-up with the GI doctor that performed your colonoscopy, as well as your primary care doctor sometime next week to be sure your blood counts have remained stable.   Take care!   Increase activity slowly   Complete by: As directed       Signed:  Tamsen Snider, MD PGY1  [phone]

## 2019-09-30 NOTE — Plan of Care (Signed)
  Problem: Nutrition: Goal: Adequate nutrition will be maintained Outcome: Progressing   Problem: Coping: Goal: Level of anxiety will decrease Outcome: Progressing   Problem: Elimination: Goal: Will not experience complications related to bowel motility Outcome: Progressing Goal: Will not experience complications related to urinary retention Outcome: Progressing   Problem: Safety: Goal: Ability to remain free from injury will improve Outcome: Progressing   Problem: Skin Integrity: Goal: Risk for impaired skin integrity will decrease Outcome: Progressing   

## 2019-09-30 NOTE — Progress Notes (Signed)
Patient finished 2nd prep around 0100. Patient had one BM that had a small amount of bleeding and second BM had no bleeding.

## 2019-09-30 NOTE — Progress Notes (Signed)
   Subjective: No acute events overnight. Patient had a bloody bowel movement after initiating prep, but has not had any further bleeding since. Denies abdominal pain, light headedness, shortness of breath.   Objective:  Vital signs in last 24 hours: Vitals:   09/29/19 2021 09/30/19 0401 09/30/19 0830 09/30/19 0900  BP:  107/65  121/70  Pulse:  66 76 66  Resp:  18 16 17   Temp:    97.9 F (36.6 C)  TempSrc:    Oral  SpO2: 98% 98% 97% 96%  Weight:      Height:       General: awake, alert, pleasant gentleman lying comfortably in bed CV: RRR Pulm: normal work of breathing; lungs CTAB Abd: BS+; abdomen soft, non-tender, non-distended   Assessment/Plan:  Active Problems:   GI bleed  68 year old male with past medical history of coronary artery disease status post CABG x4 ,dementia ,colonoscopy on 11/4 who presents with bright red blood per rectum.   #Lower GI bleed - Likely post-polypectomy bleed which seems to have resolved on its own - he has remained hemodynamically stable overnight; Hgb stable at 11 - appreciate GI consulting; plan to monitor for further bleeding after completion of bowel prep this morning. If no further bleeding, he will be stable for discharge to follow-up with outpatient GI. If he has continued bleeding, they will proceed with colonoscopy this afternoon.   # Asthma - Continue Dulera - prn albuterol  #Depression - Continue lexapro  #Dementia - continue nortriptyline, donepzil  #BPH - continue tamsulosin  Dispo: Anticipated discharge in approximately 0-1 day(s).   Modena Nunnery D, DO 09/30/2019, 11:51 AM Pager: [phone]

## 2019-09-30 NOTE — Progress Notes (Signed)
     [facility] Progress Note  CC:  I've not had any more bleeding  Assessment / Plan: Suspected post-polypectomy bleed Colonoscopy 09/27/19 with cold snare polypectomy of an 1mm appendical orifice polyp and smaller 3-6 mm polyps in the descending colon and rectum GI blood loss anemia Schatzki's ring s/p dilation 09/27/19 Family history of colon cancer (mother)  Patient remains hemodynamically stable. No further bleeding overnight with bowel prep. Hemoglobin is stable.  I suspect his bleeding has stopped. Would only pursue colonoscopy with additional bleeding.  Discussed these recommendations with the teaching service this morning. He should follow-up with the performing gastroenterologist (Dr. Lynita Lombard) to review the path from his colonoscopy and surveillance recommendations.   Subjective: Feeling better today. Had some bleeding when he first started the bowel prep last night. No blood seen since that time. GI ROS is negative. Wife is not present this morning. No new complaints or concerns.   Objective:  Vital signs in last 24 hours: Temp:  [97.9 F (36.6 C)-98.4 F (36.9 C)] 97.9 F (36.6 C) (11/07 0900) Pulse Rate:  [66-101] 66 (11/07 0900) Resp:  [16-18] 17 (11/07 0900) BP: (107-136)/(65-71) 121/70 (11/07 0900) SpO2:  [96 %-99 %] 96 % (11/07 0900) Last BM Date: 09/29/19 General:   Alert, in NAD Heart:  Regular rate and rhythm; no murmurs Pulm: Clear anteriorly; no wheezing Abdomen:  Soft. Nontender. Nondistended. Normal bowel sounds. No rebound or guarding. LAD: No inguinal or umbilical LAD Extremities:  Without edema. Neurologic:  Alert and  oriented x4;  grossly normal neurologically. Psych:  Alert and cooperative. Normal mood and affect.  Intake/Output from previous day: 11/06 0701 - 11/07 0700 In: 960 [P.O.:960] Out: -  Intake/Output this shift: No intake/output data recorded.  Lab Results: Recent Labs    09/29/19 0150 09/29/19 1426 09/30/19 0355   WBC 6.2 5.5 6.2  HGB 11.1* 12.2* 11.4*  HCT 33.2* 36.2* 34.3*  PLT 177 190 198   BMET Recent Labs    09/28/19 0152 09/29/19 0150 09/30/19 0355  NA 137 136 137  K 3.6 3.4* 4.1  CL 103 104 107  CO2 24 23 22   GLUCOSE 119* 102* 104*  BUN 7* 6* <5*  CREATININE 1.05 1.00 0.98  CALCIUM 8.8* 8.4* 8.8*   LFT Recent Labs    09/29/19 0150  PROT 5.3*  ALBUMIN 2.9*  AST 14*  ALT 12  ALKPHOS 48  BILITOT 1.3*   PT/INR Recent Labs    09/28/19 0152 09/29/19 0150  LABPROT 13.2 14.0  INR 1.0 1.1     LOS: 1 day   [place]  09/30/2019, 10:52 AM

## 2019-10-01 ENCOUNTER — Telehealth: Payer: Self-pay | Admitting: Gastroenterology

## 2019-10-01 NOTE — Telephone Encounter (Signed)
I called the patient for follow-up this morning. He has not had any further bleeding since discharge yesterday. In fact, he has yet to have a bowel movement. No complaints or concerns at this time. He will follow-up with his local gastroenterologist.

## 2019-11-02 ENCOUNTER — Encounter: Payer: Self-pay | Admitting: Neurology

## 2019-11-02 ENCOUNTER — Encounter: Payer: Self-pay | Admitting: Physician Assistant

## 2019-11-02 ENCOUNTER — Ambulatory Visit (INDEPENDENT_AMBULATORY_CARE_PROVIDER_SITE_OTHER): Payer: Medicare Other | Admitting: Physician Assistant

## 2019-11-02 ENCOUNTER — Other Ambulatory Visit: Payer: Self-pay

## 2019-11-02 VITALS — BP 120/78 | HR 71 | Temp 98.4°F | Ht 68.5 in | Wt 194.6 lb

## 2019-11-02 DIAGNOSIS — F0390 Unspecified dementia without behavioral disturbance: Secondary | ICD-10-CM

## 2019-11-02 DIAGNOSIS — E785 Hyperlipidemia, unspecified: Secondary | ICD-10-CM

## 2019-11-02 DIAGNOSIS — I25810 Atherosclerosis of coronary artery bypass graft(s) without angina pectoris: Secondary | ICD-10-CM

## 2019-11-02 DIAGNOSIS — R0600 Dyspnea, unspecified: Secondary | ICD-10-CM

## 2019-11-02 DIAGNOSIS — R0609 Other forms of dyspnea: Secondary | ICD-10-CM

## 2019-11-02 DIAGNOSIS — R0683 Snoring: Secondary | ICD-10-CM

## 2019-11-02 MED ORDER — ASPIRIN EC 81 MG PO TBEC: 81.0000 mg | DELAYED_RELEASE_TABLET | Freq: Every day | ORAL | Status: AC

## 2019-11-02 MED ORDER — ATORVASTATIN CALCIUM 20 MG PO TABS: 20.0000 mg | ORAL_TABLET | Freq: Every day | ORAL | 3 refills | Status: DC

## 2019-11-02 NOTE — Patient Instructions (Addendum)
Medication Instructions:   START Lipitor 20 mg daily  START Aspirin 81 mg daily in 2 weeks-11/16/2019 (Can be brought over the counter) *If you need a refill on your cardiac medications before your next appointment, please call your pharmacy*  Lab Work: You will need to have labs (blood work) drawn today:  Fasting Lipid Panel Your physician recommends that you return for labs (blood work) in 3 months January 31, 2020 :  Fasting Lipid Panel-DO NOT EAT OR DRINK PAST MIDNIGHT. WATER OK TO DRINK  Liver Function Test  If you have labs (blood work) drawn today and your tests are completely normal, you will receive your results only by: Marland Kitchen MyChart Message (if you have MyChart) OR . A paper copy in the mail If you have any lab test that is abnormal or we need to change your treatment, we will call you to review the results.  Testing/Procedures: Your physician has requested that you have an Echocardiogram. Echocardiography is a painless test that uses sound waves to create images of your heart. It provides your doctor with information about the size and shape of your heart and how well your heart's chambers and valves are working. This procedure takes approximately one hour. There are no restrictions for this procedure.   PLEASE SCHEDULE TO BE DONE WITHIN 1 MONTH  Follow-Up: At [facility], you and your health needs are our priority.  As part of our continuing mission to provide you with exceptional heart care, we have created designated Provider Care Teams.  These Care Teams include your primary Cardiologist (physician) and Advanced Practice Providers (APPs -  Physician Assistants and Nurse Practitioners) who all work together to provide you with the care you need, when you need it.  Your next appointment:   6 month(s)  The format for your next appointment:   In Person  Provider:   Shelva Majestic, MD  Other Instructions  You have been referred to Neurology

## 2019-11-02 NOTE — Progress Notes (Addendum)
Cardiology Office Note:    Date:  11/04/2019   ID:  Jose Leonard, DOB Jul 22, 1951, MRN [id number]  PCP:  Einar Pheasant, DO  Cardiologist:  Shelva Majestic, MD  Electrophysiologist:  None   Referring MD: Sarina Ser, MD   Chief Complaint  Patient presents with  . Follow-up    seen for Dr. Claiborne Billings.     History of Present Illness:    Jose Leonard is a 68 y.o. male with a hx of CAD s/p CABG x4 in 06/2004 (LIMA-LAD, RIMA-RCA, SVG-diagonal, SVG-OM), HTN, HLD, COPD, and history of syncope in 2017.  Last cardiac catheterization was performed in January 2010 which showed patent SVG with atretic LIMA and RIMa.  Myoview in 2013 showed EF 62%, no ischemia.  His cardiology follow-up has been sporadic.  He was lost to follow-up between 2015 until 2017 when he had a syncopal episode.  Heart monitor and echocardiogram were ordered.  Echocardiogram obtained on 04/01/2016 showed EF 55 to 60%, grade 1 DD, PA peak pressure 26 mmHg.  Her monitor showed no significant arrhythmia.  He does have symptom associated with orthostatic dizziness, however symptom was improving on the last follow-up in June 2017.  He has been lost to follow-up since.  In the past 3 years, he has been treated by internal medicine service for hypogonadism and by neurology for dementia.  More recently, patient was admitted in early November 2020 with lower GI bleed.  This occurred after polypectomy, the decision was made not to repeat another colonoscopy.  Patient presents along with his wife for follow-up today.  He has not had any further GI bleeding issue.  His wife mentions that his dementia has been worsening and frustrated at the fact that she has not been able to obtain any appointments.  She requested me to refer him to a new local neurologist which I think is reasonable.  He denies any chest pain however wife has been concerned about his dyspnea.  I plan to obtain an echocardiogram.  I also recommended he starts on 81 mg aspirin  and low-dose 20 mg daily of Lipitor.  Aspirin can be started in 2 weeks in order to allow adequate time of healing after the recent lower GI bleed.  I plan to obtain a fasting lipid panel today and also repeat a fasting lipid panel and liver function test in 3 months.  He can follow-up with Dr. Claiborne Billings in 6 months.  His wife showed me a video of him making whistling noise during sleep.  I suspect this is upper airway restriction.  I suggested her to check with his PCP to see if he needs to be seen by ENT.  Patient also have snoring issue and need sleep study.    Past Medical History:  Diagnosis Date  . Anxiety state, unspecified   . Arthropathy, unspecified, site unspecified   . Asthma   . BPH (benign prostatic hyperplasia)   . CAD (coronary artery disease)    a. 8/20105 CABG x 4 (LIMA->LAD, RIMA->RCA, VG->Diag, VG->OM); b. 11/2008 Cath: patent VG's with atretic LIMA/RIMA. RCA 50; c. 12/2011 MV: EF 62%, no ischemia.  Marland Kitchen COPD (chronic obstructive pulmonary disease) ([place])   . Diverticulosis   . Family history of other neurological diseases   . Gallstones   . GI bleed 09/2019  . Hiatal hernia   . Hyperlipidemia   . Hypertensive heart disease    a. 07/2005 Echo: EF nl, mildly dil LA/RA, mild TR/AI.  Marland Kitchen Memory  loss   . Presyncope and Syncope    a. Syncopal spell @ home 01/2016.  . Schatzki's ring   . Tubular adenoma of colon     Past Surgical History:  Procedure Laterality Date  . ANGIOPLASTY    . CARDIAC CATHETERIZATION  08/22/2004  . CARDIAC CATHETERIZATION  12/17/2008  . COLONOSCOPY    . CORONARY ARTERY BYPASS GRAFT  2008   x4  . CORONARY ARTERY BYPASS GRAFT  2005   x3  . ROTATOR CUFF REPAIR Bilateral   . ROTATOR CUFF REPAIR  [id number]  . ROTATOR CUFF REPAIR  01/2003    Current Medications: Current Meds  Medication Sig  . Ascorbic Acid (VITAMIN C) 100 MG tablet Take 1 tablet by mouth daily.  . cyanocobalamin (,VITAMIN B-12,) 1000 MCG/ML injection Inject 1,000 mcg into the muscle  every 30 (thirty) days.  Marland Kitchen escitalopram (LEXAPRO) 10 MG tablet Take 10 mg by mouth daily.   . Memantine HCl-Donepezil HCl (NAMZARIC) 28-10 MG CP24 Take 1 capsule by mouth daily.  . nortriptyline (PAMELOR) 10 MG capsule Take 10 mg by mouth at bedtime.  . Omega-3 Fatty Acids (FISH OIL) 1200 MG CAPS Take 1,200 mg by mouth daily.  . QUEtiapine (SEROQUEL) 50 MG tablet Take 50 mg by mouth at bedtime.  . tamsulosin (FLOMAX) 0.4 MG CAPS capsule Take 0.4 mg by mouth daily.     Allergies:   Tape and Penicillins   Social History   Socioeconomic History  . Marital status: Married    Spouse name: Not on file  . Number of children: 2  . Years of education: Not on file  . Highest education level: Not on file  Occupational History  . Occupation: disabled  Tobacco Use  . Smoking status: Former Smoker    Years: 30.00    Quit date: 06/28/1983    Years since quitting: 36.3  . Smokeless tobacco: Never Used  Substance and Sexual Activity  . Alcohol use: Yes    Alcohol/week: 1.0 standard drinks    Types: 1 Standard drinks or equivalent per week    Comment: occ  . Drug use: No  . Sexual activity: Not on file  Other Topics Concern  . Not on file  Social History Narrative  . Not on file   Social Determinants of Health   Financial Resource Strain:   . Difficulty of Paying Living Expenses: Not on file  Food Insecurity:   . Worried About Charity fundraiser in the Last Year: Not on file  . Ran Out of Food in the Last Year: Not on file  Transportation Needs:   . Lack of Transportation (Medical): Not on file  . Lack of Transportation (Non-Medical): Not on file  Physical Activity:   . Days of Exercise per Week: Not on file  . Minutes of Exercise per Session: Not on file  Stress:   . Feeling of Stress : Not on file  Social Connections:   . Frequency of Communication with Friends and Family: Not on file  . Frequency of Social Gatherings with Friends and Family: Not on file  . Attends Religious  Services: Not on file  . Active Member of Clubs or Organizations: Not on file  . Attends Archivist Meetings: Not on file  . Marital Status: Not on file     Family History: The patient's family history includes Arthritis in his brother, maternal grandfather, mother, and sister; Asthma in his brother and mother; Cancer in his daughter and paternal  grandfather; Colon cancer in his mother; Coronary artery disease in his father; Dementia in his father and mother; Diabetes in his father and maternal grandfather; Heart attack in his mother; Parkinson's disease in his brother and maternal uncle; Stroke in his father and maternal grandmother.  ROS:   Please see the history of present illness.     All other systems reviewed and are negative.  EKGs/Labs/Other Studies Reviewed:    The following studies were reviewed today:  Echo 04/01/2016 LV EF: 55% -   60% Study Conclusions  - Left ventricle: The cavity size was normal. Wall thickness was   increased in a pattern of mild LVH. Systolic function was normal.   The estimated ejection fraction was in the range of 55% to 60%.   Wall motion was normal; there were no regional wall motion   abnormalities. Doppler parameters are consistent with abnormal   left ventricular relaxation (grade 1 diastolic dysfunction). The   E/e&' ratio is <8, suggesting normal LV filling pressure. - Aortic valve: Trileaflet. Sclerosis without stenosis. There was   trivial regurgitation. - Mitral valve: Mildly thickened leaflets . There was trivial   regurgitation. - Left atrium: The atrium was normal in size. - Right ventricle: The cavity size was mildly dilated. Systolic   function was low normal. - Right atrium: The atrium was at the upper limits of normal in   size. - Tricuspid valve: There was trivial regurgitation. - Pulmonary arteries: PA peak pressure: 26 mm Hg (S). - Inferior vena cava: The vessel was normal in size. The   respirophasic diameter  changes were in the normal range (>= 50%),   consistent with normal central venous pressure.  Impressions:  - LVEF 55-60%, mild LVH, normal wall motion, diastolic dysfunction,   normal LV filling pressure, normal LA size, mildly dilated RV   with low normal systolic function, trivial AI, MR and TR, RVSP 26   mmHg, normal IVC.    EKG:  EKG is ordered today.  The ekg ordered today demonstrates normal sinus rhythm with poor R wave progression in the anterior leads.  Recent Labs: 09/29/2019: ALT 12 09/30/2019: BUN <5; Creatinine, Ser 0.98; Hemoglobin 11.4; Platelets 198; Potassium 4.1; Sodium 137  Recent Lipid Panel    Component Value Date/Time   CHOL 176 11/02/2019 1526   TRIG 89 11/02/2019 1526   HDL 44 11/02/2019 1526   CHOLHDL 4.0 11/02/2019 1526   CHOLHDL 3.7 03/12/2016 1103   VLDL 23 03/12/2016 1103   LDLCALC 116 (H) 11/02/2019 1526    Physical Exam:    VS:  BP 120/78   Pulse 71   Temp 98.4 F (36.9 C)   Ht 5' 8.5" (1.74 m)   Wt 194 lb 9.6 oz (88.3 kg)   SpO2 95%   BMI 29.16 kg/m     Wt Readings from Last 3 Encounters:  11/02/19 194 lb 9.6 oz (88.3 kg)  09/29/19 182 lb 8.7 oz (82.8 kg)  04/23/16 182 lb 9.6 oz (82.8 kg)     GEN:  Well nourished, well developed in no acute distress HEENT: Normal NECK: No JVD; No carotid bruits LYMPHATICS: No lymphadenopathy CARDIAC: RRR, no murmurs, rubs, gallops RESPIRATORY:  Clear to auscultation without rales, wheezing or rhonchi  ABDOMEN: Soft, non-tender, non-distended MUSCULOSKELETAL:  No edema; No deformity  SKIN: Warm and dry NEUROLOGIC:  Alert and oriented x 3 PSYCHIATRIC:  Normal affect   ASSESSMENT:    1. Coronary artery disease involving coronary bypass graft of native heart  without angina pectoris   2. Hyperlipidemia, unspecified hyperlipidemia type   3. Dementia without behavioral disturbance, unspecified dementia type ([place])   4. Dyspnea on exertion    PLAN:    In order of problems listed  above:  1. Dyspnea: Obtain echocardiogram.  His wife showed me a video of him snoring during sleep, the sound appears to be coming from upper airway.    2. CAD s/p CABG: Denies any obvious anginal symptoms recently.  Obtain echocardiogram  3. Hypertension: Blood pressure stable on current therapy  4. Hyperlipidemia: Overdue for fasting lipid panel.  For some reason he is not on a statin therapy.  Start on low-dose Lipitor and recheck fasting lipid panel and LFT in 6 to 8 weeks.  5. COPD: No acute exacerbation  6. History of syncope: Occurred in 2017, no recurrence.  7. Dementia: According to his wife, he is having worsening dementia.  His wife asked me to refer him to a local neurologist to resume care.  8. Snoring: will need sleep study to check for obstructive sleep apnea.   Medication Adjustments/Labs and Tests Ordered: Current medicines are reviewed at length with the patient today.  Concerns regarding medicines are outlined above.  Orders Placed This Encounter  Procedures  . Lipid panel  . Lipid panel  . Hepatic function panel  . Ambulatory referral to Neurology  . EKG 12-Lead  . ECHOCARDIOGRAM COMPLETE   Meds ordered this encounter  Medications  . atorvastatin (LIPITOR) 20 MG tablet    Sig: Take 1 tablet (20 mg total) by mouth daily.    Dispense:  90 tablet    Refill:  3  . aspirin EC 81 MG tablet    Sig: Take 1 tablet (81 mg total) by mouth daily.    Dispense:       Patient Instructions  Medication Instructions:   START Lipitor 20 mg daily  START Aspirin 81 mg daily in 2 weeks-11/16/2019 (Can be brought over the counter) *If you need a refill on your cardiac medications before your next appointment, please call your pharmacy*  Lab Work: You will need to have labs (blood work) drawn today:  Fasting Lipid Panel Your physician recommends that you return for labs (blood work) in 3 months January 31, 2020 :  Fasting Lipid Panel-DO NOT EAT OR DRINK PAST MIDNIGHT.  WATER OK TO DRINK  Liver Function Test  If you have labs (blood work) drawn today and your tests are completely normal, you will receive your results only by: Marland Kitchen MyChart Message (if you have MyChart) OR . A paper copy in the mail If you have any lab test that is abnormal or we need to change your treatment, we will call you to review the results.  Testing/Procedures: Your physician has requested that you have an Echocardiogram. Echocardiography is a painless test that uses sound waves to create images of your heart. It provides your doctor with information about the size and shape of your heart and how well your heart's chambers and valves are working. This procedure takes approximately one hour. There are no restrictions for this procedure.   PLEASE SCHEDULE TO BE DONE WITHIN 1 MONTH  Follow-Up: At [facility], you and your health needs are our priority.  As part of our continuing mission to provide you with exceptional heart care, we have created designated Provider Care Teams.  These Care Teams include your primary Cardiologist (physician) and Advanced Practice Providers (APPs -  Physician Assistants and Nurse Practitioners) who  all work together to provide you with the care you need, when you need it.  Your next appointment:   6 month(s)  The format for your next appointment:   In Person  Provider:   Shelva Majestic, MD  Other Instructions  You have been referred to Neurology       Signed, [name], [place]  11/04/2019 11:48 PM    [place]

## 2019-11-03 LAB — LIPID PANEL
Chol/HDL Ratio: 4 ratio (ref 0.0–5.0)
Cholesterol, Total: 176 mg/dL (ref 100–199)
HDL: 44 mg/dL (ref >39)
LDL Chol Calc (NIH): 116 mg/dL — ABNORMAL HIGH (ref 0–99)
Triglycerides: 89 mg/dL (ref 0–149)
VLDL Cholesterol Cal: 16 mg/dL (ref 5–40)

## 2019-11-04 ENCOUNTER — Encounter: Payer: Self-pay | Admitting: Physician Assistant

## 2019-11-14 ENCOUNTER — Ambulatory Visit (HOSPITAL_COMMUNITY): Payer: Medicare Other | Attending: Cardiology

## 2019-11-14 ENCOUNTER — Other Ambulatory Visit: Payer: Self-pay

## 2019-11-14 DIAGNOSIS — R0600 Dyspnea, unspecified: Secondary | ICD-10-CM | POA: Diagnosis not present

## 2019-11-14 DIAGNOSIS — R0609 Other forms of dyspnea: Secondary | ICD-10-CM

## 2019-11-20 ENCOUNTER — Telehealth: Payer: Self-pay

## 2019-11-20 NOTE — Telephone Encounter (Addendum)
Called the patient and left a voice message for him to give our office a call back to discuss his ECHO results.  ----- Message from [place], [place] sent at 11/16/2019  9:56 AM EST ----- Normal pumping function, no significant valve disease, I suspect his recent dyspnea and loud breathing are not related to cardiac cause.

## 2019-11-21 NOTE — Telephone Encounter (Signed)
Returned call to wife she states that she received ECHO result but was waiting for results for pt' carotid study. I do not see that this was/ordered or done. She states that both were done at the same time. Reiterated that the carotid has not been done and she states that she can see the results in pt's Mychart. I informed her that the las carotid that was done was 2014. She states that she was probably mistaken.  She also states that her PCP wants to order a sleep study there in [facility] and pt/wife wants him to have this done here.  Called Dr Delene Ruffini office and gave them sleep coordinator's phone number to arrange this. Notified that she is out of the office and to call her on Monday.

## 2019-11-21 NOTE — Telephone Encounter (Signed)
Patient's wife is returning phone call.  °

## 2019-12-05 ENCOUNTER — Other Ambulatory Visit: Payer: Self-pay | Admitting: Physician Assistant

## 2019-12-05 DIAGNOSIS — I10 Essential (primary) hypertension: Secondary | ICD-10-CM

## 2019-12-05 DIAGNOSIS — R0602 Shortness of breath: Secondary | ICD-10-CM

## 2019-12-05 DIAGNOSIS — R0683 Snoring: Secondary | ICD-10-CM

## 2019-12-15 ENCOUNTER — Other Ambulatory Visit (HOSPITAL_COMMUNITY)
Admission: RE | Admit: 2019-12-15 | Discharge: 2019-12-15 | Disposition: A | Discharge status: Home / Self Care | Payer: Medicare Other | Source: Ambulatory Visit | Attending: Cardiovascular Disease | Admitting: Cardiovascular Disease

## 2019-12-15 DIAGNOSIS — U071 COVID-19: Secondary | ICD-10-CM | POA: Diagnosis not present

## 2019-12-15 DIAGNOSIS — Z01812 Encounter for preprocedural laboratory examination: Secondary | ICD-10-CM | POA: Diagnosis present

## 2019-12-15 LAB — SARS CORONAVIRUS 2 (TAT 6-24 HRS): SARS Coronavirus 2: POSITIVE — AB

## 2019-12-16 ENCOUNTER — Telehealth: Payer: Self-pay | Admitting: Physician Assistant

## 2019-12-16 ENCOUNTER — Telehealth (HOSPITAL_COMMUNITY): Payer: Self-pay | Admitting: Cardiology

## 2019-12-16 NOTE — Telephone Encounter (Signed)
Called patient to let him know COVID + result. Phone picked up by wife. EMS is evaluating Jose Leonard when I called as he is not feeling well. Wife is already awake of result and appreciative of call. Will cancel sleep study.

## 2019-12-18 ENCOUNTER — Encounter (HOSPITAL_BASED_OUTPATIENT_CLINIC_OR_DEPARTMENT_OTHER): Payer: Medicare Other | Admitting: Cardiovascular Disease

## 2019-12-18 NOTE — Telephone Encounter (Signed)
Called sleep center and canceled sleep study.   Routed to sleep coordinator to reschedule/follow up.

## 2019-12-21 ENCOUNTER — Institutional Professional Consult (permissible substitution): Payer: Medicare Other | Admitting: Pulmonary Disease

## 2019-12-22 ENCOUNTER — Telehealth: Payer: Self-pay

## 2019-12-22 NOTE — Telephone Encounter (Signed)
Wife calling to review quarantine requirements. Reviewed home safety and when to seek medical attention. Verbalizes understanding.

## 2020-01-01 NOTE — Telephone Encounter (Signed)
error 

## 2020-01-03 DIAGNOSIS — R1313 Dysphagia, pharyngeal phase: Secondary | ICD-10-CM | POA: Insufficient documentation

## 2020-01-04 ENCOUNTER — Encounter: Payer: Self-pay | Admitting: Neurology

## 2020-01-04 ENCOUNTER — Telehealth (INDEPENDENT_AMBULATORY_CARE_PROVIDER_SITE_OTHER): Payer: Medicare Other | Admitting: Neurology

## 2020-01-04 ENCOUNTER — Other Ambulatory Visit: Payer: Self-pay

## 2020-01-04 VITALS — Ht 68.0 in | Wt 189.0 lb

## 2020-01-04 DIAGNOSIS — F0281 Dementia in other diseases classified elsewhere with behavioral disturbance: Secondary | ICD-10-CM

## 2020-01-04 DIAGNOSIS — G301 Alzheimer's disease with late onset: Secondary | ICD-10-CM

## 2020-01-04 MED ORDER — ESCITALOPRAM OXALATE 20 MG PO TABS: 20.0000 mg | ORAL_TABLET | Freq: Every day | ORAL | 11 refills | Status: DC

## 2020-01-04 MED ORDER — DIVALPROEX SODIUM ER 250 MG PO TB24: 250.0000 mg | ORAL_TABLET | Freq: Every day | ORAL | 11 refills | Status: DC

## 2020-01-04 NOTE — Progress Notes (Signed)
Virtual Visit via Video Note The purpose of this virtual visit is to provide medical care while limiting exposure to the novel coronavirus.    Consent was obtained for video visit:  Yes.   Answered questions that patient had about telehealth interaction:  Yes.   I discussed the limitations, risks, security and privacy concerns of performing an evaluation and management service by telemedicine. I also discussed with the patient that there may be a patient responsible charge related to this service. The patient expressed understanding and agreed to proceed.  Pt location: Home Physician Location: office Name of referring provider:  [name], [place] I connected with Jose Leonard at patients initiation/request on 01/04/2020 at 10:30 AM EST by video enabled telemedicine application and verified that I am speaking with the correct person using two identifiers. Pt MRN:  [id number] Pt DOB:  [birth year] Video Participants:  Jose Leonard;  Octavia Heir (spouse)   History of Present Illness:  This is a 69 year old right-handed man with a history of hyperlipidemia, anxiety, dementia, presenting to establish care. He feels his memory is "85-90%." He lives with his wife and grandson. They have been living in a motel for the past 6 months.His wife started noticing changes for several years, probably in his early to [place]. She has been administering his medications for several years, for a time he had stopped them. They used to do bills together, but now he cannot do it, he does not know how to use the credit card or pump gas. He stopped driving 1-2 years ago, he was getting lost. He does not recognize family members such as his cousin. He denies any word-finding difficulties. He can bathe and dress himself, but his wife reports that she has to remind him that he has to use shampoo or he would put the same clothes back on. He has difficulty using his inhaler. 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 He has had bowel and bladder accidents and wears pull-ups. She has noticed more personality changes, he gets aggravated very easily, he gets mad when told how to do things, cussing a lot. Everything seems to be an argument, he asks what she is doing "a million times a day." He states his mood is okay. His wife reports he does not have an appetite and never wants to eat. He says he is not sleeping well but she watches him sleep and sees that he sleeps fine. No hallucinations but he has paranoia always feeling like they are talking about him. He has been on Namzaric for several years, no side effects. He is on Lexapro 10mg  daily and Seroquel 50mg  qhs. He has not started nortritpyline. He denies any headaches, dizziness, dysarthria/dysphagia, neck/back pain, focal numbness/tingling/weakness, anosmia, or tremors. His father and paternal grandmother had dementia. No history of significant head injuries, no alcohol use.   He underwent Neuropsychological testing at [facility] in July 2020 with note that his pattern of performance indicated prominent memory loss with evidence of rapid forgetting, consistent with Alzheimer's disease. MRI brain in 2014 for memory loss showed moderate anterior and mesial temporal atrophy, small amount of left periventricular and subcortical foci of non-specific gliosis.    PAST MEDICAL HISTORY: Past Medical History:  Diagnosis Date  . Anxiety state, unspecified   . Arthropathy, unspecified, site unspecified   . Asthma   .  BPH (benign prostatic hyperplasia)   . CAD (coronary artery disease)    a. 8/20105 CABG x 4 (LIMA->LAD, RIMA->RCA, VG->Diag, VG->OM); b. 11/2008 Cath: patent VG's with atretic LIMA/RIMA. RCA 50; c. 12/2011 MV: EF 62%, no ischemia.  Marland Kitchen COPD (chronic obstructive pulmonary disease) ([place])   . Diverticulosis   . Family history of other neurological diseases    . Gallstones   . GI bleed 09/2019  . Hiatal hernia   . Hyperlipidemia   . Hypertensive heart disease    a. 07/2005 Echo: EF nl, mildly dil LA/RA, mild TR/AI.  Marland Kitchen Memory loss   . Presyncope and Syncope    a. Syncopal spell @ home 01/2016.  . Schatzki's ring   . Tubular adenoma of colon     PAST SURGICAL HISTORY: Past Surgical History:  Procedure Laterality Date  . ANGIOPLASTY    . CARDIAC CATHETERIZATION  08/22/2004  . CARDIAC CATHETERIZATION  12/17/2008  . COLONOSCOPY    . CORONARY ARTERY BYPASS GRAFT  2008   x4  . CORONARY ARTERY BYPASS GRAFT  2005   x3  . ROTATOR CUFF REPAIR Bilateral   . ROTATOR CUFF REPAIR  [id number]  . ROTATOR CUFF REPAIR  01/2003    MEDICATIONS: Current Outpatient Medications on File Prior to Visit  Medication Sig Dispense Refill  . acetaminophen (TYLENOL) 500 MG tablet Take 1,000 mg by mouth every 8 (eight) hours as needed for mild pain or headache.    . Ascorbic Acid (VITAMIN C) 100 MG tablet Take 1 tablet by mouth daily.    Marland Kitchen aspirin EC 81 MG tablet Take 1 tablet (81 mg total) by mouth daily.    . cyanocobalamin (,VITAMIN B-12,) 1000 MCG/ML injection Inject 1,000 mcg into the muscle every 30 (thirty) days.    Marland Kitchen escitalopram (LEXAPRO) 10 MG tablet Take 10 mg by mouth daily.     . Memantine HCl-Donepezil HCl (NAMZARIC) 28-10 MG CP24 Take 1 capsule by mouth daily.    . Omega-3 Fatty Acids (FISH OIL) 1200 MG CAPS Take 1,200 mg by mouth daily.    . tamsulosin (FLOMAX) 0.4 MG CAPS capsule Take 0.4 mg by mouth daily.    Marland Kitchen albuterol (PROVENTIL HFA;VENTOLIN HFA) 108 (90 BASE) MCG/ACT inhaler Inhale 1-2 puffs into the lungs every 6 (six) hours as needed for wheezing or shortness of breath.    Marland Kitchen atorvastatin (LIPITOR) 20 MG tablet Take 1 tablet (20 mg total) by mouth daily. (Patient not taking: Reported on 01/04/2020) 90 tablet 3  . chlorpheniramine (EQ CHLORTABS) 4 MG tablet Take 4 mg by mouth at bedtime as needed for allergies.    . hydrOXYzine  (ATARAX/VISTARIL) 25 MG tablet Take 25 mg by mouth as needed.    . nortriptyline (PAMELOR) 10 MG capsule Take 10 mg by mouth at bedtime.    Marland Kitchen QUEtiapine (SEROQUEL) 50 MG tablet Take 50 mg by mouth at bedtime.     No current facility-administered medications on file prior to visit.    ALLERGIES: Allergies  Allergen Reactions  . Tape Other (See Comments)  . Penicillins Other (See Comments)    Did it involve swelling of the face/tongue/throat, SOB, or low BP? Unknown Did it involve sudden or severe rash/hives, skin peeling, or any reaction on the inside of your mouth or nose? Unknown Did you need to seek medical attention at a hospital or doctor's office? Unknown When did it last happen? If all above answers are "NO", may proceed with cephalosporin use.  Childhood allergy  FAMILY HISTORY: Family History  Problem Relation Age of Onset  . Coronary artery disease Father        bypass x4  . Dementia Father   . Diabetes Father   . Stroke Father   . Dementia Mother   . Asthma Mother   . Colon cancer Mother   . Arthritis Mother   . Heart attack Mother   . Cancer Daughter   . Arthritis Sister   . Parkinson's disease Maternal Uncle   . Stroke Maternal Grandmother   . Diabetes Maternal Grandfather   . Arthritis Maternal Grandfather   . Cancer Paternal Grandfather   . Parkinson's disease Brother   . Arthritis Brother   . Asthma Brother     Observations/Objective:   Vitals:   01/04/20 1010  Weight: 189 lb (85.7 kg)  Height: 5\' 8"  (1.727 m)   GEN:  The patient appears stated age and is in NAD.  Neurological examination: Patient is awake, alert, oriented x 3. No aphasia or dysarthria. Reduced fluency, able to follow commands. Remote and recent memory impaired. SLUMS score 13/30. [employer] Mental Exam 01/04/2020  Weekday Correct 1  Current year 1  What state are we in? 1  Amount spent 1  Amount left 2  # of Animals 1  5 objects recall 2  Number series 1   Hour markers 1  Time correct 0  Placed X in triangle correctly 1  Largest Figure 1  Name of male 0  Date back to work 0  Type of work 0  State she lived in 0  Total score 13    Cranial nerves: Extraocular movements intact with no nystagmus. No facial asymmetry. Motor: moves all extremities symmetrically, at least anti-gravity x 4. No incoordination on finger to nose testing. Gait: slightly hunched, slow and cautious, no ataxia. No tremor noted.   Assessment and Plan:   This is a 69 year old right-handed man with a history of hyperlipidemia, anxiety, dementia, presenting to establish care. Neuropsychological testing in April 2020 was consistent with a diagnosis of Alzheimer's disease. MRI brain in 2014 showed moderate anterior and mesial temporal lobe atrophy. SLUMS score today 13/30. He is on Namzaric without side effects. We discussed behavioral changes that occur as disease progresses, increase Lexapro to 20mg  daily and start Depakote ER 250mg  qhs for mood stabilization. Side effects discussed. Main issue is their living situation and home safety, he needs social work to help navigate. Resources will be provided, referral will be sent. He does not drive. Follow-up in 6 months, they know to call for any changes.    Follow Up Instructions:   -I discussed the assessment and treatment plan with the patient. The patient was provided an opportunity to ask questions and all were answered. The patient agreed with the plan and demonstrated an understanding of the instructions.   The patient was advised to call back or seek an in-person evaluation if the symptoms worsen or if the condition fails to improve as anticipated.    Cameron Sprang, MD

## 2020-01-24 ENCOUNTER — Telehealth: Payer: Self-pay

## 2020-01-24 DIAGNOSIS — G301 Alzheimer's disease with late onset: Secondary | ICD-10-CM

## 2020-01-24 DIAGNOSIS — F0281 Dementia in other diseases classified elsewhere with behavioral disturbance: Secondary | ICD-10-CM

## 2020-01-24 NOTE — Telephone Encounter (Signed)
THN orders placed

## 2020-01-30 ENCOUNTER — Other Ambulatory Visit: Payer: Self-pay | Admitting: *Deleted

## 2020-01-30 NOTE — Patient Outreach (Addendum)
[facility]) Care Management  01/30/2020  AYVAN STOCKHAUSEN 01/02/1951 [id number]   Referral received 01/25/2020 Initial Outreach 01/30/2020  Telephone Assessment  RN spoke with pt's spouse and primary caregiver Abigail Butts) due to pt's dementia. RN explained the purpose for today's cll and explained [facility] services. Discussed possible needs for this pt and offered to assist with a social worker referral. States she is a Secondary school teacher and pt rides with her daily when showing property. Reports they currently live in a hotel for the past 6 months. Requesting community resources for Dementia patients and possible solutions to accommodate pt's care. Due to her job caregiver needed to end the call indicating she would try to call back but receptive to RN case manager to go ahead and make a referral to [place] worker for the needed resources. RN informed caregiver if no call back to day RN would follow up tomorrow with another outreach to inquire further on any other needs RN can address that maybe related to his ongoing medical issues. Spouse receptive and agreed to this plan. NO other issues discussed at this time.  Plan: Will make a referral for interventions and resources vi [facility] social worker and follow up tomorrow if not sooner with this caregiver.  Addendum: Spouse called back with further information. Correction spouse is not a Secondary school teacher but seeking a new residence and applied for a 3 bedroom apartment. Currently pt/caregiver is caring for 2 grandsons with Child Protective Services involved as she attempts to care for the two children. Caregiver states her work Scientist, water quality, [employer] deliveries along with pt's SSI. States her scheduled is pretty full with taking the grandchildren drop off and picking up of her grandchildren from school everyday. Further discussed pt's issues related to pharmacy accessing cost for pt's prescription medications and verified community resources  for Dementia pts with the above referral made for  [facility] social worker. RN offered ongoing monitoring with any related medical condition pt may have for better management of care. Caregiver discussed pt's COPD as pt not on 24/7 oxygen and uses nebulizers throughout the day. States pt has had COPD for over 20 years but there is a lack of knowledge related to symptoms management. Will further education caregiver on prevention measures to lower the risk of exacerbation that may lead up to  Hospitalization. Will offer monthly or quarterly follow up with enrollment into the [facility] program based up her work schedule (receptive). Will educate on COPD action zone and possible plan intervention with acute events or episodes and what to do if acute. Will mil information packet with EMMI related topic for COPD disease management. Caregiver again receptive and grateful for the information.  Plan: Caregiver agreed to follow up call to occur monthly related to COPD management for pt and follow up on the availble resources to continue to assist pt with his ongoing management of care. No other inquires or request at this time. Will alert primary provider of pt's disposition with [facility] services.  THN CM Care Plan Problem One     Most Recent Value  Care Plan Problem One  Deficient Knowldege Related to COPD symptom management  Role Documenting the Problem One  Care Management Telephonic Coordinator  Care Plan for Problem One  Active  THN Long Term Goal   Pt/caregiver will verbalize the plan of action in the YELLOW zone within the next 90 days.  THN Long Term Goal Start Date  01/30/20  Interventions for Problem One Long Term  Goal  Will educate on the COPD and what to do if acute symptoms occur. Will email educational information for caregiver/pt to review for further interventions.  THN CM Short Term Goal #1   Pt will verbalize two symptoms of COPD exacerbation within the next 30 days  THN CM Short Term Goal #1 Start Date   01/30/20  Interventions for Short Term Goal #1  Will educate on the COPD action zones and how to manage symptoms related to exacerbation symptoms. Will continue to review COPD information on follow up calls.  THN CM Short Term Goal #2   Pt/Caregiver will be receptive to the [facility] referral to assist pt further over the next 30 days.  THN CM Short Term Goal #2 Start Date  01/30/20  Interventions for Short Term Goal #2  Will make referral to social worker for community resources and pharmacy for LIS assistance with medications cost.      Raina Mina, RN Care Management Coordinator [facility] [phone]

## 2020-01-31 ENCOUNTER — Telehealth: Payer: Self-pay | Admitting: *Deleted

## 2020-01-31 NOTE — Patient Outreach (Signed)
[place] [facility]) Care Management  01/31/2020  GANESH NEALS 05-15-1951 [id number]   CSW received referral from [place] for assistance with possible community resources for dementia resources. Patient & his wife, Abigail Butts along with their 2 grandchildren (ages 80 & 78) have been living in a hotel ([address]) for the past 6 months after their daughter left for [place], leaving them with her 2 children. CSW provided Abigail Butts with the phone number to [employer] through [place] to help with legal & financial assistance (ph#: [phone]). Patient's wife has been trying to make ends meet as a driver for [employer], [employer] & [employer] but having to keep patient with her in the car as he has Dementia and not safe to stay alone. CSW made referral to Jola Baptist with [facility], CSW also provided patient's wife with the phone number to [facility] incase she has any questions before they contact her for an intake assessment. CSW spoke with Abigail Butts about applying for Medicaid for patient, she informed CSW that he had Medicaid in the past but that it only covered his Medicare premiums. CSW informed her that [facility] would assist with Medicaid application. CSW also provided Abigail Butts with the website socialserve.com to help with locating housing. CSW encouraged her to call back with any questions or concerns but to keep an eye out for a call from [facility] within the next day or two.    Raynaldo Opitz, LCSW [facility]  Clinical Social Worker cell #: [phone]

## 2020-02-01 ENCOUNTER — Ambulatory Visit: Payer: Medicare Other | Attending: Internal Medicine

## 2020-02-01 ENCOUNTER — Other Ambulatory Visit: Payer: Self-pay | Admitting: *Deleted

## 2020-02-01 DIAGNOSIS — Z23 Encounter for immunization: Secondary | ICD-10-CM

## 2020-02-01 NOTE — Patient Outreach (Signed)
[facility]) Care Management  02/01/2020  Jose Leonard [birth year] [id number]   Telephone Consult   RN spoke with pt's spouse today Jose Leonard) concerning pending resources and services. RN verified [facility] social worker Claiborne Billings) has spoke with her on available community resources to assist with Dementia resources to assist the spouse due to her work scheduled and the pt's needs. RN has explained the pt's primary's office should be able to assist her with most of her needs. Informed the caregiver that this RN case manager has spoken with the provider's office and provided a  Direct contact number that she can call to reach for further assistance.  RN spoke with Lajuana Matte (operation assistance) who provider today's information and contact number for this member. The representative indicated the person that would be able to assist further is called a "Specialist". RN expressed the possible needs and updated the pt's caregiver on the above information. Wife Jose Leonard very appreciative for the assistance and information provider today. RN has informed the caregiver that the pt's neurologist will be updated due to the initial referral came from that office on what has been initiated for pt centered care on this pt.   Aware she can contact this RN case manager/THN with additional direction to any other community resources however a [facility] packet has already been mailed along with a [facility] calender that has such resources. No other inquires or request at this time.   Plan: Case will be closed.  Raina Mina, RN Care Management Coordinator [place] Office [phone]

## 2020-02-01 NOTE — [facility] Notes (Signed)
   Covid-19 Vaccination Clinic  Name:  Jose Leonard    MRN: [id number] DOB: 1951-07-18  02/01/2020  Jose Leonard was observed post Covid-19 immunization for 15 minutes without incident. He was provided with Vaccine Information Sheet and instruction to access the V-Safe system.   Jose Leonard was instructed to call 911 with any severe reactions post vaccine: Marland Kitchen Difficulty breathing  . Swelling of face and throat  . A fast heartbeat  . A bad rash all over body  . Dizziness and weakness   Immunizations Administered    Name Date Dose VIS Date Route   Pfizer COVID-19 Vaccine 02/01/2020 10:16 AM 0.3 mL 11/03/2019 Intramuscular   Manufacturer: [place]   Lot: [id number]   [place]: [id number]

## 2020-02-05 ENCOUNTER — Telehealth: Payer: Self-pay | Admitting: *Deleted

## 2020-02-05 NOTE — Telephone Encounter (Signed)
Notified patient's wife of sleep study and COVID appointment details.

## 2020-02-23 ENCOUNTER — Other Ambulatory Visit (HOSPITAL_COMMUNITY): Payer: Medicare Other

## 2020-02-25 ENCOUNTER — Encounter (HOSPITAL_BASED_OUTPATIENT_CLINIC_OR_DEPARTMENT_OTHER): Payer: Medicare Other | Admitting: Cardiovascular Disease

## 2020-02-26 ENCOUNTER — Ambulatory Visit: Payer: Medicare Other | Attending: Internal Medicine

## 2020-02-26 DIAGNOSIS — Z23 Encounter for immunization: Secondary | ICD-10-CM

## 2020-02-26 NOTE — Progress Notes (Signed)
   Covid-19 Vaccination Clinic  Name:  Jose Leonard    MRN: [id number] DOB: 10/31/1951  02/26/2020  Jose Leonard was observed post Covid-19 immunization for 15 minutes without incident. He was provided with Vaccine Information Sheet and instruction to access the V-Safe system.   Jose Leonard was instructed to call 911 with any severe reactions post vaccine: Marland Kitchen Difficulty breathing  . Swelling of face and throat  . A fast heartbeat  . A bad rash all over body  . Dizziness and weakness   Immunizations Administered    Name Date Dose VIS Date Route   Pfizer COVID-19 Vaccine 02/26/2020 12:04 PM 0.3 mL 11/03/2019 Intramuscular   Manufacturer: [place]   Lot: [id number]   [place]: [id number]

## 2020-04-30 ENCOUNTER — Telehealth: Payer: Self-pay

## 2020-04-30 NOTE — Telephone Encounter (Signed)
   [facility] Pre-operative Risk Assessment    HEARTCARE STAFF: - Please ensure there is not already an duplicate clearance open for this procedure. - Under Visit Info/Reason for Call, type in Other and utilize the format Clearance MM/DD/YY or Clearance TBD. Do not use dashes or single digits. - If request is for dental extraction, please clarify the # of teeth to be extracted.  Request for surgical clearance:  1. What type of surgery is being performed? Left Long Revision Amputation   2. When is this surgery scheduled? 05/10/20   3. What type of clearance is required (medical clearance vs. Pharmacy clearance to hold med vs. Both)? Both  4. Are there any medications that need to be held prior to surgery and how long? ASA   5. Practice name and name of physician performing surgery? [facility]; Dr. Charlotte Crumb   6. What is the office phone number? [phone]   7.   What is the office fax number? [phone] Attn: Cyndee Brightly, RN  8.   Anesthesia type (None, local, MAC, general) ? Local/Axillary Block/MAC   Jose Leonard 04/30/2020, 6:07 PM  _________________________________________________________________   (provider comments below)

## 2020-05-01 ENCOUNTER — Other Ambulatory Visit: Payer: Self-pay | Admitting: Orthopedic Surgery

## 2020-05-01 NOTE — Telephone Encounter (Deleted)
   Primary Cardiologist:Thomas Claiborne Billings, MD  Chart reviewed as part of pre-operative protocol coverage. Because of JOHAAN RYSER past medical history and time since last visit, they will require a follow-up visit in order to better assess preoperative cardiovascular risk.  Pre-op covering staff: - Please schedule appointment and call patient to inform them. If patient already had an upcoming appointment within acceptable timeframe, please add "pre-op clearance" to the appointment notes so provider is aware. - Please contact requesting surgeon's office via preferred method (i.e, phone, fax) to inform them of need for appointment prior to surgery.  If applicable, this message will also be routed to pharmacy pool and/or primary cardiologist for input on holding anticoagulant/antiplatelet agent as requested below so that this information is available to the clearing provider at time of patient's appointment.   Darreld Mclean, PA-C  05/01/2020, 9:14 AM

## 2020-05-01 NOTE — Telephone Encounter (Signed)
Hi. Dr. Claiborne Billings,  Patient is scheduled for left long revision amputation on 05/10/2020 and is being asked to hold Aspirin. He has a history of CAD s/p CABG x4 in 06/2004 (LIMA-LAD, RIMA-RCA, SVG-diagonal, SVG-OM). Last heart cath in 11/2008 showed patent SVG with atretic LIMA and RIMa.  Myoview in 2013 showed EF 62%, no ischemia. His has had very sporadic cardiology follow-up but was last seen by [facility] in 10/2019 at which time he denied any chest pain but did report some dyspnea. Echo was ordered and showed LVEF of 60-65% with normal wall motion and no significant valvular disease. Can you please comment if it is OK to hold Aspirin for upcoming procedure?  Please route response back to P CV DIV PREOP.  Thank you! Letti Towell

## 2020-05-07 ENCOUNTER — Other Ambulatory Visit (HOSPITAL_COMMUNITY)
Admission: RE | Admit: 2020-05-07 | Discharge: 2020-05-07 | Disposition: A | Discharge status: Home / Self Care | Payer: Medicare Other | Source: Ambulatory Visit | Attending: Orthopedic Surgery | Admitting: Orthopedic Surgery

## 2020-05-07 DIAGNOSIS — Z20822 Contact with and (suspected) exposure to covid-19: Secondary | ICD-10-CM | POA: Diagnosis not present

## 2020-05-07 DIAGNOSIS — Z01812 Encounter for preprocedural laboratory examination: Secondary | ICD-10-CM | POA: Diagnosis present

## 2020-05-07 LAB — SARS CORONAVIRUS 2 (TAT 6-24 HRS): SARS Coronavirus 2: NEGATIVE

## 2020-05-09 ENCOUNTER — Encounter (HOSPITAL_COMMUNITY): Payer: Self-pay | Admitting: Orthopedic Surgery

## 2020-05-09 ENCOUNTER — Other Ambulatory Visit: Payer: Self-pay

## 2020-05-09 NOTE — Anesthesia Preprocedure Evaluation (Addendum)
Anesthesia Evaluation  Patient identified by MRN, date of birth, ID band Patient awake    Reviewed: Allergy & Precautions, NPO status , Patient's Chart, lab work & pertinent test results  History of Anesthesia Complications Negative for: history of anesthetic complications  Airway Mallampati: II  TM Distance: >3 FB Neck ROM: Full    Dental  (+) Dental Advisory Given, Edentulous Lower, Edentulous Upper   Pulmonary asthma , COPD,  oxygen dependent, former smoker,   Per wife there is concern of OSA but he has not yet had evaluation for this.  Pulmonary disease is followed by primary care provider.  He has recently been referred to pulmonary specialist Dr. Vaughan Browner but not yet been seen.    Pulmonary exam normal breath sounds clear to auscultation       Cardiovascular hypertension, + CAD, + Past MI and + CABG  Normal cardiovascular exam Rhythm:Regular Rate:Normal  Follows with cardiology, last seen 11/02/2019, denied any obvious anginal symptoms. Patient's wife did report some upper airway noise during sleep.  Echocardiogram was ordered.  Echocardiogram obtained   '20 TTE - EF 60-65%, mild LVH, trivial AI, no other valvular abnormalities.     Neuro/Psych  Headaches, PSYCHIATRIC DISORDERS Anxiety Depression Dementia    GI/Hepatic Neg liver ROS, hiatal hernia,   Endo/Other  negative endocrine ROS  Renal/GU negative Renal ROS     Musculoskeletal negative musculoskeletal ROS (+)   Abdominal   Peds  Hematology negative hematology ROS (+)   Anesthesia Other Findings Covid test negative Covid+ 11/2019  Reproductive/Obstetrics                           Anesthesia Physical Anesthesia Plan  ASA: III  Anesthesia Plan: MAC and Regional   Post-op Pain Management:    Induction: Intravenous  PONV Risk Score and Plan: 1 and Propofol infusion and Treatment may vary due to age or medical  condition  Airway Management Planned: Natural Airway and Simple Face Mask  Additional Equipment: None  Intra-op Plan:   Post-operative Plan:   Informed Consent: I have reviewed the patients History and Physical, chart, labs and discussed the procedure including the risks, benefits and alternatives for the proposed anesthesia with the patient or authorized representative who has indicated his/her understanding and acceptance.     Dental advisory given  Plan Discussed with: CRNA and Anesthesiologist  Anesthesia Plan Comments: ( )     Anesthesia Quick Evaluation

## 2020-05-09 NOTE — Progress Notes (Addendum)
There is a note in Epic asking Dr. Claiborne Billings if it is ok to hold Aspirin, pre- op.I see a note from a cardi oology PA-C sending the information to Dr. Claiborne Billings.  I called Dr.Weingold's office  And spoke to Rhonda,who was surprised that it was questioned from their office, "Dr. Burney Gauze never stops Aspirin."  Jose Leonard sent the note to Dr. Evette Georges office.  Jose Leonard has Alzheimers, I spoke to 3 his wife Jose Leonard. Jose Leonard walks frequently, "he will have to be watched or he will get up to walk off".  I told Jose Leonard that she may come to the pre- op area with Jose Leonard tomorrow. 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  Any instructions will need to be given to Jose Leonard.  Jose Leonard tested negative for Covid on 05/07/2020 and has been in quarantine with Jose Leonard since that time.  Jose Leonard has COPD, he wears 2l of oxygen 24/7, if he takes it off, especially at night,sats drop to the 70"s. Jose Leonard does not see a pulmonoligist, he is followed by his PCP at [facility]. IOam requesting office notes, labs, any cardiaac or Pulmonary records. Jose Leonard was scheduled for  have a sleep test 11/2019, but Covid test came back positive.  Cardiologist is Dr. Claiborne Billings, Aspirn has not been stopped.

## 2020-05-09 NOTE — Progress Notes (Signed)
Anesthesia Chart Review: Same day workup  Follows with cardiology for CAD s/p CABG x4 in 06/2004 (LIMA-LAD, RIMA-RCA, SVG-diagonal, SVG-OM), HTN, HLD, COPD, and history of syncope in 2017 without recurrence.  Last cardiac catheterization was performed in January 2010 which showed patent SVG with atretic LIMA and RIMa.  Myoview in 2013 showed EF 62%, no ischemia.  Last seen 11/02/2019, Denied any obvious anginal symptoms.  Patient's wife did report some upper airway noise during sleep.  Echocardiogram was ordered.  Echocardiogram obtained 11/14/2019 showed EF 60-65%, no significant valvular abnormalities.  He was scheduled to have a sleep test in January 2021 but preprocedural Covid test came back positive and he could not have test.  Covid test 05/07/2020 is negative.  Patient has COPD with chronic respiratory failure and is on 2 L supplemental oxygen 24/7.  He is also maintained on budesonide and as needed albuterol.  Per wife there is also concern of OSA but he has not yet had evaluation for this.  Pulmonary disease is followed by primary care provider.  He has recently been referred to pulmonary specialist Dr. Vaughan Browner but not yet been seen.  Patient with worsening dementia, needs wife to answer questions.  Per wife he is also apt to get up and wander off if left alone.  Patient will need day of surgery labs and evaluation.  EKG 11/02/2019: NSR with sinus arrhythmia.  Rate 72.  Possible anterior infarct, age undetermined.  TTE 01/04/2019: 1. Left ventricular ejection fraction, by visual estimation, is 60 to  65%. The left ventricle has normal function. There is mildly increased  left ventricular hypertrophy.  2. Left ventricular diastolic parameters are indeterminate.  3. The left ventricle has no regional wall motion abnormalities.  4. Global right ventricle has normal systolic function.The right  ventricular size is normal. No increase in right ventricular wall  thickness.  5. Left atrial  size was normal.  6. Right atrial size was normal.  7. The mitral valve is normal in structure. No evidence of mitral valve  regurgitation. No evidence of mitral stenosis.  8. The tricuspid valve is normal in structure. Tricuspid valve  regurgitation is not demonstrated.  9. The aortic valve is normal in structure. Aortic valve regurgitation is  trivial. No evidence of aortic valve sclerosis or stenosis.  10. The pulmonic valve was normal in structure. Pulmonic valve  regurgitation is not visualized.  11. The inferior vena cava is normal in size with greater than 50%  respiratory variability, suggesting right atrial pressure of 3 mmHg.  Event monitor 03/12/2016: Sinus rhythm without ectopy.  No bradycardia or tachycardia.  Nuclear stress 01/12/2012: Impression: 1.  There is no scintigraphic evidence of inducible myocardial ischemia. 2.  The post stress ejection fraction 60%.  No significant wall motion normalities noted. 3.  Exercise capacity 7 METS.  EKG shows NSR 71.  There is grouped beating (?  Atrial bigeminy) noted during recovery.  No exercise-induced ischemic EKG changes noted.  THR achieved.  Fair exercise capacity.?  Short run of atrial tachycardia. 4.  Normal myocardial perfusion study. 5.  This is a low risk scan.   Wynonia Musty [facility] Short Stay Center/Anesthesiology Phone [phone] 05/09/2020 11:38 AM

## 2020-05-10 ENCOUNTER — Ambulatory Visit (HOSPITAL_COMMUNITY): Payer: Medicare Other | Admitting: Physician Assistant

## 2020-05-10 ENCOUNTER — Other Ambulatory Visit: Payer: Self-pay

## 2020-05-10 ENCOUNTER — Encounter (HOSPITAL_COMMUNITY): Payer: Self-pay | Admitting: Orthopedic Surgery

## 2020-05-10 ENCOUNTER — Ambulatory Visit (HOSPITAL_COMMUNITY)
Admission: RE | Admit: 2020-05-10 | Discharge: 2020-05-10 | Disposition: A | Discharge status: Home / Self Care | Payer: Medicare Other | Attending: Orthopedic Surgery | Admitting: Orthopedic Surgery

## 2020-05-10 ENCOUNTER — Encounter (HOSPITAL_COMMUNITY)
Admission: RE | Disposition: A | Discharge status: Home / Self Care | Payer: Self-pay | Source: Home / Self Care | Attending: Orthopedic Surgery

## 2020-05-10 DIAGNOSIS — F329 Major depressive disorder, single episode, unspecified: Secondary | ICD-10-CM | POA: Diagnosis not present

## 2020-05-10 DIAGNOSIS — I2510 Atherosclerotic heart disease of native coronary artery without angina pectoris: Secondary | ICD-10-CM | POA: Diagnosis not present

## 2020-05-10 DIAGNOSIS — F419 Anxiety disorder, unspecified: Secondary | ICD-10-CM | POA: Diagnosis not present

## 2020-05-10 DIAGNOSIS — L608 Other nail disorders: Secondary | ICD-10-CM | POA: Diagnosis not present

## 2020-05-10 DIAGNOSIS — I252 Old myocardial infarction: Secondary | ICD-10-CM | POA: Insufficient documentation

## 2020-05-10 DIAGNOSIS — Z8616 Personal history of COVID-19: Secondary | ICD-10-CM | POA: Insufficient documentation

## 2020-05-10 DIAGNOSIS — Z7982 Long term (current) use of aspirin: Secondary | ICD-10-CM | POA: Diagnosis not present

## 2020-05-10 DIAGNOSIS — I119 Hypertensive heart disease without heart failure: Secondary | ICD-10-CM | POA: Diagnosis not present

## 2020-05-10 DIAGNOSIS — F0390 Unspecified dementia without behavioral disturbance: Secondary | ICD-10-CM | POA: Insufficient documentation

## 2020-05-10 DIAGNOSIS — E785 Hyperlipidemia, unspecified: Secondary | ICD-10-CM | POA: Insufficient documentation

## 2020-05-10 DIAGNOSIS — Z7951 Long term (current) use of inhaled steroids: Secondary | ICD-10-CM | POA: Diagnosis not present

## 2020-05-10 DIAGNOSIS — I10 Essential (primary) hypertension: Secondary | ICD-10-CM | POA: Diagnosis not present

## 2020-05-10 DIAGNOSIS — Z87891 Personal history of nicotine dependence: Secondary | ICD-10-CM | POA: Diagnosis not present

## 2020-05-10 DIAGNOSIS — Z9981 Dependence on supplemental oxygen: Secondary | ICD-10-CM | POA: Insufficient documentation

## 2020-05-10 DIAGNOSIS — Z79899 Other long term (current) drug therapy: Secondary | ICD-10-CM | POA: Insufficient documentation

## 2020-05-10 DIAGNOSIS — Z951 Presence of aortocoronary bypass graft: Secondary | ICD-10-CM | POA: Diagnosis not present

## 2020-05-10 DIAGNOSIS — J449 Chronic obstructive pulmonary disease, unspecified: Secondary | ICD-10-CM | POA: Insufficient documentation

## 2020-05-10 HISTORY — DX: Dyspnea, unspecified: R06.00

## 2020-05-10 HISTORY — DX: Acute myocardial infarction, unspecified: I21.9

## 2020-05-10 HISTORY — DX: Depression, unspecified: F32.A

## 2020-05-10 HISTORY — DX: Unspecified dementia, unspecified severity, without behavioral disturbance, psychotic disturbance, mood disturbance, and anxiety: F03.90

## 2020-05-10 HISTORY — PX: AMPUTATION: SHX166

## 2020-05-10 LAB — CBC
HCT: 44.7 % (ref 39.0–52.0)
Hemoglobin: 14.1 g/dL (ref 13.0–17.0)
MCH: 29.4 pg (ref 26.0–34.0)
MCHC: 31.5 g/dL (ref 30.0–36.0)
MCV: 93.3 fL (ref 80.0–100.0)
Platelets: 244 10*3/uL (ref 150–400)
RBC: 4.79 MIL/uL (ref 4.22–5.81)
RDW: 13.2 % (ref 11.5–15.5)
WBC: 6.5 10*3/uL (ref 4.0–10.5)
nRBC: 0 % (ref 0.0–0.2)

## 2020-05-10 LAB — BASIC METABOLIC PANEL
Anion gap: 9 (ref 5–15)
BUN: 7 mg/dL — ABNORMAL LOW (ref 8–23)
CO2: 24 mmol/L (ref 22–32)
Calcium: 8.9 mg/dL (ref 8.9–10.3)
Chloride: 107 mmol/L (ref 98–111)
Creatinine, Ser: 1.05 mg/dL (ref 0.61–1.24)
GFR calc Af Amer: >60 mL/min (ref >60)
GFR calc non Af Amer: >60 mL/min (ref >60)
Glucose, Bld: 93 mg/dL (ref 70–99)
Potassium: 3.9 mmol/L (ref 3.5–5.1)
Sodium: 140 mmol/L (ref 135–145)

## 2020-05-10 SURGERY — AMPUTATION DIGIT
Anesthesia: Monitor Anesthesia Care | Laterality: Left

## 2020-05-10 MED ORDER — LACTATED RINGERS IV SOLN: INTRAVENOUS | Status: DC

## 2020-05-10 MED ORDER — CHLORHEXIDINE GLUCONATE 0.12 % MT SOLN: 15.0000 mL | Freq: Once | OROMUCOSAL | Status: AC

## 2020-05-10 MED ORDER — ROPIVACAINE HCL 7.5 MG/ML IJ SOLN
INTRAMUSCULAR | Status: DC | PRN
  Administered 2020-05-10: 30 mL via PERINEURAL

## 2020-05-10 MED ORDER — PHENYLEPHRINE 40 MCG/ML (10ML) SYRINGE FOR IV PUSH (FOR BLOOD PRESSURE SUPPORT)
PREFILLED_SYRINGE | INTRAVENOUS | Status: AC
  Filled 2020-05-10: qty 10

## 2020-05-10 MED ORDER — EPHEDRINE SULFATE 50 MG/ML IJ SOLN
INTRAMUSCULAR | Status: DC | PRN
Start: 2020-05-10 — End: 2020-05-10
  Administered 2020-05-10: 5 mg via INTRAVENOUS

## 2020-05-10 MED ORDER — MIDAZOLAM HCL 2 MG/2ML IJ SOLN
INTRAMUSCULAR | Status: AC
  Administered 2020-05-10: 1 mg via INTRAVENOUS
  Filled 2020-05-10: qty 2

## 2020-05-10 MED ORDER — PHENYLEPHRINE HCL-NACL 10-0.9 MG/250ML-% IV SOLN
INTRAVENOUS | Status: DC | PRN
Start: 2020-05-10 — End: 2020-05-10
  Administered 2020-05-10: 50 ug/min via INTRAVENOUS

## 2020-05-10 MED ORDER — CLINDAMYCIN PHOSPHATE 900 MG/50ML IV SOLN
INTRAVENOUS | Status: AC
  Filled 2020-05-10: qty 50

## 2020-05-10 MED ORDER — PROPOFOL 10 MG/ML IV BOLUS
INTRAVENOUS | Status: DC | PRN
  Administered 2020-05-10: 20 mg via INTRAVENOUS

## 2020-05-10 MED ORDER — FENTANYL CITRATE (PF) 250 MCG/5ML IJ SOLN
INTRAMUSCULAR | Status: AC
  Filled 2020-05-10: qty 5

## 2020-05-10 MED ORDER — CLINDAMYCIN PHOSPHATE 900 MG/50ML IV SOLN
INTRAVENOUS | Status: DC | PRN
  Administered 2020-05-10: 900 mg via INTRAVENOUS

## 2020-05-10 MED ORDER — MIDAZOLAM HCL 2 MG/2ML IJ SOLN: 1.0000 mg | Freq: Once | INTRAMUSCULAR | Status: AC

## 2020-05-10 MED ORDER — 0.9 % SODIUM CHLORIDE (POUR BTL) OPTIME
TOPICAL | Status: DC | PRN
  Administered 2020-05-10: 1000 mL

## 2020-05-10 MED ORDER — CHLORHEXIDINE GLUCONATE 0.12 % MT SOLN
OROMUCOSAL | Status: AC
  Administered 2020-05-10: 15 mL via OROMUCOSAL
  Filled 2020-05-10: qty 15

## 2020-05-10 MED ORDER — ONDANSETRON HCL 4 MG/2ML IJ SOLN
INTRAMUSCULAR | Status: DC | PRN
  Administered 2020-05-10: 4 mg via INTRAVENOUS

## 2020-05-10 MED ORDER — MIDAZOLAM HCL 2 MG/2ML IJ SOLN
INTRAMUSCULAR | Status: AC
  Filled 2020-05-10: qty 2

## 2020-05-10 MED ORDER — PROPOFOL 500 MG/50ML IV EMUL
INTRAVENOUS | Status: DC | PRN
  Administered 2020-05-10: 100 ug/kg/min via INTRAVENOUS

## 2020-05-10 MED ORDER — ORAL CARE MOUTH RINSE: 15.0000 mL | Freq: Once | OROMUCOSAL | Status: AC

## 2020-05-10 MED ORDER — PHENYLEPHRINE HCL (PRESSORS) 10 MG/ML IV SOLN
INTRAVENOUS | Status: DC | PRN
  Administered 2020-05-10 (×2): 120 ug via INTRAVENOUS

## 2020-05-10 MED ORDER — EPHEDRINE 5 MG/ML INJ
INTRAVENOUS | Status: AC
  Filled 2020-05-10: qty 10

## 2020-05-10 MED ORDER — FENTANYL CITRATE (PF) 100 MCG/2ML IJ SOLN: 50.0000 ug | Freq: Once | INTRAMUSCULAR | Status: AC

## 2020-05-10 MED ORDER — FENTANYL CITRATE (PF) 100 MCG/2ML IJ SOLN
INTRAMUSCULAR | Status: AC
  Administered 2020-05-10: 50 ug via INTRAVENOUS
  Filled 2020-05-10: qty 2

## 2020-05-10 SURGICAL SUPPLY — 47 items
BNDG CMPR 9X4 STRL LF SNTH (GAUZE/BANDAGES/DRESSINGS) ×1
BNDG COHESIVE 1X5 TAN STRL LF (GAUZE/BANDAGES/DRESSINGS) ×2 IMPLANT
BNDG CONFORM 2 STRL LF (GAUZE/BANDAGES/DRESSINGS) IMPLANT
BNDG ELASTIC 3X5.8 VLCR STR LF (GAUZE/BANDAGES/DRESSINGS) IMPLANT
BNDG ESMARK 4X9 LF (GAUZE/BANDAGES/DRESSINGS) ×3 IMPLANT
BNDG GAUZE ELAST 4 BULKY (GAUZE/BANDAGES/DRESSINGS) IMPLANT
CLOSURE WOUND 1/2 X4 (GAUZE/BANDAGES/DRESSINGS)
CORD BIPOLAR FORCEPS 12FT (ELECTRODE) ×3 IMPLANT
COVER SURGICAL LIGHT HANDLE (MISCELLANEOUS) ×3 IMPLANT
COVER WAND RF STERILE (DRAPES) ×3 IMPLANT
CUFF TOURN SGL QUICK 18X4 (TOURNIQUET CUFF) IMPLANT
CUFF TOURN SGL QUICK 24 (TOURNIQUET CUFF)
CUFF TRNQT CYL 24X4X16.5-23 (TOURNIQUET CUFF) IMPLANT
DRAPE OEC MINIVIEW 54X84 (DRAPES) IMPLANT
DRAPE SURG 17X23 STRL (DRAPES) ×3 IMPLANT
DRSG XEROFORM 1X8 (GAUZE/BANDAGES/DRESSINGS) ×2 IMPLANT
DURAPREP 26ML APPLICATOR (WOUND CARE) ×3 IMPLANT
GAUZE SPONGE 2X2 8PLY STRL LF (GAUZE/BANDAGES/DRESSINGS) IMPLANT
GAUZE SPONGE 4X4 12PLY STRL (GAUZE/BANDAGES/DRESSINGS) ×2 IMPLANT
GAUZE XEROFORM 1X8 LF (GAUZE/BANDAGES/DRESSINGS) IMPLANT
GLOVE SURG SYN 8.0 (GLOVE) ×3 IMPLANT
GLOVE SURG SYN 8.0 PF PI (GLOVE) ×1 IMPLANT
GOWN STRL REUS W/ TWL LRG LVL3 (GOWN DISPOSABLE) ×1 IMPLANT
GOWN STRL REUS W/ TWL XL LVL3 (GOWN DISPOSABLE) ×1 IMPLANT
GOWN STRL REUS W/TWL LRG LVL3 (GOWN DISPOSABLE) ×3
GOWN STRL REUS W/TWL XL LVL3 (GOWN DISPOSABLE) ×3
KIT BASIN OR (CUSTOM PROCEDURE TRAY) ×3 IMPLANT
KIT TURNOVER KIT B (KITS) ×3 IMPLANT
MANIFOLD NEPTUNE II (INSTRUMENTS) ×3 IMPLANT
NDL HYPO 25GX1X1/2 BEV (NEEDLE) IMPLANT
NEEDLE HYPO 25GX1X1/2 BEV (NEEDLE) IMPLANT
NS IRRIG 1000ML POUR BTL (IV SOLUTION) ×3 IMPLANT
PACK ORTHO EXTREMITY (CUSTOM PROCEDURE TRAY) ×3 IMPLANT
PAD ARMBOARD 7.5X6 YLW CONV (MISCELLANEOUS) ×6 IMPLANT
PAD CAST 3X4 CTTN HI CHSV (CAST SUPPLIES) IMPLANT
PADDING CAST COTTON 3X4 STRL (CAST SUPPLIES)
SPECIMEN JAR SMALL (MISCELLANEOUS) ×3 IMPLANT
SPONGE GAUZE 2X2 STER 10/PKG (GAUZE/BANDAGES/DRESSINGS)
STRIP CLOSURE SKIN 1/2X4 (GAUZE/BANDAGES/DRESSINGS) IMPLANT
SUCTION FRAZIER HANDLE 10FR (MISCELLANEOUS)
SUCTION TUBE FRAZIER 10FR DISP (MISCELLANEOUS) IMPLANT
TOWEL GREEN STERILE (TOWEL DISPOSABLE) ×3 IMPLANT
TOWEL GREEN STERILE FF (TOWEL DISPOSABLE) ×3 IMPLANT
TUBE CONNECTING 12'X1/4 (SUCTIONS)
TUBE CONNECTING 12X1/4 (SUCTIONS) IMPLANT
UNDERPAD 30X36 HEAVY ABSORB (UNDERPADS AND DIAPERS) ×3 IMPLANT
WATER STERILE IRR 1000ML POUR (IV SOLUTION) ×3 IMPLANT

## 2020-05-10 NOTE — Op Note (Signed)
Patient was taken to the operating suite and after induction of adequate regional anesthetic and IV sedation the left upper extremity was prepped and draped in the usual sterile fashion.  An Esmarch was used to exsanguinate the limb and the tourniquet was inflated to 250 mmHg.  At this point time left long finger was approached surgically where a fishmouth incision was made and flaps were raised dorsally and palmarly.  We disarticulated the distal interphalangeal joint and removed a small osseous fragment of the base of the distal phalanx from previous trauma.  We carefully excised any remaining nail matrix dorsally.  We then thoroughly irrigated and performed bilateral neurectomies.  We then advanced the volar flap dorsally to close after removing a small bit of the distal aspect of the middle phalanx using a rondure to contour the condyles.  This was done with 4-0 Monocryl.  We then dressed with Xeroform, 4 x 4's, compression bandage.  The patient tolerated this procedure well went to recovery in stable fashion.

## 2020-05-10 NOTE — H&P (Signed)
Jose Leonard is an 69 y.o. male.   Chief Complaint: l long pain and defeormityHPI: 69 year old malw with chronic left long nail deformity  Past Medical History:  Diagnosis Date  . Anxiety state, unspecified   . Arthropathy, unspecified, site unspecified   . Asthma   . BPH (benign prostatic hyperplasia)   . CAD (coronary artery disease)    a. 8/20105 CABG x 4 (LIMA->LAD, RIMA->RCA, VG->Diag, VG->OM); b. 11/2008 Cath: patent VG's with atretic LIMA/RIMA. RCA 50; c. 12/2011 MV: EF 62%, no ischemia.  Marland Kitchen COPD (chronic obstructive pulmonary disease) ([place])   . Dementia ([place])   . Depression   . Diverticulosis   . Dyspnea    Sats drop when he is not wearing oxygen  . Family history of other neurological diseases   . Gallstones   . GI bleed 09/2019  . Hiatal hernia   . Hyperlipidemia   . Hypertensive heart disease    a. 07/2005 Echo: EF nl, mildly dil LA/RA, mild TR/AI.  Marland Kitchen Memory loss   . Myocardial infarction ([place])   . Presyncope and Syncope    a. Syncopal spell @ home 01/2016.  . Schatzki's ring   . Tubular adenoma of colon     Past Surgical History:  Procedure Laterality Date  . ANGIOPLASTY    . CARDIAC CATHETERIZATION  08/22/2004  . CARDIAC CATHETERIZATION  12/17/2008  . COLONOSCOPY    . CORONARY ARTERY BYPASS GRAFT  2005   x3  . ESOPHAGOGASTRODUODENOSCOPY    . ROTATOR CUFF REPAIR Bilateral   . ROTATOR CUFF REPAIR  [id number]  . ROTATOR CUFF REPAIR  01/2003    Family History  Problem Relation Age of Onset  . Coronary artery disease Father        bypass x4  . Dementia Father   . Diabetes Father   . Stroke Father   . Dementia Mother   . Asthma Mother   . Colon cancer Mother   . Arthritis Mother   . Heart attack Mother   . Cancer Daughter   . Arthritis Sister   . Parkinson's disease Maternal Uncle   . Stroke Maternal Grandmother   . Diabetes Maternal Grandfather   . Arthritis Maternal Grandfather   . Cancer Paternal Grandfather   . Parkinson's disease Brother   .  Arthritis Brother   . Asthma Brother    Social History:  reports that he quit smoking about 36 years ago. He quit after 30.00 years of use. He has never used smokeless tobacco. He reports previous alcohol use of about 1.0 standard drink of alcohol per week. He reports that he does not use drugs.  Allergies:  Allergies  Allergen Reactions  . Tape Other (See Comments)    Skin turns red and draws blood   . Penicillins Other (See Comments)    Did it involve swelling of the face/tongue/throat, SOB, or low BP? Unknown Did it involve sudden or severe rash/hives, skin peeling, or any reaction on the inside of your mouth or nose? Unknown Did you need to seek medical attention at a hospital or doctor's office? Unknown When did it last happen? If all above answers are "NO", may proceed with cephalosporin use.  Childhood allergy    Medications Prior to Admission  Medication Sig Dispense Refill  . acetaminophen (TYLENOL) 500 MG tablet Take 1,000 mg by mouth every 8 (eight) hours as needed for mild pain or headache.    . albuterol (PROVENTIL) (2.5 MG/3ML) 0.083% nebulizer solution Take  2.5 mg by nebulization every 6 (six) hours as needed for wheezing or shortness of breath.    Marland Kitchen aspirin EC 81 MG tablet Take 1 tablet (81 mg total) by mouth daily.    . budesonide (PULMICORT) 0.25 MG/2ML nebulizer solution Take 0.25 mg by nebulization 2 (two) times daily.    . cetirizine (ZYRTEC) 10 MG tablet Take 10 mg by mouth daily.    . divalproex (DEPAKOTE ER) 250 MG 24 hr tablet Take 1 tablet (250 mg total) by mouth daily. Take 1 tablet every night (Patient taking differently: Take 250 mg by mouth every evening. Take 1 tablet every night) 30 tablet 11  . escitalopram (LEXAPRO) 20 MG tablet Take 1 tablet (20 mg total) by mouth daily. 30 tablet 11  . hydrOXYzine (ATARAX/VISTARIL) 25 MG tablet Take 25 mg by mouth every 8 (eight) hours as needed for anxiety.     . Memantine HCl-Donepezil HCl (NAMZARIC) 28-10 MG  CP24 Take 1 capsule by mouth daily.    . nortriptyline (PAMELOR) 10 MG capsule Take 10 mg by mouth at bedtime as needed for sleep.    Marland Kitchen QUEtiapine (SEROQUEL) 50 MG tablet Take 50 mg by mouth at bedtime as needed (sleep).     . tamsulosin (FLOMAX) 0.4 MG CAPS capsule Take 0.4 mg by mouth daily.    . cyanocobalamin (,VITAMIN B-12,) 1000 MCG/ML injection Inject 1,000 mcg into the muscle every 30 (thirty) days.      No results found for this or any previous visit (from the past 48 hour(s)). No results found.  Review of Systems  All other systems reviewed and are negative.   Blood pressure 96/67, pulse 60, temperature 98.2 F (36.8 C), temperature source Oral, resp. rate 16, height 5\' 8"  (1.727 m), weight 85.7 kg, SpO2 100 %. Physical Exam  HENT:  Head: Normocephalic and atraumatic.  Cardiovascular: Normal rate and normal pulses.  Respiratory: Effort normal.  GI: Normal appearance.  Musculoskeletal:     Left hand: Deformity, tenderness and bony tenderness present.     Cervical back: Normal range of motion.  Neurological: He is alert.  Psychiatric: His behavior is normal. Mood, judgment and thought content normal.     Assessment/Plan As above   Have discussed reviosion amputation of above  Patient understands risks and benefits and wishes to proceed  Schuyler Amor, MD 05/10/2020, 11:59 AM

## 2020-05-10 NOTE — Telephone Encounter (Signed)
Surgery today. Will remove from pool.

## 2020-05-10 NOTE — Anesthesia Procedure Notes (Signed)
Procedure Name: MAC Date/Time: 05/10/2020 12:40 PM Performed by: Inda Coke, CRNA Pre-anesthesia Checklist: Patient identified, Emergency Drugs available, Suction available, Timeout performed and Patient being monitored Patient Re-evaluated:Patient Re-evaluated prior to induction Oxygen Delivery Method: Simple face mask Induction Type: IV induction Dental Injury: Teeth and Oropharynx as per pre-operative assessment

## 2020-05-10 NOTE — Brief Op Note (Signed)
05/10/2020  1:08 PM  PATIENT:  Jose Leonard  69 y.o. male  PRE-OPERATIVE DIAGNOSIS:  LEFT LONG FINGER CHRONIC NAIL DEFORMITY  POST-OPERATIVE DIAGNOSIS:  LEFT LONG FINGER CHRONIC NAIL DEFORMITY  PROCEDURE:  Procedure(s) with comments: LEFT LONG FINGER REVISION AMPUTATION (Left) - LOCAL, BLOCK, MAC  SURGEON:  Surgeon(s) and Role:    Charlotte Crumb, MD - Primary  PHYSICIAN ASSISTANT:   ASSISTANTS: none   ANESTHESIA:   regional  EBL:  5 mL   BLOOD ADMINISTERED:none  DRAINS: none   LOCAL MEDICATIONS USED:  NONE  SPECIMEN:  Biopsy / Limited Resection  DISPOSITION OF SPECIMEN:  PATHOLOGY  COUNTS:  YES  TOURNIQUET:   Total Tourniquet Time Documented: Upper Arm (Left) - 15 minutes Total: Upper Arm (Left) - 15 minutes   DICTATION: .Viviann Spare Dictation  PLAN OF CARE: Discharge to home after PACU  PATIENT DISPOSITION:  PACU - hemodynamically stable.   Delay start of Pharmacological VTE agent (>24hrs) due to surgical blood loss or risk of bleeding: not applicable

## 2020-05-10 NOTE — Progress Notes (Signed)
Orthopedic Tech Progress Note Patient Details:  Jose Leonard 06-16-1951 [id number] PACU RN called requesting an ARM SLING Ortho Devices Type of Ortho Device: Arm sling Ortho Device/Splint Location: LUE Ortho Device/Splint Interventions: Application, Adjustment   Post Interventions Patient Tolerated: Well Instructions Provided: Care of device   Jose Leonard 05/10/2020, 2:34 PM

## 2020-05-10 NOTE — Transfer of Care (Signed)
Immediate Anesthesia Transfer of Care Note  Patient: Jose Leonard  Procedure(s) Performed: LEFT LONG FINGER REVISION AMPUTATION (Left )  Patient Location: PACU  Anesthesia Type:MAC combined with regional for post-op pain  Level of Consciousness: awake and drowsy  Airway & Oxygen Therapy: Patient Spontanous Breathing and Patient connected to face mask oxygen  Post-op Assessment: Report given to RN and Post -op Vital signs reviewed and stable  Post vital signs: Reviewed and stable  Last Vitals:  Vitals Value Taken Time  BP 122/69 05/10/20 1314  Temp    Pulse 54 05/10/20 1319  Resp 19 05/10/20 1319  SpO2 100 % 05/10/20 1319  Vitals shown include unvalidated device data.  Last Pain:  Vitals:   05/10/20 1140  TempSrc:   PainSc: 0-No pain      Patients Stated Pain Goal: 3 (55/00/16 4290)  Complications: No complications documented.

## 2020-05-10 NOTE — Anesthesia Procedure Notes (Signed)
Anesthesia Regional Block: Axillary brachial plexus block   Pre-Anesthetic Checklist: ,, timeout performed, Correct Patient, Correct Site, Correct Laterality, Correct Procedure, Correct Position, site marked, Risks and benefits discussed,  Surgical consent,  Pre-op evaluation,  At surgeon's request and post-op pain management  Laterality: Left  Prep: chloraprep       Needles:  Injection technique: Single-shot  Needle Type: Echogenic Needle     Needle Length: 9cm  Needle Gauge: 21     Additional Needles:   Procedures:,,,, ultrasound used (permanent image in chart),,,,  Narrative:  Start time: 05/10/2020 11:36 AM End time: 05/10/2020 11:46 AM Injection made incrementally with aspirations every 5 mL.  Performed by: Personally  Anesthesiologist: Catalina Gravel, MD  Additional Notes: No pain on injection. No increased resistance to injection. Injection made in 5cc increments.  Good needle visualization.  Patient tolerated procedure well.

## 2020-05-12 NOTE — Anesthesia Postprocedure Evaluation (Signed)
Anesthesia Post Note  Patient: [name]  Procedure(s) Performed: LEFT LONG FINGER REVISION AMPUTATION (Left )     Patient location during evaluation: PACU Anesthesia Type: Regional Level of consciousness: awake and alert and oriented Pain management: pain level controlled Vital Signs Assessment: post-procedure vital signs reviewed and stable Respiratory status: spontaneous breathing, nonlabored ventilation and respiratory function stable Cardiovascular status: blood pressure returned to baseline Postop Assessment: no apparent nausea or vomiting Anesthetic complications: no   No complications documented.  Last Vitals:  Vitals:   05/10/20 1344 05/10/20 1346  BP: 121/72   Pulse: (!) 53 60  Resp: 19 17  Temp: (!) 36.4 C   SpO2: 98% 97%    Last Pain:  Vitals:   05/10/20 1345  TempSrc:   PainSc: 0-No pain                 Brennan Bailey

## 2020-05-13 ENCOUNTER — Encounter (HOSPITAL_COMMUNITY): Payer: Self-pay | Admitting: Orthopedic Surgery

## 2020-05-14 ENCOUNTER — Other Ambulatory Visit: Payer: Self-pay | Admitting: Physician Assistant

## 2020-05-16 LAB — SURGICAL PATHOLOGY

## 2020-05-31 ENCOUNTER — Other Ambulatory Visit: Payer: Self-pay

## 2020-05-31 DIAGNOSIS — E785 Hyperlipidemia, unspecified: Secondary | ICD-10-CM

## 2020-06-01 ENCOUNTER — Other Ambulatory Visit: Payer: Self-pay | Admitting: Neurology

## 2020-06-01 LAB — LIPID PANEL
Chol/HDL Ratio: 4.3 ratio (ref 0.0–5.0)
Cholesterol, Total: 176 mg/dL (ref 100–199)
HDL: 41 mg/dL (ref >39)
LDL Chol Calc (NIH): 120 mg/dL — ABNORMAL HIGH (ref 0–99)
Triglycerides: 79 mg/dL (ref 0–149)
VLDL Cholesterol Cal: 15 mg/dL (ref 5–40)

## 2020-06-01 LAB — HEPATIC FUNCTION PANEL
ALT: 11 IU/L (ref 0–44)
AST: 16 IU/L (ref 0–40)
Albumin: 4.1 g/dL (ref 3.7–4.7)
Alkaline Phosphatase: 89 IU/L (ref 48–121)
Bilirubin Total: 0.9 mg/dL (ref 0.0–1.2)
Bilirubin, Direct: 0.22 mg/dL (ref 0.00–0.40)
Total Protein: 7 g/dL (ref 6.0–8.5)

## 2020-06-03 ENCOUNTER — Ambulatory Visit (INDEPENDENT_AMBULATORY_CARE_PROVIDER_SITE_OTHER): Payer: Medicare Other | Admitting: Cardiovascular Disease

## 2020-06-03 ENCOUNTER — Other Ambulatory Visit: Payer: Self-pay

## 2020-06-03 DIAGNOSIS — R0683 Snoring: Secondary | ICD-10-CM

## 2020-06-03 DIAGNOSIS — F0280 Dementia in other diseases classified elsewhere without behavioral disturbance: Secondary | ICD-10-CM

## 2020-06-03 DIAGNOSIS — I25709 Atherosclerosis of coronary artery bypass graft(s), unspecified, with unspecified angina pectoris: Secondary | ICD-10-CM

## 2020-06-03 DIAGNOSIS — R0600 Dyspnea, unspecified: Secondary | ICD-10-CM

## 2020-06-03 DIAGNOSIS — E785 Hyperlipidemia, unspecified: Secondary | ICD-10-CM | POA: Diagnosis not present

## 2020-06-03 DIAGNOSIS — I10 Essential (primary) hypertension: Secondary | ICD-10-CM

## 2020-06-03 DIAGNOSIS — Z951 Presence of aortocoronary bypass graft: Secondary | ICD-10-CM

## 2020-06-03 DIAGNOSIS — G4733 Obstructive sleep apnea (adult) (pediatric): Secondary | ICD-10-CM

## 2020-06-03 DIAGNOSIS — G309 Alzheimer's disease, unspecified: Secondary | ICD-10-CM

## 2020-06-03 DIAGNOSIS — R0609 Other forms of dyspnea: Secondary | ICD-10-CM

## 2020-06-03 DIAGNOSIS — G4719 Other hypersomnia: Secondary | ICD-10-CM

## 2020-06-03 MED ORDER — ROSUVASTATIN CALCIUM 20 MG PO TABS
20.0000 mg | ORAL_TABLET | Freq: Every day | ORAL | 3 refills | Status: DC
Start: 2020-06-03 — End: 2021-06-17

## 2020-06-03 NOTE — Patient Instructions (Signed)
Medication Instructions:  BEGIN TAKING ROSUVASTATIN 20MG  DAILY  *If you need a refill on your cardiac medications before your next appointment, please call your pharmacy*   Lab Work: 2-3 MONTHS- CMET AND LIPID  If you have labs (blood work) drawn today and your tests are completely normal, you will receive your results only by: Marland Kitchen MyChart Message (if you have MyChart) OR . A paper copy in the mail If you have any lab test that is abnormal or we need to change your treatment, we will call you to review the results.   Testing/Procedures: Your physician has requested that you have a lexiscan myoview. For further information please visit [email or web address]. Please follow instruction sheet, as given.  [place] has recommended that you have a sleep study. This test records several body functions during sleep, including: brain activity, eye movement, oxygen and carbon dioxide blood levels, heart rate and rhythm, breathing rate and rhythm, the flow of air through your mouth and nose, snoring, body muscle movements, and chest and belly movement.     Follow-Up: At [facility], you and your health needs are our priority.  As part of our continuing mission to provide you with exceptional heart care, we have created designated Provider Care Teams.  These Care Teams include your primary Cardiologist (physician) and Advanced Practice Providers (APPs -  Physician Assistants and Nurse Practitioners) who all work together to provide you with the care you need, when you need it.  We recommend signing up for the patient portal called "MyChart".  Sign up information is provided on this After Visit Summary.  MyChart is used to connect with patients for Virtual Visits (Telemedicine).  Patients are able to view lab/test results, encounter notes, upcoming appointments, etc.  Non-urgent messages can be sent to your provider as well.   To learn more about what you can do with  MyChart, go to [email or web address].    Your next appointment:   2-3 month(s)  The format for your next appointment:   In Person  Provider:   Shelva Majestic, MD

## 2020-06-03 NOTE — Progress Notes (Signed)
Cardiology Office Note    Date:  06/10/2020   ID:  Jose Leonard, DOB 1951-07-20, MRN [id number]  PCP:  Buzzy Han, MD ([facility] primary care)  Cardiologist:  Shelva Majestic, MD   Reestablishment of care with me. I last saw the patient in May 2015.  History of Present Illness:  Jose Leonard is a 69 y.o. male who I last evaluated in May 2015.  Since that time, he has been seen by multiple APP's and most recently was evaluated by [name].PA in December 2020.  He presents for follow-up cardiology evaluation.  [name] has known CAD and underwent CABG revascularization in 2005 with a LIMA to his LAD, RIMA to the RCA, vein graft to diagonal, and vein graft to the marginal vessel. He has documented atresia of both arterial conduits. In January 2010 both vein grafts were patent. He apparently was not evaluated by cardiology and was lost to follow-up until 2017 when he experienced a syncopal episode. An echo Doppler study in May 2017 showed an EF of 55 to 60% with grade 1 diastolic this function and normal PA pressure at 26 mm. A heart monitor did not show any significant arrhythmia. Over the past several years he also has been treated by internal medicine for hypogonadism and by neurology for dementia. He saw [name] on November 02, 2019 and was having worsening dementia. He underwent a follow-up echo Doppler study in December 2020 which showed an EF of 60 to 65% with mild LVH.. In February 2021 he was evaluated by [name] of neurology. Neuropsychological testing in April 2020 was consistent with a diagnosis of Alzheimer's disease. An MRI of his brain in 2014 had shown moderate anterior and mesial temporal lobe atrophy.  Presently, he has been in difficult times. He lost his house and is now been living in hotel with his wife for 11 months. He is on oxygen supplement 2 L/min. He tested positive for Covid in January 2021 just prior to undergoing a sleep test. As result is he  never had a sleep study. He subsequently had Covid vaccination with his last vaccine approximately 2 months ago. He recently tested negative for Covid. He has experienced vague episodes of some nonexertional chest pain. Presently he admits to significant hypersomnolence. An Epworth Sleepiness Scale score was calculated in the office today and this endorsed at 21 as shown below:  Epworth Sleepiness Scale: Situation   Chance of Dozing/Sleeping (0 = never , 1 = slight chance , 2 = moderate chance , 3 = high chance )   sitting and reading 3   watching TV 3   sitting inactive in a public place 1   being a passenger in a motor vehicle for an hour or more 3   lying down in the afternoon 3   sitting and talking to someone 2   sitting quietly after lunch (no alcohol) 3   while stopped for a few minutes in traffic as the driver 3   Total Score  21    He presents for for reestablishment of care with me since his 2015 evaluation.   Past Medical History:  Diagnosis Date  . Anxiety state, unspecified   . Arthropathy, unspecified, site unspecified   . Asthma   . BPH (benign prostatic hyperplasia)   . CAD (coronary artery disease)    a. 8/20105 CABG x 4 (LIMA->LAD, RIMA->RCA, VG->Diag, VG->OM); b. 11/2008 Cath: patent VG's with atretic LIMA/RIMA. RCA 50; c. 12/2011 MV:  EF 62%, no ischemia.  Marland Kitchen COPD (chronic obstructive pulmonary disease) ([place])   . Dementia ([place])   . Depression   . Diverticulosis   . Dyspnea    Sats drop when he is not wearing oxygen  . Family history of other neurological diseases   . Gallstones   . GI bleed 09/2019  . Hiatal hernia   . Hyperlipidemia   . Hypertensive heart disease    a. 07/2005 Echo: EF nl, mildly dil LA/RA, mild TR/AI.  Marland Kitchen Memory loss   . Myocardial infarction ([place])   . Presyncope and Syncope    a. Syncopal spell @ home 01/2016.  . Schatzki's ring   . Tubular adenoma of colon     Past Surgical History:  Procedure Laterality Date  . AMPUTATION Left  05/10/2020   Procedure: LEFT LONG FINGER REVISION AMPUTATION;  Surgeon: Charlotte Crumb, MD;  Location: [facility];  Service: Orthopedics;  Laterality: Left;  LOCAL, BLOCK, MAC  . ANGIOPLASTY    . CARDIAC CATHETERIZATION  08/22/2004  . CARDIAC CATHETERIZATION  12/17/2008  . COLONOSCOPY    . CORONARY ARTERY BYPASS GRAFT  2005   x3  . ESOPHAGOGASTRODUODENOSCOPY    . ROTATOR CUFF REPAIR Bilateral   . ROTATOR CUFF REPAIR  [id number]  . ROTATOR CUFF REPAIR  01/2003    Current Medications: Outpatient Medications Prior to Visit  Medication Sig Dispense Refill  . acetaminophen (TYLENOL) 500 MG tablet Take 1,000 mg by mouth every 8 (eight) hours as needed for mild pain or headache.    . albuterol (PROVENTIL) (2.5 MG/3ML) 0.083% nebulizer solution Take 2.5 mg by nebulization every 6 (six) hours as needed for wheezing or shortness of breath.    Marland Kitchen aspirin EC 81 MG tablet Take 1 tablet (81 mg total) by mouth daily.    . budesonide (PULMICORT) 0.25 MG/2ML nebulizer solution Take 0.25 mg by nebulization 2 (two) times daily.    . cetirizine (ZYRTEC) 10 MG tablet Take 10 mg by mouth daily.    . cyanocobalamin (,VITAMIN B-12,) 1000 MCG/ML injection Inject 1,000 mcg into the muscle every 30 (thirty) days.    . divalproex (DEPAKOTE ER) 250 MG 24 hr tablet Take 1 tablet (250 mg total) by mouth daily. Take 1 tablet every night (Patient taking differently: Take 250 mg by mouth every evening. Take 1 tablet every night) 30 tablet 11  . escitalopram (LEXAPRO) 20 MG tablet TAKE 1 TABLET BY MOUTH EVERY DAY 90 tablet 0  . hydrOXYzine (ATARAX/VISTARIL) 25 MG tablet Take 25 mg by mouth every 8 (eight) hours as needed for anxiety.     . Memantine HCl-Donepezil HCl (NAMZARIC) 28-10 MG CP24 Take 1 capsule by mouth daily.    . nortriptyline (PAMELOR) 10 MG capsule Take 10 mg by mouth at bedtime as needed for sleep.    Marland Kitchen QUEtiapine (SEROQUEL) 50 MG tablet Take 50 mg by mouth at bedtime as needed (sleep).     . tamsulosin  (FLOMAX) 0.4 MG CAPS capsule Take 0.4 mg by mouth daily.     No facility-administered medications prior to visit.     Allergies:   Tape and Penicillins   Social History   Socioeconomic History  . Marital status: Married    Spouse name: Not on file  . Number of children: 2  . Years of education: Not on file  . Highest education level: Not on file  Occupational History  . Occupation: disabled  Tobacco Use  . Smoking status: Former Smoker  Years: 30.00    Quit date: 06/28/1983    Years since quitting: 36.9  . Smokeless tobacco: Never Used  Vaping Use  . Vaping Use: Never used  Substance and Sexual Activity  . Alcohol use: Not Currently    Alcohol/week: 1.0 standard drink    Types: 1 Standard drinks or equivalent per week    Comment: occ  . Drug use: No  . Sexual activity: Not on file  Other Topics Concern  . Not on file  Social History Narrative  . Not on file   Social Determinants of Health   Financial Resource Strain:   . Difficulty of Paying Living Expenses:   Food Insecurity:   . Worried About Charity fundraiser in the Last Year:   . Arboriculturist in the Last Year:   Transportation Needs:   . Film/video editor (Medical):   Marland Kitchen Lack of Transportation (Non-Medical):   Physical Activity:   . Days of Exercise per Week:   . Minutes of Exercise per Session:   Stress:   . Feeling of Stress :   Social Connections:   . Frequency of Communication with Friends and Family:   . Frequency of Social Gatherings with Friends and Family:   . Attends Religious Services:   . Active Member of Clubs or Organizations:   . Attends Archivist Meetings:   Marland Kitchen Marital Status:      Family History:  The patient's family history includes Arthritis in his brother, maternal grandfather, mother, and sister; Asthma in his brother and mother; Cancer in his daughter and paternal grandfather; Colon cancer in his mother; Coronary artery disease in his father; Dementia in his  father and mother; Diabetes in his father and maternal grandfather; Heart attack in his mother; Parkinson's disease in his brother and maternal uncle; Stroke in his father and maternal grandmother.   ROS General: Negative; No fevers, chills, or night sweats;  HEENT: Negative; No changes in vision or hearing, sinus congestion, difficulty swallowing Pulmonary: Negative; No cough, wheezing, shortness of breath, hemoptysis Cardiovascular: Negative; No chest pain, presyncope, syncope, palpitations GI: Negative; No nausea, vomiting, diarrhea, or abdominal pain GU: Negative; No dysuria, hematuria, or difficulty voiding Musculoskeletal: Negative; no myalgias, joint pain, or weakness Hematologic/Oncology: Negative; no easy bruising, bleeding Endocrine: Negative; no heat/cold intolerance; no diabetes Neuro: Negative; no changes in balance, headaches Skin: Negative; No rashes or skin lesions Psychiatric: Negative; No behavioral problems, depression Sleep: Negative; No snoring, daytime sleepiness, hypersomnolence, bruxism, restless legs, hypnogognic hallucinations, no cataplexy Other comprehensive 14 point system review is negative.   PHYSICAL EXAM:   VS:  BP 122/78   Pulse 61   Temp (!) 97.5 F (36.4 C)   Ht _0  (1.727 m)   Wt 200 lb 9.6 oz (91 kg)   SpO2 97%   BMI 30.50 kg/m     Repeat blood pressure by me was 124/76  Wt Readings from Last 3 Encounters:  06/03/20 200 lb 9.6 oz (91 kg)  05/10/20 188 lb 15 oz (85.7 kg)  01/04/20 189 lb (85.7 kg)    General: Alert, oriented, no distress.  Skin: normal turgor, no rashes, warm and dry HEENT: Normocephalic, atraumatic. Pupils equal round and reactive to light; sclera anicteric; extraocular muscles intact;  Nose without nasal septal hypertrophy Mouth/Parynx benign; Mallinpatti scale 3 Neck: No JVD, no carotid bruits; normal carotid upstroke Lungs: clear to ausculatation and percussion; no wheezing or rales Chest wall: without tenderness  to palpitation Heart:  PMI not displaced, RRR, s1 s2 normal, 1/6 systolic murmur, no diastolic murmur, no rubs, gallops, thrills, or heaves Abdomen: soft, nontender; no hepatosplenomehaly, BS+; abdominal aorta nontender and not dilated by palpation. Back: no CVA tenderness Pulses 2+ Musculoskeletal: full range of motion, normal strength, no joint deformities Extremities: no clubbing cyanosis or edema, Homan's sign negative  Neurologic: Flat affect   Studies/Labs Reviewed:   EKG:  EKG is ordered today.  ECG (independently read by me): Normal sinus rhythm at 61 bpm.  QS complex V2 and V3  Recent Labs: BMP Latest Ref Rng & Units 05/10/2020 09/30/2019 09/29/2019  Glucose 70 - 99 mg/dL 93 104(H) 102(H)  BUN 8 - 23 mg/dL 7(L) <5(L) 6(L)  Creatinine 0.61 - 1.24 mg/dL 1.05 0.98 1.00  BUN/Creat Ratio 10 - 22 - - -  Sodium 135 - 145 mmol/L 140 137 136  Potassium 3.5 - 5.1 mmol/L 3.9 4.1 3.4(L)  Chloride 98 - 111 mmol/L 107 107 104  CO2 22 - 32 mmol/L _0 Calcium 8.9 - 10.3 mg/dL 8.9 8.8(L) 8.4(L)     Hepatic Function Latest Ref Rng & Units 05/31/2020 09/29/2019 09/28/2019  Total Protein 6.0 - 8.5 g/dL 7.0 5.3(L) 5.8(L)  Albumin 3.7 - 4.7 g/dL 4.1 2.9(L) 3.3(L)  AST 0 - 40 IU/L 16 14(L) 16  ALT 0 - 44 IU/L _1 Alk Phosphatase 48 - 121 IU/L 89 48 60  Total Bilirubin 0.0 - 1.2 mg/dL 0.9 1.3(H) 0.9  Bilirubin, Direct 0.00 - 0.40 mg/dL 0.22 - -    CBC Latest Ref Rng & Units 05/10/2020 09/30/2019 09/29/2019  WBC 4.0 - 10.5 K/uL 6.5 6.2 5.5  Hemoglobin 13.0 - 17.0 g/dL 14.1 11.4(L) 12.2(L)  Hematocrit 39 - 52 % 44.7 34.3(L) 36.2(L)  Platelets 150 - 400 K/uL 244 198 190   Lab Results  Component Value Date   MCV 93.3 05/10/2020   MCV 91.5 09/30/2019   MCV 92.1 09/29/2019   Lab Results  Component Value Date   TSH 3.23 03/12/2016   Lab Results  Component Value Date   HGBA1C 5.9 (H) 06/27/2013     BNP No results found for: BNP  ProBNP    Component Value Date/Time   PROBNP  52.0 02/15/2009 1550     Lipid Panel     Component Value Date/Time   CHOL 176 05/31/2020 1207   TRIG 79 05/31/2020 1207   HDL 41 05/31/2020 1207   CHOLHDL 4.3 05/31/2020 1207   CHOLHDL 3.7 03/12/2016 1103   VLDL 23 03/12/2016 1103   LDLCALC 120 (H) 05/31/2020 1207   LABVLDL 15 05/31/2020 1207     RADIOLOGY: No results found.   Additional studies/ records that were reviewed today include:  I reviewed the patient's prior records. Cardiac monitor from June 2017 was reviewed which did not reveal any significant arrhythmias on monitoring. His most recent echo Doppler study from November 14, 2019 was reviewed which showed EF 60 to 65%, normal atrial size. No wall motion abnormalities. No significant valvular pathology. Neurologic evaluation was reviewed.  ASSESSMENT:    1. Coronary artery disease involving coronary bypass graft of native heart with angina pectoris ([place])   2. Hx of CABG   3. Hyperlipidemia with target LDL less than 70   4. Essential hypertension   5. Dyspnea on exertion   6. Evaluate for OSA (obstructive sleep apnea)   7. Snoring   8. Excessive daytime sleepiness   9. Alzheimer's dementia without behavioral disturbance, unspecified  timing of dementia onset [facility])     PLAN:  Mr. Tevis Dunavan is a 69 year old gentleman who underwent CABG revascularization surgery x4 in August 2005 with a LIMA to LAD, RIMA to RCA, SVG to diagonal and SVG to OM vessel. Subsequent catheterization, however, has revealed atresia of both arterial conduits. He has a history of hypertension, hyperlipidemia, COPD, as well as syncope. He has developed progressive dementia felt to be Alzheimer's type and has been on Namzaric. He was recently started on Depakote ER to 50 mg for mood stabilization as well as increased Lexapro daily. He has significant daytime sleepiness and an Epworth Sleepiness Scale score calculated at 21 consistent with significant hypersomnolence. Was to undergo a sleep study  earlier this year but due to testing positive for Covid on December 15, 2019 this was not performed. I have recommended that he pursue with the sleep study evaluation and I suspect he does have significant sleep apnea. He is now 16 years following his CABG revascularization. He has not had any recent ischemic evaluation and I have recommended he undergo a [place] study to assess scar/ischemia. He also has been demonstrated to have hyperlipidemia and review of laboratory from July 2021 shows an LDL cholesterol at 120. I am recommending the addition of rosuvastatin 20 mg to his medical regimen. Target LDL is less than 70.   Medication Adjustments/Labs and Tests Ordered: Current medicines are reviewed at length with the patient today.  Concerns regarding medicines are outlined above.  Medication changes, Labs and Tests ordered today are listed in the Patient Instructions below. Patient Instructions  Medication Instructions:  BEGIN TAKING ROSUVASTATIN 20MG DAILY  *If you need a refill on your cardiac medications before your next appointment, please call your pharmacy*   Lab Work: 2-3 MONTHS- CMET AND LIPID  If you have labs (blood work) drawn today and your tests are completely normal, you will receive your results only by: Marland Kitchen MyChart Message (if you have MyChart) OR . A paper copy in the mail If you have any lab test that is abnormal or we need to change your treatment, we will call you to review the results.   Testing/Procedures: Your physician has requested that you have a lexiscan myoview. For further information please visit [email or web address]. Please follow instruction sheet, as given.  [place] has recommended that you have a sleep study. This test records several body functions during sleep, including: brain activity, eye movement, oxygen and carbon dioxide blood levels, heart rate and rhythm, breathing rate and rhythm, the flow of air through your  mouth and nose, snoring, body muscle movements, and chest and belly movement.     Follow-Up: At [facility], you and your health needs are our priority.  As part of our continuing mission to provide you with exceptional heart care, we have created designated Provider Care Teams.  These Care Teams include your primary Cardiologist (physician) and Advanced Practice Providers (APPs -  Physician Assistants and Nurse Practitioners) who all work together to provide you with the care you need, when you need it.  We recommend signing up for the patient portal called "MyChart".  Sign up information is provided on this After Visit Summary.  MyChart is used to connect with patients for Virtual Visits (Telemedicine).  Patients are able to view lab/test results, encounter notes, upcoming appointments, etc.  Non-urgent messages can be sent to your provider as well.   To learn more about what you can do with  MyChart, go to [email or web address].    Your next appointment:   2-3 month(s)  The format for your next appointment:   In Person  Provider:   Shelva Majestic, MD        Signed, Shelva Majestic, MD  06/10/2020 8:11 PM    [place] [address] [place], [place], [place]  [address] Phone: [phone]

## 2020-06-04 ENCOUNTER — Telehealth: Payer: Self-pay | Admitting: Cardiovascular Disease

## 2020-06-04 NOTE — Telephone Encounter (Signed)
Crystal from Dr. Darlina Sicilian office called and wanted to know if they could get a copy of the lab work done on the patient on 06/03/20. Fax to [phone]; Attn: Crystal

## 2020-06-04 NOTE — Telephone Encounter (Signed)
Called back - Jose Leonard unable to come to the phone . Spoke to Dell  RN informed that patient did not have labs done on 06/03/20. These labs - CMP,LIPID.-- will not be done for another 2- 69months.   RN  Informed Jose Leonard that the lab at [facility] is free standing . If they give patient labslip the labwork can be done.  she verbalized understanding.  she states the primary wanted some other testing.

## 2020-06-07 ENCOUNTER — Telehealth (HOSPITAL_COMMUNITY): Payer: Self-pay

## 2020-06-07 NOTE — Telephone Encounter (Signed)
Encounter complete. 

## 2020-06-10 ENCOUNTER — Encounter: Payer: Self-pay | Admitting: Cardiovascular Disease

## 2020-06-12 ENCOUNTER — Ambulatory Visit (HOSPITAL_COMMUNITY)
Admission: RE | Admit: 2020-06-12 | Payer: Medicare Other | Source: Ambulatory Visit | Attending: Cardiovascular Disease | Admitting: Cardiovascular Disease

## 2020-06-13 ENCOUNTER — Telehealth (HOSPITAL_COMMUNITY): Payer: Self-pay

## 2020-06-13 NOTE — Telephone Encounter (Signed)
Encounter complete. 

## 2020-06-19 ENCOUNTER — Other Ambulatory Visit: Payer: Self-pay

## 2020-06-19 ENCOUNTER — Ambulatory Visit (HOSPITAL_COMMUNITY)
Admission: RE | Admit: 2020-06-19 | Discharge: 2020-06-19 | Disposition: A | Discharge status: Home / Self Care | Payer: Medicare Other | Source: Ambulatory Visit | Attending: Cardiology | Admitting: Cardiology

## 2020-06-19 DIAGNOSIS — R0600 Dyspnea, unspecified: Secondary | ICD-10-CM | POA: Insufficient documentation

## 2020-06-19 DIAGNOSIS — I25709 Atherosclerosis of coronary artery bypass graft(s), unspecified, with unspecified angina pectoris: Secondary | ICD-10-CM | POA: Diagnosis not present

## 2020-06-19 DIAGNOSIS — R0609 Other forms of dyspnea: Secondary | ICD-10-CM

## 2020-06-19 DIAGNOSIS — R0683 Snoring: Secondary | ICD-10-CM | POA: Insufficient documentation

## 2020-06-19 DIAGNOSIS — E785 Hyperlipidemia, unspecified: Secondary | ICD-10-CM | POA: Insufficient documentation

## 2020-06-19 LAB — MYOCARDIAL PERFUSION IMAGING
LV dias vol: 122 mL (ref 62–150)
LV sys vol: 63 mL
Peak HR: 81 {beats}/min
Rest HR: 59 {beats}/min
SDS: 0
SRS: 5
SSS: 5
TID: 1.29

## 2020-06-19 MED ORDER — REGADENOSON 0.4 MG/5ML IV SOLN
0.4000 mg | Freq: Once | INTRAVENOUS | Status: AC
Start: 2020-06-19 — End: 2020-06-19
  Administered 2020-06-19: 0.4 mg via INTRAVENOUS

## 2020-06-19 MED ORDER — TECHNETIUM TC 99M TETROFOSMIN IV KIT
10.2000 | PACK | Freq: Once | INTRAVENOUS | Status: AC | PRN
  Administered 2020-06-19: 10.2 via INTRAVENOUS
  Filled 2020-06-19: qty 11

## 2020-06-19 MED ORDER — TECHNETIUM TC 99M TETROFOSMIN IV KIT
31.8000 | PACK | Freq: Once | INTRAVENOUS | Status: AC | PRN
  Administered 2020-06-19: 31.8 via INTRAVENOUS
  Filled 2020-06-19: qty 32

## 2020-07-04 ENCOUNTER — Institutional Professional Consult (permissible substitution): Payer: Medicare Other | Admitting: Pulmonary Disease

## 2020-07-05 ENCOUNTER — Encounter: Payer: Self-pay | Admitting: Pulmonary Disease

## 2020-07-05 ENCOUNTER — Other Ambulatory Visit: Payer: Self-pay

## 2020-07-05 ENCOUNTER — Ambulatory Visit: Payer: Medicare Other | Admitting: Pulmonary Disease

## 2020-07-05 VITALS — BP 124/68 | HR 71 | Temp 97.6°F | Ht 67.0 in | Wt 196.6 lb

## 2020-07-05 DIAGNOSIS — J449 Chronic obstructive pulmonary disease, unspecified: Secondary | ICD-10-CM

## 2020-07-05 MED ORDER — FORMOTEROL FUMARATE 20 MCG/2ML IN NEBU: 20.0000 ug | INHALATION_SOLUTION | Freq: Two times a day (BID) | RESPIRATORY_TRACT | 11 refills | Status: DC

## 2020-07-05 NOTE — Patient Instructions (Addendum)
Nice to meet you.  Mix budesonide with formoterol neb twice a day.   We will get PFTs soon  Back in 3 months.

## 2020-07-05 NOTE — Progress Notes (Signed)
@Patient  ID: Jose Leonard, male    DOB: Apr 03, 1951, 69 y.o.   MRN: [id number]  Chief Complaint  Patient presents with  . Consult    referred for COPD, using O2 2L, Started O2 2020, non productive cough, DOE    Referring provider: Einar Pheasant, DO  HPI:  Jose Leonard is a 69 year old man with reported history of COPD (no PFTs available for review) and chronic hypoxic respiratory failure on 2 L oxygen starting 2020 and significant dementia whom are seeing in consultation at the request of Doreatha Lew, DO for evaluation of cough and dyspnea on exertion.  Notes from referring provider reviewed.  Patient is unable to provide reliable history.  History largely gathered from wife in the room.  Gradual onset of worsening dyspnea on exertion the last several months to year or 2.  Severity described as moderate.  Worsening as above.  Notices mainly after going for walks he is breathing heavily and needs to rest on the bed for some time before recovering.  Symptoms appear to improve with rest.  Has used albuterol nebulizer and budesonide nebulizer.  No clear improvement with these interventions.  This seems to be more severe than prior.  In addition, he has a daily cough.  Productive of phlegm. Present for years.   Chest x-ray results 10/2019 reviewed and read as normal.  Chest x-ray 2012 reviewed and on my interpretation clear, no evidence of hyperinflation.  PMH: CAD, COPD, Surgical history: CABG Family history - Reviewed, none Social History: 20-30 pack year history, quit 1985, retired, no alcohol Maintenance:   Pt of:   07/05/2020  - Visit     Questionaires / Pulmonary Flowsheets:   ACT:  No flowsheet data found.  MMRC: No flowsheet data found.  Epworth:  No flowsheet data found.  Tests:   FENO:  No results found for: NITRICOXIDE  PFT: No flowsheet data found.  WALK:  No flowsheet data found.  Imaging: MYOCARDIAL PERFUSION IMAGING  Result Date: 06/19/2020   The left ventricular ejection fraction is mildly decreased (45-54%).  Nuclear stress EF is calculated at 48% but visually appears to be at least 55%.  There was no ST segment deviation noted during stress. Baseline EKG showed atrial fibrillation.  There is a medium defect of moderate severity present in the basal inferior, mid inferolateral, apical inferior and apical lateral location. The defect is non-reversible. In setting of normal LVF, this is consistent with diaphragmatic attenuation artifact and increased extracardiac uptake. No ischemia.  This is a low risk study.     Lab Results: Reviewed as per EMR. CBC    Component Value Date/Time   WBC 6.5 05/10/2020 1200   RBC 4.79 05/10/2020 1200   HGB 14.1 05/10/2020 1200   HCT 44.7 05/10/2020 1200   PLT 244 05/10/2020 1200   MCV 93.3 05/10/2020 1200   MCH 29.4 05/10/2020 1200   MCHC 31.5 05/10/2020 1200   RDW 13.2 05/10/2020 1200   LYMPHSABS 2.1 09/28/2019 0152   MONOABS 0.8 09/28/2019 0152   EOSABS 0.3 09/28/2019 0152   BASOSABS 0.1 09/28/2019 0152    BMET    Component Value Date/Time   NA 140 05/10/2020 1200   NA 140 06/27/2013 1118   K 3.9 05/10/2020 1200   CL 107 05/10/2020 1200   CO2 24 05/10/2020 1200   GLUCOSE 93 05/10/2020 1200   BUN 7 (L) 05/10/2020 1200   BUN 12 06/27/2013 1118   CREATININE 1.05 05/10/2020 1200   CREATININE  0.91 03/12/2016 1103   CALCIUM 8.9 05/10/2020 1200   GFRNONAA >60 05/10/2020 1200   GFRAA >60 05/10/2020 1200    BNP No results found for: BNP  ProBNP    Component Value Date/Time   PROBNP 52.0 02/15/2009 1550    Specialty Problems      Pulmonary Problems   ASTHMATIC BRONCHITIS, ACUTE    Qualifier: Diagnosis of  By: Talbert Cage CMA (AAMA), June        COUGH    Qualifier: Diagnosis of  By: Chase Caller MD, Adrienne Mocha OF BREATH (SOB)    Qualifier: Diagnosis of  By: Chase Caller MD, Murali        Pharyngeal dysphagia      Allergies  Allergen Reactions  .  Tape Other (See Comments)    Skin turns red and draws blood   . Penicillins Other (See Comments)    Did it involve swelling of the face/tongue/throat, SOB, or low BP? Unknown Did it involve sudden or severe rash/hives, skin peeling, or any reaction on the inside of your mouth or nose? Unknown Did you need to seek medical attention at a hospital or doctor's office? Unknown When did it last happen? If all above answers are "NO", may proceed with cephalosporin use.  Childhood allergy    Immunization History  Administered Date(s) Administered  . Fluad Quad(high Dose 71+) 07/30/2019  . Influenza Split 08/17/2016  . Influenza, High Dose Seasonal PF 07/07/2017  . PFIZER SARS-COV-2 Vaccination 02/01/2020, 02/26/2020  . Zoster Recombinat (Shingrix) 07/30/2019    Past Medical History:  Diagnosis Date  . Anxiety state, unspecified   . Arthropathy, unspecified, site unspecified   . Asthma   . BPH (benign prostatic hyperplasia)   . CAD (coronary artery disease)    a. 8/20105 CABG x 4 (LIMA->LAD, RIMA->RCA, VG->Diag, VG->OM); b. 11/2008 Cath: patent VG's with atretic LIMA/RIMA. RCA 50; c. 12/2011 MV: EF 62%, no ischemia.  Marland Kitchen COPD (chronic obstructive pulmonary disease) ([place])   . Dementia ([place])   . Depression   . Diverticulosis   . Dyspnea    Sats drop when he is not wearing oxygen  . Family history of other neurological diseases   . Gallstones   . GI bleed 09/2019  . Hiatal hernia   . Hyperlipidemia   . Hypertensive heart disease    a. 07/2005 Echo: EF nl, mildly dil LA/RA, mild TR/AI.  Marland Kitchen Memory loss   . Myocardial infarction ([place])   . Presyncope and Syncope    a. Syncopal spell @ home 01/2016.  . Schatzki's ring   . Tubular adenoma of colon     Tobacco History: Social History   Tobacco Use  Smoking Status Former Smoker  . Years: 30.00  . Quit date: 06/28/1983  . Years since quitting: 37.0  Smokeless Tobacco Never Used   Counseling given: Not Answered   Continue to not  smoke  Outpatient Encounter Medications as of 07/05/2020  Medication Sig  . acetaminophen (TYLENOL) 500 MG tablet Take 1,000 mg by mouth every 8 (eight) hours as needed for mild pain or headache.  . albuterol (PROVENTIL) (2.5 MG/3ML) 0.083% nebulizer solution Take 2.5 mg by nebulization every 6 (six) hours as needed for wheezing or shortness of breath.  Marland Kitchen aspirin EC 81 MG tablet Take 1 tablet (81 mg total) by mouth daily.  . budesonide (PULMICORT) 0.25 MG/2ML nebulizer solution Take 0.25 mg by nebulization 2 (two) times daily.  . cetirizine (ZYRTEC) 10 MG tablet  Take 10 mg by mouth daily.  . cyanocobalamin (,VITAMIN B-12,) 1000 MCG/ML injection Inject 1,000 mcg into the muscle every 30 (thirty) days.  . divalproex (DEPAKOTE ER) 250 MG 24 hr tablet Take 1 tablet (250 mg total) by mouth daily. Take 1 tablet every night (Patient taking differently: Take 250 mg by mouth every evening. Take 1 tablet every night)  . escitalopram (LEXAPRO) 20 MG tablet TAKE 1 TABLET BY MOUTH EVERY DAY  . hydrOXYzine (ATARAX/VISTARIL) 25 MG tablet Take 25 mg by mouth every 8 (eight) hours as needed for anxiety.   . Memantine HCl-Donepezil HCl (NAMZARIC) 28-10 MG CP24 Take 1 capsule by mouth daily.  . nortriptyline (PAMELOR) 10 MG capsule Take 10 mg by mouth at bedtime as needed for sleep.  Marland Kitchen QUEtiapine (SEROQUEL) 50 MG tablet Take 50 mg by mouth at bedtime as needed (sleep).   . rosuvastatin (CRESTOR) 20 MG tablet Take 1 tablet (20 mg total) by mouth daily.  . tamsulosin (FLOMAX) 0.4 MG CAPS capsule Take 0.4 mg by mouth daily.  . formoterol (PERFOROMIST) 20 MCG/2ML nebulizer solution Take 2 mLs (20 mcg total) by nebulization 2 (two) times daily.   No facility-administered encounter medications on file as of 07/05/2020.     Review of Systems  Review of Systems  Unable to obtain due to patient factors.  Per wife, no chest pain or orthopnea.  Otherwise comprehensive review of systems as obtained from wife otherwise  negative. Physical Exam  BP 124/68 (BP Location: Right Arm, Cuff Size: Normal)   Pulse 71   Temp 97.6 F (36.4 C) (Temporal)   Ht 5\' 7"  (1.702 m)   Wt 196 lb 9.6 oz (89.2 kg)   SpO2 98%   BMI 30.79 kg/m   Wt Readings from Last 5 Encounters:  07/05/20 196 lb 9.6 oz (89.2 kg)  06/19/20 200 lb (90.7 kg)  06/03/20 200 lb 9.6 oz (91 kg)  05/10/20 188 lb 15 oz (85.7 kg)  01/04/20 189 lb (85.7 kg)    BMI Readings from Last 5 Encounters:  07/05/20 30.79 kg/m  06/19/20 30.41 kg/m  06/03/20 30.50 kg/m  05/10/20 28.73 kg/m  01/04/20 28.74 kg/m     Physical Exam General: Well-appearing, no acute distress Neck: No JVP sitting upright, supple  eyes: EOMI, no icterus Respiratory: Clear to auscultation bilaterally, diminished breath sounds, normal work of breathing on 2 L nasal cannula Cardiovascular: Regular rate and rhythm, no murmurs Abdomen: Soft nontender, bowel sounds present MSK: No joint effusions, no synovitis Neuro: Normal gait,  no weakness Psych: Disoriented, pleasant, occasionally responds appropriately but often does not respond appropriately to questions   Assessment & Plan:   Jose Leonard is a 69 year old man with reported history of COPD (no PFTs available for review) and chronic hypoxic respiratory failure on 2 L oxygen starting 2020 and significant dementia whom are seeing in consultation at the request of Doreatha Lew, DO for evaluation of cough and dyspnea on exertion.  Per wife, symptoms progressive over time.  Significant shortness of breath and needing to rest after walks.  Prior cigarette smoke do suspect this is related to COPD.  Unfortunately he is unable to comply with PFTs in a manner that makes them interpretable.  Unable to appropriately use inhalers.  Currently on nebulized budesonide.  Advised to continue this and will add formoterol for LABA in effort to improve his breathing.  Discussed at length the goal is to help his quality of life and not  burden him with unnecessary  or ineffective interventions.  If additional medication is not helpful in terms of symptoms, anticipate discontinuing.  Wife is in agreement with overall plan to treat symptoms as effectively as possible.   Return in about 3 months (around 10/05/2020).   Lanier Clam, MD 07/05/2020

## 2020-07-08 ENCOUNTER — Telehealth: Payer: Self-pay | Admitting: Cardiovascular Disease

## 2020-07-08 ENCOUNTER — Telehealth: Payer: Self-pay | Admitting: Pulmonary Disease

## 2020-07-08 DIAGNOSIS — J449 Chronic obstructive pulmonary disease, unspecified: Secondary | ICD-10-CM

## 2020-07-08 MED ORDER — FORMOTEROL FUMARATE 20 MCG/2ML IN NEBU: 20.0000 ug | INHALATION_SOLUTION | Freq: Two times a day (BID) | RESPIRATORY_TRACT | 5 refills | Status: DC

## 2020-07-08 NOTE — Telephone Encounter (Signed)
Attempt to return call-continues to ring without answer or VM.  Will need to attempt

## 2020-07-08 NOTE — Telephone Encounter (Signed)
Prescription for Perforomist sent to [facility]. Patient reports cost of 40.00 at [facility]. Sent to preferred pharmacy.

## 2020-07-08 NOTE — Telephone Encounter (Signed)
Patient's wife is requesting to discuss results from stress test completed on 06/19/20. Please call.

## 2020-07-08 NOTE — Telephone Encounter (Signed)
error 

## 2020-07-09 NOTE — Telephone Encounter (Signed)
Spoke with pt wife, she reports they were told to call us because the nuclear stress test showed the patient was in atrial fib. The patient does not have a history of atrial fib. She reports with the patients dementia he does not really say anything about palpitations or fluttering. She reports after walking he will have to lie down for awhile but she is not sure why. Will forward for Dr Claiborne Billings to review and advise.

## 2020-07-11 ENCOUNTER — Other Ambulatory Visit: Payer: Self-pay

## 2020-07-12 ENCOUNTER — Encounter: Payer: Self-pay | Admitting: Pulmonary Disease

## 2020-07-15 NOTE — Telephone Encounter (Signed)
The patient was in sinus rhythm on his ECG on June 03, 2020.  Follow-up with ECG in office to reassess.  Some tracings suggest sinus rhythm but other tracings have baseline wander in the setting of bradycardia which may have made it appeared to be atrial fibrillation

## 2020-07-18 NOTE — Telephone Encounter (Signed)
Spoke with pt wife, aware of dr Evette Georges response. While talking with the wife the phone disconnected.

## 2020-07-22 ENCOUNTER — Encounter (HOSPITAL_COMMUNITY): Payer: Medicare Other

## 2020-07-31 ENCOUNTER — Ambulatory Visit: Payer: Medicare Other | Admitting: Neurology

## 2020-09-09 ENCOUNTER — Other Ambulatory Visit: Payer: Self-pay | Admitting: Neurology

## 2020-10-09 ENCOUNTER — Encounter: Payer: Self-pay | Admitting: Cardiovascular Disease

## 2020-10-09 ENCOUNTER — Other Ambulatory Visit: Payer: Self-pay

## 2020-10-09 ENCOUNTER — Ambulatory Visit (INDEPENDENT_AMBULATORY_CARE_PROVIDER_SITE_OTHER): Payer: Medicare Other | Admitting: Cardiovascular Disease

## 2020-10-09 DIAGNOSIS — G4733 Obstructive sleep apnea (adult) (pediatric): Secondary | ICD-10-CM

## 2020-10-09 DIAGNOSIS — Z79899 Other long term (current) drug therapy: Secondary | ICD-10-CM

## 2020-10-09 DIAGNOSIS — F0280 Dementia in other diseases classified elsewhere without behavioral disturbance: Secondary | ICD-10-CM

## 2020-10-09 DIAGNOSIS — I25709 Atherosclerosis of coronary artery bypass graft(s), unspecified, with unspecified angina pectoris: Secondary | ICD-10-CM

## 2020-10-09 DIAGNOSIS — E785 Hyperlipidemia, unspecified: Secondary | ICD-10-CM | POA: Diagnosis not present

## 2020-10-09 DIAGNOSIS — R0683 Snoring: Secondary | ICD-10-CM

## 2020-10-09 DIAGNOSIS — G309 Alzheimer's disease, unspecified: Secondary | ICD-10-CM

## 2020-10-09 DIAGNOSIS — Z951 Presence of aortocoronary bypass graft: Secondary | ICD-10-CM

## 2020-10-09 DIAGNOSIS — I10 Essential (primary) hypertension: Secondary | ICD-10-CM

## 2020-10-09 DIAGNOSIS — G4719 Other hypersomnia: Secondary | ICD-10-CM

## 2020-10-09 LAB — COMPREHENSIVE METABOLIC PANEL
ALT: 16 IU/L (ref 0–44)
AST: 17 IU/L (ref 0–40)
Albumin/Globulin Ratio: 1.3 (ref 1.2–2.2)
Albumin: 4 g/dL (ref 3.7–4.7)
Alkaline Phosphatase: 85 IU/L (ref 44–121)
BUN/Creatinine Ratio: 8 — ABNORMAL LOW (ref 10–24)
BUN: 9 mg/dL (ref 8–27)
Bilirubin Total: 0.7 mg/dL (ref 0.0–1.2)
CO2: 27 mmol/L (ref 20–29)
Calcium: 9.4 mg/dL (ref 8.6–10.2)
Chloride: 101 mmol/L (ref 96–106)
Creatinine, Ser: 1.07 mg/dL (ref 0.76–1.27)
GFR calc Af Amer: 80 mL/min/{1.73_m2} (ref >59)
GFR calc non Af Amer: 69 mL/min/{1.73_m2} (ref >59)
Globulin, Total: 3.2 g/dL (ref 1.5–4.5)
Glucose: 106 mg/dL — ABNORMAL HIGH (ref 65–99)
Potassium: 4.7 mmol/L (ref 3.5–5.2)
Sodium: 138 mmol/L (ref 134–144)
Total Protein: 7.2 g/dL (ref 6.0–8.5)

## 2020-10-09 LAB — LIPID PANEL
Chol/HDL Ratio: 2.2 ratio (ref 0.0–5.0)
Cholesterol, Total: 105 mg/dL (ref 100–199)
HDL: 48 mg/dL (ref >39)
LDL Chol Calc (NIH): 43 mg/dL (ref 0–99)
Triglycerides: 61 mg/dL (ref 0–149)
VLDL Cholesterol Cal: 14 mg/dL (ref 5–40)

## 2020-10-09 NOTE — Progress Notes (Signed)
Cardiology Office Note    Date:  10/11/2020   ID:  [name], DOB 10-14-1951, MRN [id number]  PCP:  Jose Han, MD ([facility])  Cardiologist:  Jose Majestic, MD     History of Present Illness:  Jose Leonard is a 69 y.o. male who I  evaluated in May 2015.  Since that time, he has been seen by multiple APP's and most recently was evaluated by Jose Leonard.PA in December 2020.  He presented for reestablishment of care with me on June 03, 2020.  He presents for a 47-monthfollow-up evaluation.  Jose Leonard known CAD and underwent CABG revascularization in 2005 with a LIMA to his LAD, RIMA to the RCA, vein graft to diagonal, and vein graft to the marginal vessel. He has documented atresia of both arterial conduits. In January 2010 both vein grafts were patent. He apparently was not evaluated by cardiology and was lost to follow-up until 2017 when he experienced a syncopal episode. An echo Doppler study in May 2017 showed an EF of 55 to 60% with grade 1 diastolic this function and normal PA pressure at 26 mm. A heart monitor did not show any significant arrhythmia. Over the past several years he also has been treated by internal medicine for hypogonadism and by neurology for dementia. He saw Jose Leonard November 02, 2019 and was having worsening dementia. He underwent a follow-up echo Doppler study in December 2020 which showed an EF of 60 to 65% with mild LVH.. In February 2021 he was evaluated by Dr. KEllouise Newerof neurology. Neuropsychological testing in April 2020 was consistent with a diagnosis of Alzheimer's disease. An MRI of his brain in 2014 had shown moderate anterior and mesial temporal lobe atrophy.  When he reestablished care with me on June 03, 2020 he was having difficult times.  He lost his house and was living in hotel with his wife for 11 months. He is on oxygen supplement 2 L/min. He tested positive for Covid in January 2021 just prior to undergoing a  sleep test. As result is he never had a sleep study. He subsequently had Covid vaccination with his last vaccine approximately 2 months ago. He recently tested negative for Covid. He has experienced vague episodes of some nonexertional chest pain. Presently he admits to significant hypersomnolence. An Epworth Sleepiness Scale score was calculated in the office today and this endorsed at 21 as shown below:  Epworth Sleepiness Scale: Situation   Chance of Dozing/Sleeping (0 = never , 1 = slight chance , 2 = moderate chance , 3 = high chance )   sitting and reading 3   watching TV 3   sitting inactive in a public place 1   being a passenger in a motor vehicle for an hour or more 3   lying down in the afternoon 3   sitting and talking to someone 2   sitting quietly after lunch (no alcohol) 3   while stopped for a few minutes in traffic as the driver 3   Total Score  21    At his July evaluation, I recommended that he undergo a sleep study due to a high suspicion for sleep apnea.  He was also 16 years following his CABG revascularization and had not had any recent ischemic evaluation.  I recommended he undergo a LO[place]urststudy for further assessment.  His LDL cholesterol was elevated at 120 and I recommended initiation of rosuvastatin 20 mg.  Over the last several months he and his wife are no longer living in a hotel and are now renting a house.  He has had worsening issues with dementia.  He is on home oxygen at home.  He had started on rosuvastatin and is tolerating this.  He underwent a nuclear stress stress test on June 19, 2020 which was low risk.  On that report he states the patient was in atrial fibrillation.  However the patient has a hand tremor.  I personally reviewed the ECG and he was in sinus rhythm and not atrial fibrillation.  There was evidence for probable diaphragmatic attenuation without ischemia.  Clinically he is without chest pain.  His dementia is significant.  He also  has a mild tremor.  He is not active.  He denies chest pain or palpitations.  He is tolerating rosuvastatin but has not had follow-up laboratory.  He presents for evaluation.  Past Medical History:  Diagnosis Date   Anxiety state, unspecified    Arthropathy, unspecified, site unspecified    Asthma    BPH (benign prostatic hyperplasia)    CAD (coronary artery disease)    a. 8/20105 CABG x 4 (LIMA->LAD, RIMA->RCA, VG->Diag, VG->OM); b. 11/2008 Cath: patent VG's with atretic LIMA/RIMA. RCA 50; c. 12/2011 MV: EF 62%, no ischemia.   COPD (chronic obstructive pulmonary disease) (HCC)    Dementia (HCC)    Depression    Diverticulosis    Dyspnea    Sats drop when he is not wearing oxygen   Family history of other neurological diseases    Gallstones    GI bleed 09/2019   Hiatal hernia    Hyperlipidemia    Hypertensive heart disease    a. 07/2005 Echo: EF nl, mildly dil LA/RA, mild TR/AI.   Memory loss    Myocardial infarction ([place])    Presyncope and Syncope    a. Syncopal spell @ home 01/2016.   Schatzki's ring    Tubular adenoma of colon     Past Surgical History:  Procedure Laterality Date   AMPUTATION Left 05/10/2020   Procedure: LEFT LONG FINGER REVISION AMPUTATION;  Surgeon: Jose Crumb, MD;  Location: [facility];  Service: Orthopedics;  Laterality: Left;  LOCAL, BLOCK, MAC   ANGIOPLASTY     CARDIAC CATHETERIZATION  08/22/2004   CARDIAC CATHETERIZATION  12/17/2008   COLONOSCOPY     CORONARY ARTERY BYPASS GRAFT  2005   x3   ESOPHAGOGASTRODUODENOSCOPY     ROTATOR CUFF REPAIR Bilateral    ROTATOR CUFF REPAIR  2001,2002   ROTATOR CUFF REPAIR  01/2003    Current Medications: Outpatient Medications Prior to Visit  Medication Sig Dispense Refill   acetaminophen (TYLENOL) 500 MG tablet Take 1,000 mg by mouth every 8 (eight) hours as needed for mild pain or headache.     albuterol (PROVENTIL) (2.5 MG/3ML) 0.083% nebulizer solution Take 2.5 mg by  nebulization every 6 (six) hours as needed for wheezing or shortness of breath.     aspirin EC 81 MG tablet Take 1 tablet (81 mg total) by mouth daily.     budesonide (PULMICORT) 0.25 MG/2ML nebulizer solution Take 0.25 mg by nebulization 2 (two) times daily.     cetirizine (ZYRTEC) 10 MG tablet Take 10 mg by mouth daily.     cyanocobalamin (,VITAMIN B-12,) 1000 MCG/ML injection Inject 1,000 mcg into the muscle every 30 (thirty) days.     divalproex (DEPAKOTE ER) 250 MG 24 hr tablet Take 1 tablet (250 mg total)  by mouth daily. Take 1 tablet every night (Patient taking differently: Take 250 mg by mouth every evening. Take 1 tablet every night) 30 tablet 11   escitalopram (LEXAPRO) 20 MG tablet TAKE 1 TABLET BY MOUTH EVERY DAY 90 tablet 0   formoterol (PERFOROMIST) 20 MCG/2ML nebulizer solution Take 2 mLs (20 mcg total) by nebulization 2 (two) times daily. 60 mL 5   hydrOXYzine (ATARAX/VISTARIL) 25 MG tablet Take 25 mg by mouth every 8 (eight) hours as needed for anxiety.      Memantine HCl-Donepezil HCl (NAMZARIC) 28-10 MG CP24 Take 1 capsule by mouth daily.     nortriptyline (PAMELOR) 10 MG capsule Take 10 mg by mouth at bedtime as needed for sleep.     QUEtiapine (SEROQUEL) 50 MG tablet Take 50 mg by mouth at bedtime as needed (sleep).      rosuvastatin (CRESTOR) 20 MG tablet Take 1 tablet (20 mg total) by mouth daily. 90 tablet 3   tamsulosin (FLOMAX) 0.4 MG CAPS capsule Take 0.4 mg by mouth daily.     No facility-administered medications prior to visit.     Allergies:   Tape and Penicillins   Social History   Socioeconomic History   Marital status: Married    Spouse name: Not on file   Number of children: 2   Years of education: Not on file   Highest education level: Not on file  Occupational History   Occupation: disabled  Tobacco Use   Smoking status: Former Smoker    Years: 30.00    Quit date: 06/28/1983    Years since quitting: 37.3   Smokeless tobacco:  Never Used  Vaping Use   Vaping Use: Never used  Substance and Sexual Activity   Alcohol use: Not Currently    Alcohol/week: 1.0 standard drink    Types: 1 Standard drinks or equivalent per week    Comment: occ   Drug use: No   Sexual activity: Not on file  Other Topics Concern   Not on file  Social History Narrative   Not on file   Social Determinants of Health   Financial Resource Strain:    Difficulty of Paying Living Expenses: Not on file  Food Insecurity:    Worried About Charity fundraiser in the Last Year: Not on file   [employer] of Food in the Last Year: Not on file  Transportation Needs:    Lack of Transportation (Medical): Not on file   Lack of Transportation (Non-Medical): Not on file  Physical Activity:    Days of Exercise per Week: Not on file   Minutes of Exercise per Session: Not on file  Stress:    Feeling of Stress : Not on file  Social Connections:    Frequency of Communication with Friends and Family: Not on file   Frequency of Social Gatherings with Friends and Family: Not on file   Attends Religious Services: Not on file   Active Member of Clubs or Organizations: Not on file   Attends Archivist Meetings: Not on file   Marital Status: Not on file     Family History:  The patient's family history includes Arthritis in his brother, maternal grandfather, mother, and sister; Asthma in his brother and mother; Cancer in his daughter and paternal grandfather; Colon cancer in his mother; Coronary artery disease in his father; Dementia in his father and mother; Diabetes in his father and maternal grandfather; Heart attack in his mother; Parkinson's disease in his  brother and maternal uncle; Stroke in his father and maternal grandmother.   ROS General: Negative; No fevers, chills, or night sweats;  HEENT: Negative; No changes in vision or hearing, sinus congestion, difficulty swallowing Pulmonary: Negative; No cough, wheezing,  shortness of breath, hemoptysis Cardiovascular: Negative; No chest pain, presyncope, syncope, palpitations GI: Negative; No nausea, vomiting, diarrhea, or abdominal pain GU: Negative; No dysuria, hematuria, or difficulty voiding Musculoskeletal: Negative; no myalgias, joint pain, or weakness Hematologic/Oncology: Negative; no easy bruising, bleeding Endocrine: Negative; no heat/cold intolerance; no diabetes Neuro: Negative; no changes in balance, headaches Skin: Negative; No rashes or skin lesions Psychiatric: Negative; No behavioral problems, depression Sleep: Negative; No snoring, daytime sleepiness, hypersomnolence, bruxism, restless legs, hypnogognic hallucinations, no cataplexy Other comprehensive 14 point system review is negative.   PHYSICAL EXAM:   VS:  BP 112/68    Pulse 66    Ht _0  (1.753 m)    Wt 197 lb 9.6 oz (89.6 kg)    BMI 29.18 kg/m     Repeat blood pressure was 120/70  Wt Readings from Last 3 Encounters:  10/09/20 197 lb 9.6 oz (89.6 kg)  07/05/20 196 lb 9.6 oz (89.2 kg)  06/19/20 200 lb (90.7 kg)    General: Alert, oriented, no distress.  Skin: normal turgor, no rashes, warm and dry HEENT: Normocephalic, atraumatic. Pupils equal round and reactive to light; sclera anicteric; extraocular muscles intact;  Nose without nasal septal hypertrophy Mouth/Parynx benign; Mallinpatti scale 3 Neck: No JVD, no carotid bruits; normal carotid upstroke Lungs: clear to ausculatation and percussion; no wheezing or rales Chest wall: without tenderness to palpitation Heart: PMI not displaced, RRR, s1 s2 normal, 1/6 systolic murmur, no diastolic murmur, no rubs, gallops, thrills, or heaves Abdomen: soft, nontender; no hepatosplenomehaly, BS+; abdominal aorta nontender and not dilated by palpation. Back: no CVA tenderness Pulses 2+ Musculoskeletal: full range of motion, normal strength, no joint deformities Extremities: no clubbing cyanosis or edema, Homan's sign negative    Neurologic: grossly nonfocal; Cranial nerves grossly wnl Psychologic: Very flat affect   Studies/Labs Reviewed:   EKG:  EKG is ordered today.  ECG (independently read by me): NSR at 66; QS V1-2, no significant ST changes  June 03, 2020 ECG (independently read by me): Normal sinus rhythm at 61 bpm.  QS complex V2 and V3  Recent Labs: BMP Latest Ref Rng & Units 10/09/2020 05/10/2020 09/30/2019  Glucose 65 - 99 mg/dL 106(H) 93 104(H)  BUN 8 - 27 mg/dL 9 7(L) <5(L)  Creatinine 0.76 - 1.27 mg/dL 1.07 1.05 0.98  BUN/Creat Ratio 10 - 24 8(L) - -  Sodium 134 - 144 mmol/L 138 140 137  Potassium 3.5 - 5.2 mmol/L 4.7 3.9 4.1  Chloride 96 - 106 mmol/L 101 107 107  CO2 20 - 29 mmol/L _1 Calcium 8.6 - 10.2 mg/dL 9.4 8.9 8.8(L)     Hepatic Function Latest Ref Rng & Units 10/09/2020 05/31/2020 09/29/2019  Total Protein 6.0 - 8.5 g/dL 7.2 7.0 5.3(L)  Albumin 3.7 - 4.7 g/dL 4.0 4.1 2.9(L)  AST 0 - 40 IU/L 17 16 14(L)  ALT 0 - 44 IU/L _2 Alk Phosphatase 44 - 121 IU/L 85 89 48  Total Bilirubin 0.0 - 1.2 mg/dL 0.7 0.9 1.3(H)  Bilirubin, Direct 0.00 - 0.40 mg/dL - 0.22 -    CBC Latest Ref Rng & Units 05/10/2020 09/30/2019 09/29/2019  WBC 4.0 - 10.5 K/uL 6.5 6.2 5.5  Hemoglobin 13.0 - 17.0 g/dL  14.1 11.4(L) 12.2(L)  Hematocrit 39 - 52 % 44.7 34.3(L) 36.2(L)  Platelets 150 - 400 K/uL 244 198 190   Lab Results  Component Value Date   MCV 93.3 05/10/2020   MCV 91.5 09/30/2019   MCV 92.1 09/29/2019   Lab Results  Component Value Date   TSH 3.23 03/12/2016   Lab Results  Component Value Date   HGBA1C 5.9 (H) 06/27/2013     BNP No results found for: BNP  ProBNP    Component Value Date/Time   PROBNP 52.0 02/15/2009 1550     Lipid Panel     Component Value Date/Time   CHOL 105 10/09/2020 1103   TRIG 61 10/09/2020 1103   HDL 48 10/09/2020 1103   CHOLHDL 2.2 10/09/2020 1103   CHOLHDL 3.7 03/12/2016 1103   VLDL 23 03/12/2016 1103   LDLCALC 43 10/09/2020 1103    LABVLDL 14 10/09/2020 1103     RADIOLOGY: No results found.   Additional studies/ records that were reviewed today include:  I reviewed the patient's prior records. Cardiac monitor from June 2017 was reviewed which did not reveal any significant arrhythmias on monitoring. His most recent echo Doppler study from November 14, 2019 was reviewed which showed EF 60 to 65%, normal atrial size. No wall motion abnormalities. No significant valvular pathology. Neurologic evaluation was reviewed.  Nuclear stress test was reviewed.  Due to the patient's tremor it appeared that he was in atrial fibrillation but actually he was in sinus rhythm.  ASSESSMENT:    1. Coronary artery disease involving coronary bypass graft of native heart with angina pectoris ([place])   2. Hx of CABG   3. Hyperlipidemia with target LDL less than 70   4. Essential hypertension   5. Probable OSA (obstructive sleep apnea)   6. Snoring   7. Excessive daytime sleepiness   8. Alzheimer's dementia without behavioral disturbance, unspecified timing of dementia onset ([place])   9. Medication management     PLAN:  Jose Leonard is a 69 year old gentleman who underwent CABG revascularization surgery x4 in August 2005 with a LIMA to LAD, RIMA to RCA, SVG to diagonal and SVG to OM vessel. Subsequent catheterization,  revealed atresia of both arterial conduits. He has a history of hypertension, hyperlipidemia, COPD, as well as syncope. He has developed progressive dementia felt to be Alzheimer's type and has been on Namzaric. He was recently started on Depakote ER to 50 mg for mood stabilization as well as increased Lexapro daily. He has significant daytime sleepiness and an Epworth Sleepiness Scale score calculated at 21 consistent with significant hypersomnolence.  Since it has been 16 years following his CABG revascularization, he underwent a nuclear perfusion study.  I reviewed the ECG from that evaluation which was interpreted as  atrial fibrillation, but the patient has a tremor and in fact he was in sinus rhythm.  There was a inferior defect most likely due to diaphragmatic attenuation without ischemia.  His echo Doppler study from December 2020 showed an EF of 60 to 65% with mild LVH without wall motion abnormalities.  According to his wife his dementia is getting worse.  He will not pursue a sleep study.  He is on supplemental oxygen at home.  He has not had laboratory check since initiating rosuvastatin.  I have recommended a fasting comprehensive metabolic panel and lipid studies.  I will see him in 1 year for reevaluation or sooner as needed.   Medication Adjustments/Labs and Tests Ordered: Current medicines  are reviewed at length with the patient today.  Concerns regarding medicines are outlined above.  Medication changes, Labs and Tests ordered today are listed in the Patient Instructions below. Patient Instructions  Medication Instructions:  Continue current medications  *If you need a refill on your cardiac medications before your next appointment, please call your pharmacy*   Lab Work: Fasting Lipid and CMP  If you have labs (blood work) drawn today and your tests are completely normal, you will receive your results only by:  [facility] (if you have MyChart) OR  A paper copy in the mail If you have any lab test that is abnormal or we need to change your treatment, we will call you to review the results.   Testing/Procedures: None ordered   Follow-Up: At [facility], you and your health needs are our priority.  As part of our continuing mission to provide you with exceptional heart care, we have created designated Provider Care Teams.  These Care Teams include your primary Cardiologist (physician) and Advanced Practice Providers (APPs -  Physician Assistants and Nurse Practitioners) who all work together to provide you with the care you need, when you need it.  We recommend signing up for the  patient portal called "MyChart".  Sign up information is provided on this After Visit Summary.  MyChart is used to connect with patients for Virtual Visits (Telemedicine).  Patients are able to view lab/test results, encounter notes, upcoming appointments, etc.  Non-urgent messages can be sent to your provider as well.   To learn more about what you can do with MyChart, go to [email or web address].    Your next appointment:   1 year(s)  The format for your next appointment:   In Person  Provider:   You may see Jose Majestic, MD or one of the following Advanced Practice Providers on your designated Care Team:    Jose Deforest, PA-C  Fabian Sharp, PA-C or   Roby Lofts, [place]         Signed, Jose Majestic, MD  10/11/2020 11:21 AM    [facility] [address] [place], [place], [place]  [address] Phone: [phone]

## 2020-10-09 NOTE — Patient Instructions (Signed)
Medication Instructions:  Continue current medications  *If you need a refill on your cardiac medications before your next appointment, please call your pharmacy*   Lab Work: Fasting Lipid and CMP  If you have labs (blood work) drawn today and your tests are completely normal, you will receive your results only by: [facility] MyChart Message (if you have MyChart) OR . A paper copy in the mail If you have any lab test that is abnormal or we need to change your treatment, we will call you to review the results.   Testing/Procedures: None ordered   Follow-Up: At [facility], you and your health needs are our priority.  As part of our continuing mission to provide you with exceptional heart care, we have created designated Provider Care Teams.  These Care Teams include your primary Cardiologist (physician) and Advanced Practice Providers (APPs -  Physician Assistants and Nurse Practitioners) who all work together to provide you with the care you need, when you need it.  We recommend signing up for the patient portal called "MyChart".  Sign up information is provided on this After Visit Summary.  MyChart is used to connect with patients for Virtual Visits (Telemedicine).  Patients are able to view lab/test results, encounter notes, upcoming appointments, etc.  Non-urgent messages can be sent to your provider as well.   To learn more about what you can do with MyChart, go to [email or web address].    Your next appointment:   1 year(s)  The format for your next appointment:   In Person  Provider:   You may see Shelva Majestic, MD or one of the following Advanced Practice Providers on your designated Care Team:    [name], PA-C  Fabian Sharp, PA-C or   Roby Lofts, [place]

## 2020-10-11 ENCOUNTER — Encounter: Payer: Self-pay | Admitting: Cardiovascular Disease

## 2020-10-21 ENCOUNTER — Telehealth: Payer: Self-pay | Admitting: Cardiovascular Disease

## 2020-10-21 NOTE — Telephone Encounter (Signed)
Patient's wife is returning call to discuss results.

## 2020-10-21 NOTE — Telephone Encounter (Signed)
Spoke with patients wife Jose Leonard (okay per DPR), who is calling to get patients last blood work results.   Patients wife was made aware of the following:  [name], MD  10/15/2020 1:53 PM EST     Glucose mildly elevated. Renal function stable. Lipid studies significantly improved with LDL now at 43.   Patients wife verbalized understanding.

## 2020-11-27 ENCOUNTER — Ambulatory Visit (INDEPENDENT_AMBULATORY_CARE_PROVIDER_SITE_OTHER): Payer: Medicare Other | Admitting: Neurology

## 2020-11-27 ENCOUNTER — Encounter: Payer: Self-pay | Admitting: Neurology

## 2020-11-27 ENCOUNTER — Other Ambulatory Visit: Payer: Self-pay

## 2020-11-27 VITALS — BP 153/83 | HR 54 | Ht 69.0 in | Wt 202.6 lb

## 2020-11-27 DIAGNOSIS — F0281 Dementia in other diseases classified elsewhere with behavioral disturbance: Secondary | ICD-10-CM

## 2020-11-27 DIAGNOSIS — G301 Alzheimer's disease with late onset: Secondary | ICD-10-CM

## 2020-11-27 MED ORDER — ESCITALOPRAM OXALATE 20 MG PO TABS: 20.0000 mg | ORAL_TABLET | Freq: Every day | ORAL | 3 refills | Status: DC

## 2020-11-27 MED ORDER — DIVALPROEX SODIUM ER 250 MG PO TB24: 250.0000 mg | ORAL_TABLET | Freq: Every day | ORAL | 11 refills | Status: DC

## 2020-11-27 MED ORDER — NAMZARIC 28-10 MG PO CP24: 1.0000 | ORAL_CAPSULE | Freq: Every day | ORAL | 11 refills | Status: DC

## 2020-11-27 NOTE — Progress Notes (Signed)
NEUROLOGY FOLLOW UP OFFICE NOTE  [name] [id number] June 05, 1951  HISTORY OF PRESENT ILLNESS: I had the pleasure of seeing Jose Leonard in follow-up in the neurology clinic on 11/27/2020.  The patient was last seen almost a year ago for Alzheimer's disease. He is again accompanied by his wife who helps supplement the history today.  Records and images were personally reviewed where available.  SLUMS score 13/30. On his last visit, Lexapro was increased to 20mg  daily and Depakote ER 250mg  qhs was added due to increasing behavioral changes. He is on Namzaric daily. He feels his memory is pretty good, he has minimal insight into his condition and keeps repeating he is doing just fine. His wife reports continued decline. He and his wife were previously living in a hotel room, but have now moved into their home. 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 He would ask the same questions repeatedly. He always wants to walk, he would get out of the car when she makes deliveries. If she tries to lay down and rest or take a bath, he would go outside and walk up and down the driveway. He does not wander farther away, she put a GPS tracker in his pocket. She manages medications, finances, meals. He is able to take his own baths, but she is not sure how good he does it. Sometimes he puts on her clothes. He has difficulty putting on his shoes and socks, and cannot use the remote control to turn the TV on anymore. The only thing he can do it make his drink. He has bowel and bladder incontinence. No hallucinations. He would say he is up at night but she feels he sleeps well, she can hear him snoring. He forgets to turn on his O2 machine so she wakes him up to remind him. She has noticed right hand tremors that appear to be voluntary, he is able to stop it, it does not affect drinking from a cup or using utensils.     History on Initial Assessment 01/04/2020: This is a  70 year old right-handed man with a history of hyperlipidemia, anxiety, dementia, presenting to establish care. He feels his memory is "85-90%." He lives with his wife and grandson. They have been living in a motel for the past 6 months.His wife started noticing changes for several years, probably in his early to Jose Leonard. She has been administering his medications for several years, for a time he had stopped them. They used to do bills together, but now he cannot do it, he does not know how to use the credit card or pump gas. He stopped driving 1-2 years ago, he was getting lost. He does not recognize family members such as his cousin. He denies any word-finding difficulties. He can bathe and dress himself, but his wife reports that she has to remind him that he has to use shampoo or he would put the same clothes back on. He has difficulty using his inhaler. 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 He has had bowel and bladder accidents and wears pull-ups. She has noticed more personality changes, he gets aggravated very easily, he gets mad when told how to do things, cussing a lot. Everything seems to be an argument, he asks what she is doing "a  million times a day." He states his mood is okay. His wife reports he does not have an appetite and never wants to eat. He says he is not sleeping well but she watches him sleep and sees that he sleeps fine. No hallucinations but he has paranoia always feeling like they are talking about him. He has been on Namzaric for several years, no side effects. He is on Lexapro 10mg  daily and Seroquel 50mg  qhs. He has not started nortritpyline. He denies any headaches, dizziness, dysarthria/dysphagia, neck/back pain, focal numbness/tingling/weakness, anosmia, or tremors. His father and paternal grandmother had dementia. No history of significant head injuries, no  alcohol use.   He underwent Neuropsychological testing at [facility] in July 2020 with note that his pattern of performance indicated prominent memory loss with evidence of rapid forgetting, consistent with Alzheimer's disease. MRI brain in 2014 for memory loss showed moderate anterior and mesial temporal atrophy, small amount of left periventricular and subcortical foci of non-specific gliosis.    PAST MEDICAL HISTORY: Past Medical History:  Diagnosis Date  . Anxiety state, unspecified   . Arthropathy, unspecified, site unspecified   . Asthma   . BPH (benign prostatic hyperplasia)   . CAD (coronary artery disease)    a. 8/20105 CABG x 4 (LIMA->LAD, RIMA->RCA, VG->Diag, VG->OM); b. 11/2008 Cath: patent VG's with atretic LIMA/RIMA. RCA 50; c. 12/2011 MV: EF 62%, no ischemia.  Marland Kitchen COPD (chronic obstructive pulmonary disease) (Jose Leonard)   . Dementia ([place])   . Depression   . Diverticulosis   . Dyspnea    Sats drop when he is not wearing oxygen  . Family history of other neurological diseases   . Gallstones   . GI bleed 09/2019  . Hiatal hernia   . Hyperlipidemia   . Hypertensive heart disease    a. 07/2005 Echo: EF nl, mildly dil LA/RA, mild TR/AI.  Marland Kitchen Memory loss   . Myocardial infarction ([place])   . Presyncope and Syncope    a. Syncopal spell @ home 01/2016.  . Schatzki's ring   . Tubular adenoma of colon     MEDICATIONS: Current Outpatient Medications on File Prior to Visit  Medication Sig Dispense Refill  . acetaminophen (TYLENOL) 500 MG tablet Take 1,000 mg by mouth every 8 (eight) hours as needed for mild pain or headache.    . albuterol (PROVENTIL) (2.5 MG/3ML) 0.083% nebulizer solution Take 2.5 mg by nebulization every 6 (six) hours as needed for wheezing or shortness of breath.    Marland Kitchen aspirin EC 81 MG tablet Take 1 tablet (81 mg total) by mouth daily.    . budesonide (PULMICORT) 0.25 MG/2ML nebulizer solution Take 0.25 mg by nebulization 2 (two) times daily.    . cetirizine (ZYRTEC) 10 MG  tablet Take 10 mg by mouth daily.    . cyanocobalamin (,VITAMIN B-12,) 1000 MCG/ML injection Inject 1,000 mcg into the muscle every 30 (thirty) days.    . divalproex (DEPAKOTE ER) 250 MG 24 hr tablet Take 1 tablet (250 mg total) by mouth daily. Take 1 tablet every night (Patient taking differently: Take 250 mg by mouth every evening. Take 1 tablet every night) 30 tablet 11  . escitalopram (LEXAPRO) 20 MG tablet TAKE 1 TABLET BY MOUTH EVERY DAY 90 tablet 0  . formoterol (PERFOROMIST) 20 MCG/2ML nebulizer solution Take 2 mLs (20 mcg total) by nebulization 2 (two) times daily. 60 mL 5  . hydrOXYzine (ATARAX/VISTARIL) 25 MG tablet Take 25 mg by mouth every 8 (eight) hours as needed  for anxiety.    . Memantine HCl-Donepezil HCl (NAMZARIC) 28-10 MG CP24 Take 1 capsule by mouth daily.    . nortriptyline (PAMELOR) 10 MG capsule Take 10 mg by mouth at bedtime as needed for sleep.    Marland Kitchen QUEtiapine (SEROQUEL) 50 MG tablet Take 50 mg by mouth at bedtime as needed (sleep).     . rosuvastatin (CRESTOR) 20 MG tablet Take 1 tablet (20 mg total) by mouth daily. 90 tablet 3  . tamsulosin (FLOMAX) 0.4 MG CAPS capsule Take 0.4 mg by mouth daily.     No current facility-administered medications on file prior to visit.    ALLERGIES: Allergies  Allergen Reactions  . Tape Other (See Comments)    Skin turns red and draws blood   . Penicillins Other (See Comments)    Did it involve swelling of the face/tongue/throat, SOB, or low BP? Unknown Did it involve sudden or severe rash/hives, skin peeling, or any reaction on the inside of your mouth or nose? Unknown Did you need to seek medical attention at a hospital or doctor's office? Unknown When did it last happen? If all above answers are "NO", may proceed with cephalosporin use.  Childhood allergy    FAMILY HISTORY: Family History  Problem Relation Age of Onset  . Coronary artery disease Father        bypass x4  . Dementia Father   . Diabetes Father    . Stroke Father   . Dementia Mother   . Asthma Mother   . Colon cancer Mother   . Arthritis Mother   . Heart attack Mother   . Cancer Daughter   . Arthritis Sister   . Parkinson's disease Maternal Uncle   . Stroke Maternal Grandmother   . Diabetes Maternal Grandfather   . Arthritis Maternal Grandfather   . Cancer Paternal Grandfather   . Parkinson's disease Brother   . Arthritis Brother   . Asthma Brother     SOCIAL HISTORY: Social History   Socioeconomic History  . Marital status: Married    Spouse name: Not on file  . Number of children: 2  . Years of education: Not on file  . Highest education level: Not on file  Occupational History  . Occupation: disabled  Tobacco Use  . Smoking status: Former Smoker    Years: 30.00    Quit date: 06/28/1983    Years since quitting: 37.4  . Smokeless tobacco: Never Used  Vaping Use  . Vaping Use: Never used  Substance and Sexual Activity  . Alcohol use: Not Currently    Alcohol/week: 1.0 standard drink    Types: 1 Standard drinks or equivalent per week    Comment: occ  . Drug use: No  . Sexual activity: Not on file  Other Topics Concern  . Not on file  Social History Narrative   Right handed    Lives at home with wife   Social Determinants of Health   Financial Resource Strain: Not on file  Food Insecurity: Not on file  Transportation Needs: Not on file  Physical Activity: Not on file  Stress: Not on file  Social Connections: Not on file  Intimate Partner Violence: Not on file     PHYSICAL EXAM: Vitals:   11/27/20 1410  BP: (!) 153/83  Pulse: (!) 54  SpO2: 99%   General: No acute distress Head:  Normocephalic/atraumatic Skin/Extremities: No rash, no edema Neurological Exam: alert and oriented x2. No aphasia or dysarthria. Fund of knowledge is  reduced.  Recent and remote memory are impaired.  Attention and concentration are reduced. MMSE 19/30 MMSE - Mini Mental State Exam 11/27/2020  Orientation to time 4   Orientation to Place 3  Registration 3  Attention/ Calculation 2  Recall 0  Language- name 2 objects 2  Language- repeat 0  Language- follow 3 step command 3  Language- read & follow direction 1  Write a sentence 1  Copy design 0  Total score 19   Cranial nerves: Pupils equal, round. Extraocular movements intact with no nystagmus. Visual fields full.  No facial asymmetry.  Motor: Bulk and tone normal,no cogwheeling, muscle strength 5/5 throughout with no pronator drift.   Finger to nose testing intact.  Gait slow and cautious, no ataxia  QTc 417  IMPRESSION: This is a 70 yo RH man with a history of hyperlipidemia, anxiety, Alzheimer's disease. We discussed Neuropsychological evaluation results from April 2020, his wife seemed surprise to hear about these results. MRI brain in 2014 showed moderate anterior and mesial temporal lobe atrophy. MMSE today 19/30. Diagnosis, prognosis discussed with patient and wife, patient has no insight into his condition. Continue Namzaric, Lexapro 20mg  daily, and Depakote ER 250mg  qhs. His wife is having increasing difficulties caring for him full time, caregiver support provided, discussed looking into higher level of care. He does not drive. Follow-up in 6-8 months, they know to call for any changes.   Thank you for allowing me to participate in her care.  Please do not hesitate to call for any questions or concerns.   , M.D.   CC: Dr. 

## 2020-11-27 NOTE — Patient Instructions (Signed)
1. Continue all your medications  2. Recommend looking into higher level of care. Please contact [facility] [phone] to help with options  3. Follow-up in 6-8 months, call for any changes

## 2021-04-06 ENCOUNTER — Other Ambulatory Visit: Payer: Self-pay

## 2021-04-06 ENCOUNTER — Emergency Department (INDEPENDENT_AMBULATORY_CARE_PROVIDER_SITE_OTHER)
Admission: EM | Admit: 2021-04-06 | Discharge: 2021-04-06 | Disposition: A | Discharge status: Home / Self Care | Payer: Medicare Other | Source: Home / Self Care

## 2021-04-06 ENCOUNTER — Encounter: Payer: Self-pay | Admitting: Emergency Medicine

## 2021-04-06 DIAGNOSIS — J40 Bronchitis, not specified as acute or chronic: Secondary | ICD-10-CM

## 2021-04-06 DIAGNOSIS — J309 Allergic rhinitis, unspecified: Secondary | ICD-10-CM

## 2021-04-06 DIAGNOSIS — R059 Cough, unspecified: Secondary | ICD-10-CM | POA: Diagnosis not present

## 2021-04-06 MED ORDER — BENZONATATE 100 MG PO CAPS: 200.0000 mg | ORAL_CAPSULE | Freq: Three times a day (TID) | ORAL | 0 refills | Status: AC | PRN

## 2021-04-06 MED ORDER — FEXOFENADINE HCL 180 MG PO TABS: 180.0000 mg | ORAL_TABLET | Freq: Every day | ORAL | 0 refills | Status: AC

## 2021-04-06 MED ORDER — PREDNISONE 20 MG PO TABS: ORAL_TABLET | ORAL | 0 refills | Status: DC

## 2021-04-06 MED ORDER — DOXYCYCLINE HYCLATE 100 MG PO CAPS: 100.0000 mg | ORAL_CAPSULE | Freq: Two times a day (BID) | ORAL | 0 refills | Status: DC

## 2021-04-06 NOTE — ED Triage Notes (Signed)
Patient has been having a productive cough for about 3 days.  Chest congestion, no appetite.  Contacted PCP on call advised to come to UC for CXR and COVID.  Patient is vaccinated for COVID.

## 2021-04-06 NOTE — Discharge Instructions (Addendum)
Advised patient/spouse to take medication as directed with food to completion.  Advised patient/spouse to increase daily water intake while taking these medications.  Advised/instructed patient/spouse to discontinue Zyrtec and start Allegra (180 mg) daily for the next 5 to 7 days, the prn. Advised patient/spouse may use Tessalon Perles daily or as needed for cough.

## 2021-04-06 NOTE — ED Provider Notes (Signed)
[facility]    CSN: [id number] Arrival date & time: 04/06/21  1504      History   Chief Complaint Chief Complaint  Patient presents with  . Cough    HPI Jose Leonard is a 70 y.o. male.   HPI 70 year old male presents with productive cough, chest congestion, and reduced appetite for 3 days PMH significant for acute asthmatic bronchitis, asthma, chronic cough, and shortness of breath  Past Medical History:  Diagnosis Date  . Anxiety state, unspecified   . Arthropathy, unspecified, site unspecified   . Asthma   . BPH (benign prostatic hyperplasia)   . CAD (coronary artery disease)    a. 8/20105 CABG x 4 (LIMA->LAD, RIMA->RCA, VG->Diag, VG->OM); b. 11/2008 Cath: patent VG's with atretic LIMA/RIMA. RCA 50; c. 12/2011 MV: EF 62%, no ischemia.  Marland Kitchen COPD (chronic obstructive pulmonary disease) ([place])   . Dementia ([place])   . Depression   . Diverticulosis   . Dyspnea    Sats drop when he is not wearing oxygen  . Family history of other neurological diseases   . Gallstones   . GI bleed 09/2019  . Hiatal hernia   . Hyperlipidemia   . Hypertensive heart disease    a. 07/2005 Echo: EF nl, mildly dil LA/RA, mild TR/AI.  Marland Kitchen Memory loss   . Myocardial infarction ([place])   . Presyncope and Syncope    a. Syncopal spell @ home 01/2016.  . Schatzki's ring   . Tubular adenoma of colon     Patient Active Problem List   Diagnosis Date Noted  . Pharyngeal dysphagia 01/03/2020  . GI bleed 09/28/2019  . Family history of colon cancer in mother 07/26/2019  . Constipation 02/05/2019  . Dementia ([place]) 02/05/2019  . 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 CAD (coronary artery disease)   . Hyperlipidemia   . Presyncope and Syncope   . Hypertensive heart disease   . ASTHMA 04/30/2009  . SHORTNESS OF BREATH (SOB) 04/09/2009  . COUGH 04/09/2009  . Benign essential HTN 04/08/2009  . HYPERCHOLESTEROLEMIA 02/02/2008  . ANXIETY DISORDER 02/02/2008  . CORONARY ARTERY DISEASE 02/02/2008   . ASTHMATIC BRONCHITIS, ACUTE 02/02/2008  . HEADACHE, CHRONIC 02/02/2008  . DIVERTICULITIS, COLON 09/07/2007    Past Surgical History:  Procedure Laterality Date  . AMPUTATION Left 05/10/2020   Procedure: LEFT LONG FINGER REVISION AMPUTATION;  Surgeon: Charlotte Crumb, MD;  Location: [facility];  Service: Orthopedics;  Laterality: Left;  LOCAL, BLOCK, MAC  . ANGIOPLASTY    . CARDIAC CATHETERIZATION  08/22/2004  . CARDIAC CATHETERIZATION  12/17/2008  . COLONOSCOPY    . CORONARY ARTERY BYPASS GRAFT  2005   x3  . ESOPHAGOGASTRODUODENOSCOPY    . ROTATOR CUFF REPAIR Bilateral   . ROTATOR CUFF REPAIR  [id number]  . ROTATOR CUFF REPAIR  01/2003       Home Medications    Prior to Admission medications   Medication Sig Start Date End Date Taking? Authorizing Provider  acetaminophen (TYLENOL) 500 MG tablet Take 1,000 mg by mouth every 8 (eight) hours as needed for mild pain or headache.   Yes [provider]  albuterol (PROVENTIL) (2.5 MG/3ML) 0.083% nebulizer solution Take 2.5 mg by nebulization every 6 (six) hours as needed for wheezing or shortness of breath.   Yes [provider]  aspirin EC 81 MG tablet Take 1 tablet (81 mg total) by mouth daily. 11/02/19  Yes Meng, Isaac Laud, PA  benzonatate (TESSALON) 100 MG capsule Take 2 capsules (200  mg total) by mouth 3 (three) times daily as needed for up to 7 days for cough. 04/06/21 04/13/21 Yes Eliezer Lofts, FNP  budesonide (PULMICORT) 0.25 MG/2ML nebulizer solution Take 0.25 mg by nebulization 2 (two) times daily.   Yes [provider]  cyanocobalamin (,VITAMIN B-12,) 1000 MCG/ML injection Inject 1,000 mcg into the muscle every 30 (thirty) days.   Yes [provider]  divalproex (DEPAKOTE ER) 250 MG 24 hr tablet Take 1 tablet (250 mg total) by mouth daily. Take 1 tablet every night 11/27/20  Yes Cameron Sprang, MD  doxycycline (VIBRAMYCIN) 100 MG capsule Take 1 capsule (100 mg total) by mouth 2 (two) times daily.  04/06/21  Yes Eliezer Lofts, FNP  escitalopram (LEXAPRO) 20 MG tablet Take 1 tablet (20 mg total) by mouth daily. 11/27/20  Yes Cameron Sprang, MD  fexofenadine [facility] ALLERGY) 180 MG tablet Take 1 tablet (180 mg total) by mouth daily for 15 days. 04/06/21 04/21/21 Yes Eliezer Lofts, FNP  formoterol (PERFOROMIST) 20 MCG/2ML nebulizer solution Take 2 mLs (20 mcg total) by nebulization 2 (two) times daily. 07/08/20  Yes Hunsucker, Bonna Gains, MD  hydrOXYzine (ATARAX/VISTARIL) 25 MG tablet Take 25 mg by mouth every 8 (eight) hours as needed for anxiety. 04/30/15  Yes [provider]  Memantine HCl-Donepezil HCl (NAMZARIC) 28-10 MG CP24 Take 1 capsule by mouth daily. 11/27/20  Yes Cameron Sprang, MD  nortriptyline (PAMELOR) 10 MG capsule Take 10 mg by mouth at bedtime as needed for sleep.   Yes [provider]  predniSONE (DELTASONE) 20 MG tablet Take 3 tabs PO daily x 5 days. 04/06/21  Yes Eliezer Lofts, FNP  QUEtiapine (SEROQUEL) 50 MG tablet Take 50 mg by mouth at bedtime as needed (sleep).    Yes [provider]  tamsulosin (FLOMAX) 0.4 MG CAPS capsule Take 0.4 mg by mouth daily. 09/08/19  Yes [provider]  cetirizine (ZYRTEC) 10 MG tablet Take 10 mg by mouth daily.  04/06/21 Yes [provider]  rosuvastatin (CRESTOR) 20 MG tablet Take 1 tablet (20 mg total) by mouth daily. 06/03/20 10/09/20  [name], MD    Family History Family History  Problem Relation Age of Onset  . Coronary artery disease Father        bypass x4  . Dementia Father   . Diabetes Father   . Stroke Father   . Dementia Mother   . Asthma Mother   . Colon cancer Mother   . Arthritis Mother   . Heart attack Mother   . Cancer Daughter   . Arthritis Sister   . Parkinson's disease Maternal Uncle   . Stroke Maternal Grandmother   . Diabetes Maternal Grandfather   . Arthritis Maternal Grandfather   . Cancer Paternal Grandfather   . Parkinson's disease Brother   . Arthritis  Brother   . Asthma Brother     Social History Social History   Tobacco Use  . Smoking status: Former Smoker    Years: 30.00    Quit date: 06/28/1983    Years since quitting: 37.8  . Smokeless tobacco: Never Used  Vaping Use  . Vaping Use: Never used  Substance Use Topics  . Alcohol use: Not Currently    Alcohol/week: 1.0 standard drink    Types: 1 Standard drinks or equivalent per week    Comment: occ  . Drug use: No     Allergies   Tape and Penicillins   Review of Systems Review of  Systems  Constitutional: Positive for appetite change.  HENT: Positive for congestion.   Eyes: Negative.   Respiratory: Positive for cough.   Cardiovascular: Negative.   Gastrointestinal: Negative.   Genitourinary: Negative.   Musculoskeletal: Negative.   Skin: Negative.   Neurological: Negative.      Physical Exam Triage Vital Signs ED Triage Vitals  Enc Vitals Group     BP 04/06/21 1535 133/79     Pulse Rate 04/06/21 1535 71     Resp --      Temp 04/06/21 1535 99.2 F (37.3 C)     Temp Source 04/06/21 1535 Oral     SpO2 04/06/21 1535 92 %     Weight --      Height --      Head Circumference --      Peak Flow --      Pain Score 04/06/21 1536 0     Pain Loc --      Pain Edu? --      Excl. in [place]? --    No data found.  Updated Vital Signs BP 133/79 (BP Location: Left Arm)   Pulse 71   Temp 99.2 F (37.3 C) (Oral)   SpO2 92%      Physical Exam Vitals and nursing note reviewed.  Constitutional:      Appearance: Normal appearance. He is obese.  HENT:     Head: Normocephalic and atraumatic.     Right Ear: Tympanic membrane and ear canal normal.     Left Ear: Tympanic membrane and ear canal normal.     Nose: Nose normal.     Mouth/Throat:     Mouth: Mucous membranes are moist.     Pharynx: Oropharynx is clear.     Comments: Moderate clear drainage of posterior pharynx noted Eyes:     Extraocular Movements: Extraocular movements intact.     Conjunctiva/sclera:  Conjunctivae normal.     Pupils: Pupils are equal, round, and reactive to light.  Cardiovascular:     Rate and Rhythm: Normal rate and regular rhythm.     Pulses: Normal pulses.     Heart sounds: Normal heart sounds.  Pulmonary:     Effort: Pulmonary effort is normal.     Breath sounds: No wheezing or rales.     Comments: Diffuse scattered rhonchi noted throughout Musculoskeletal:        General: Normal range of motion.     Cervical back: Normal range of motion and neck supple. No tenderness.  Lymphadenopathy:     Cervical: No cervical adenopathy.  Skin:    General: Skin is warm and dry.  Neurological:     General: No focal deficit present.     Mental Status: He is alert.  Psychiatric:        Mood and Affect: Mood normal.        Behavior: Behavior normal.      UC Treatments / Results  Labs (all labs ordered are listed, but only abnormal results are displayed) Labs Reviewed  COVID-19, FLU A+B NAA    EKG   Radiology No results found.  Procedures Procedures (including critical care time)  Medications Ordered in UC Medications - No data to display  Initial Impression / Assessment and Plan / UC Course  I have reviewed the triage vital signs and the nursing notes.  Pertinent labs & imaging results that were available during my care of the patient were reviewed by me and considered in my medical decision  making (see chart for details).     DM: 1.  Bronchitis, 2.  Cough, 3.  Allergic rhinitis patient discharged home, hemodynamically stable.  COVID-19/flu A&B ordered. Final Clinical Impressions(s) / UC Diagnoses   Final diagnoses:  Cough  Bronchitis  Allergic rhinitis, unspecified seasonality, unspecified trigger     Discharge Instructions     Advised patient/spouse to take medication as directed with food to completion.  Advised patient/spouse to increase daily water intake while taking these medications.  Advised/instructed patient/spouse to discontinue  Zyrtec and start Allegra (180 mg) daily for the next 5 to 7 days, the prn. Advised patient/spouse may use Tessalon Perles daily or as needed for cough.    ED Prescriptions    Medication Sig Dispense Auth. Provider   doxycycline (VIBRAMYCIN) 100 MG capsule Take 1 capsule (100 mg total) by mouth 2 (two) times daily. 20 capsule Eliezer Lofts, FNP   predniSONE (DELTASONE) 20 MG tablet Take 3 tabs PO daily x 5 days. 15 tablet Eliezer Lofts, FNP   fexofenadine [facility] ALLERGY) 180 MG tablet Take 1 tablet (180 mg total) by mouth daily for 15 days. 15 tablet Eliezer Lofts, FNP   benzonatate (TESSALON) 100 MG capsule Take 2 capsules (200 mg total) by mouth 3 (three) times daily as needed for up to 7 days for cough. 30 capsule Eliezer Lofts, FNP     PDMP not reviewed this encounter.   Eliezer Lofts, [place] 04/06/21 1709

## 2021-04-09 ENCOUNTER — Telehealth: Payer: Self-pay

## 2021-04-09 LAB — COVID-19, FLU A+B NAA
Influenza A, NAA: NOT DETECTED
Influenza B, NAA: NOT DETECTED
SARS-CoV-2, NAA: NOT DETECTED

## 2021-04-09 NOTE — Telephone Encounter (Signed)
Called to get covid results. Informed results are neg.

## 2021-05-26 ENCOUNTER — Other Ambulatory Visit: Payer: Self-pay | Admitting: Pulmonary Disease

## 2021-05-26 DIAGNOSIS — J449 Chronic obstructive pulmonary disease, unspecified: Secondary | ICD-10-CM

## 2021-05-29 ENCOUNTER — Encounter: Payer: Self-pay | Admitting: Physician Assistant

## 2021-05-29 ENCOUNTER — Other Ambulatory Visit: Payer: Self-pay

## 2021-05-29 ENCOUNTER — Ambulatory Visit (INDEPENDENT_AMBULATORY_CARE_PROVIDER_SITE_OTHER): Payer: Medicare Other | Admitting: Physician Assistant

## 2021-05-29 VITALS — BP 124/84 | HR 67 | Ht 69.0 in | Wt 205.4 lb

## 2021-05-29 DIAGNOSIS — F0281 Dementia in other diseases classified elsewhere with behavioral disturbance: Secondary | ICD-10-CM

## 2021-05-29 DIAGNOSIS — F02818 Dementia in other diseases classified elsewhere, unspecified severity, with other behavioral disturbance: Secondary | ICD-10-CM

## 2021-05-29 DIAGNOSIS — G301 Alzheimer's disease with late onset: Secondary | ICD-10-CM

## 2021-05-29 MED ORDER — DIVALPROEX SODIUM ER 250 MG PO TB24: 250.0000 mg | ORAL_TABLET | Freq: Every day | ORAL | 11 refills | Status: AC

## 2021-05-29 MED ORDER — ESCITALOPRAM OXALATE 20 MG PO TABS: 20.0000 mg | ORAL_TABLET | Freq: Every day | ORAL | 3 refills | Status: AC

## 2021-05-29 MED ORDER — NAMZARIC 28-10 MG PO CP24: 1.0000 | ORAL_CAPSULE | Freq: Every day | ORAL | 11 refills | Status: DC

## 2021-05-29 NOTE — Patient Instructions (Addendum)
It was a pleasure to see you today at our office.   Recommendations:  Follow up in  6 months Continue Lexapro 20 mg daily Continue Namzaric daily as directed  Continue Depakote ER 250 mg qhs  [employer] information is for you to read and consider for respite   RECOMMENDATIONS FOR ALL PATIENTS WITH MEMORY PROBLEMS: 1. Continue to exercise (Recommend 30 minutes of walking everyday, or 3 hours every week) 2. Increase social interactions - continue going to [place] and enjoy social gatherings with friends and family 3. Eat healthy, avoid fried foods and eat more fruits and vegetables 4. Maintain adequate blood pressure, blood sugar, and blood cholesterol level. Reducing the risk of stroke and cardiovascular disease also helps promoting better memory. 5. Avoid stressful situations. Live a simple life and avoid aggravations. Organize your time and prepare for the next day in anticipation. 6. Sleep well, avoid any interruptions of sleep and avoid any distractions in the bedroom that may interfere with adequate sleep quality 7. Avoid sugar, avoid sweets as there is a strong link between excessive sugar intake, diabetes, and cognitive impairment We discussed the Mediterranean diet, which has been shown to help patients reduce the risk of progressive memory disorders and reduces cardiovascular risk. This includes eating fish, eat fruits and green leafy vegetables, nuts like almonds and hazelnuts, walnuts, and also use olive oil. Avoid fast foods and fried foods as much as possible. Avoid sweets and sugar as sugar use has been linked to worsening of memory function.  There is always a concern of gradual progression of memory problems. If this is the case, then we may need to adjust level of care according to patient needs. Support, both to the patient and caregiver, should then be put into place.      You have been referred for a neuropsychological evaluation (i.e., evaluation of memory and  thinking abilities). Please bring someone with you to this appointment if possible, as it is helpful for the doctor to hear from both you and another adult who knows you well. Please bring eyeglasses and hearing aids if you wear them.    The evaluation will take approximately 3 hours and has two parts:   The first part is a clinical interview with the neuropsychologist (Dr. Melvyn Novas or Dr. Nicole Kindred). During the interview, the neuropsychologist will speak with you and the individual you brought to the appointment.    The second part of the evaluation is testing with the doctor's technician Hinton Dyer or Maudie Mercury). During the testing, the technician will ask you to remember different types of material, solve problems, and answer some questionnaires. Your family member will not be present for this portion of the evaluation.   Please note: We must reserve several hours of the neuropsychologist's time and the psychometrician's time for your evaluation appointment. As such, there is a No-Show fee of $100. If you are unable to attend any of your appointments, please contact our office as soon as possible to reschedule.    FALL PRECAUTIONS: Be cautious when walking. Scan the area for obstacles that may increase the risk of trips and falls. When getting up in the mornings, sit up at the edge of the bed for a few minutes before getting out of bed. Consider elevating the bed at the head end to avoid drop of blood pressure when getting up. Walk always in a well-lit room (use night lights in the walls). Avoid area rugs or power cords from appliances in the middle of the  walkways. Use a walker or a cane if necessary and consider physical therapy for balance exercise. Get your eyesight checked regularly.  FINANCIAL OVERSIGHT: Supervision, especially oversight when making financial decisions or transactions is also recommended.  HOME SAFETY: Consider the safety of the kitchen when operating appliances like stoves, microwave oven,  and blender. Consider having supervision and share cooking responsibilities until no longer able to participate in those. Accidents with firearms and other hazards in the house should be identified and addressed as well.   ABILITY TO BE LEFT ALONE: If patient is unable to contact 911 operator, consider using LifeLine, or when the need is there, arrange for someone to stay with patients. Smoking is a fire hazard, consider supervision or cessation. Risk of wandering should be assessed by caregiver and if detected at any point, supervision and safe proof recommendations should be instituted.  MEDICATION SUPERVISION: Inability to self-administer medication needs to be constantly addressed. Implement a mechanism to ensure safe administration of the medications.   DRIVING: Regarding driving, in patients with progressive memory problems, driving will be impaired. We advise to have someone else do the driving if trouble finding directions or if minor accidents are reported. Independent driving assessment is available to determine safety of driving.   If you are interested in the driving assessment, you can contact the following:  [employer] in [place]  [place] [phone] or [phone]    [place] refers to food and lifestyle choices that are based on the traditions of countries located on the [employer]. This way of eating has been shown to help prevent certain conditions and improve outcomes for people who have chronic diseases, like kidney disease and heart disease. What are tips for following this plan? Lifestyle  Cook and eat meals together with your family, when possible. Drink enough fluid to keep your urine clear or pale yellow. Be physically active every day. This includes: Aerobic exercise like running or swimming. Leisure  activities like gardening, walking, or housework. Get 7-8 hours of sleep each night. If recommended by your health care provider, drink red wine in moderation. This means 1 glass a day for nonpregnant women and 2 glasses a day for men. A glass of wine equals 5 oz (150 mL). Reading food labels  Check the serving size of packaged foods. For foods such as rice and pasta, the serving size refers to the amount of cooked product, not dry. Check the total fat in packaged foods. Avoid foods that have saturated fat or trans fats. Check the ingredients list for added sugars, such as corn syrup. Shopping  At the grocery store, buy most of your food from the areas near the walls of the store. This includes: Fresh fruits and vegetables (produce). Grains, beans, nuts, and seeds. Some of these may be available in unpackaged forms or large amounts (in bulk). Fresh seafood. Poultry and eggs. Low-fat dairy products. Buy whole ingredients instead of prepackaged foods. Buy fresh fruits and vegetables in-season from local farmers markets. Buy frozen fruits and vegetables in resealable bags. If you do not have access to quality fresh seafood, buy precooked frozen shrimp or canned fish, such as tuna, salmon, or sardines. Buy small amounts of raw or cooked vegetables, salads, or olives from the deli or salad bar at your store. Stock your pantry so you always have certain foods on hand, such as olive oil, canned tuna, canned tomatoes,  rice, pasta, and beans. Cooking  Cook foods with extra-virgin olive oil instead of using butter or other vegetable oils. Have meat as a side dish, and have vegetables or grains as your main dish. This means having meat in small portions or adding small amounts of meat to foods like pasta or stew. Use beans or vegetables instead of meat in common dishes like chili or lasagna. Experiment with different cooking methods. Try roasting or broiling vegetables instead of steaming or sauteing  them. Add frozen vegetables to soups, stews, pasta, or rice. Add nuts or seeds for added healthy fat at each meal. You can add these to yogurt, salads, or vegetable dishes. Marinate fish or vegetables using olive oil, lemon juice, garlic, and fresh herbs. Meal planning  Plan to eat 1 vegetarian meal one day each week. Try to work up to 2 vegetarian meals, if possible. Eat seafood 2 or more times a week. Have healthy snacks readily available, such as: Vegetable sticks with hummus. Greek yogurt. Fruit and nut trail mix. Eat balanced meals throughout the week. This includes: Fruit: 2-3 servings a day Vegetables: 4-5 servings a day Low-fat dairy: 2 servings a day Fish, poultry, or lean meat: 1 serving a day Beans and legumes: 2 or more servings a week Nuts and seeds: 1-2 servings a day Whole grains: 6-8 servings a day Extra-virgin olive oil: 3-4 servings a day Limit red meat and sweets to only a few servings a month What are my food choices? Mediterranean diet Recommended Grains: Whole-grain pasta. Brown rice. Bulgar wheat. Polenta. Couscous. Whole-wheat bread. [place]. Vegetables: Artichokes. Beets. Broccoli. Cabbage. Carrots. Eggplant. Green beans. Chard. Kale. Spinach. Onions. Leeks. Peas. Squash. Tomatoes. Peppers. Radishes. Fruits: Apples. Apricots. Avocado. Berries. Bananas. Cherries. Dates. Figs. Grapes. Lemons. Melon. Oranges. Peaches. Plums. Pomegranate. Meats and other protein foods: Beans. Almonds. Sunflower seeds. Pine nuts. Peanuts. [place]. Salmon. Scallops. Shrimp. [place]. Tilapia. Clams. Oysters. Eggs. Dairy: Low-fat milk. Cheese. Greek yogurt. Beverages: Water. Red wine. Herbal tea. Fats and oils: Extra virgin olive oil. Avocado oil. Grape seed oil. Sweets and desserts: [place] yogurt with honey. Baked apples. Poached pears. Trail mix. Seasoning and other foods: Basil. Cilantro. Coriander. Cumin. Mint. Parsley. Sage. Rosemary. Tarragon. Garlic. Oregano. Thyme. Pepper.  Balsalmic vinegar. Tahini. Hummus. Tomato sauce. Olives. Mushrooms. Limit these Grains: Prepackaged pasta or rice dishes. Prepackaged cereal with added sugar. Vegetables: Deep fried potatoes (french fries). Fruits: Fruit canned in syrup. Meats and other protein foods: Beef. Pork. Lamb. Poultry with skin. Hot dogs. [place] Salines. Dairy: Ice cream. Sour cream. Whole milk. Beverages: Juice. Sugar-sweetened soft drinks. Beer. Liquor and spirits. Fats and oils: Butter. Canola oil. Vegetable oil. Beef fat (tallow). Lard. Sweets and desserts: Cookies. Cakes. Pies. Candy. Seasoning and other foods: Mayonnaise. Premade sauces and marinades. The items listed may not be a complete list. Talk with your dietitian about what dietary choices are right for you. Summary The Mediterranean diet includes both food and lifestyle choices. Eat a variety of fresh fruits and vegetables, beans, nuts, seeds, and whole grains. Limit the amount of red meat and sweets that you eat. Talk with your health care provider about whether it is safe for you to drink red wine in moderation. This means 1 glass a day for nonpregnant women and 2 glasses a day for men. A glass of wine equals 5 oz (150 mL). This information is not intended to replace advice given to you by your health care provider. Make sure you discuss any questions you have with your health  care provider. Document Released: 07/02/2016 Document Revised: 08/04/2016 Document Reviewed: 07/02/2016 Elsevier Interactive Patient Education  2017 [employer].

## 2021-05-29 NOTE — Progress Notes (Signed)
Assessment/Plan:    Late onset Alzheimer's disease with behavioral disturbance  MMSE today is 12/30 ( MMSE  during his last visit was 19/30)  with deficiencies in visuospatial/executive, memory, attention, language, abstraction, delayed recall 0/3 orientation 0/5. He is on Namzaric tolerating well. He is also on Lexapro 20 mg  and Depakote 250 mg qhs for mood control. Patient's wife is interested in the Adult Day Program or the like,for safety, mental stimulation, as well as respite for her during those hours, preferably in [facility] for [employer].   Recommendations:  Discussed safety both in and out of the home.  Discussed the importance of regular daily schedule. Information about [facility] program for Adult Day Care was provided as his wife is having increasing difficulties caring for him full time. Caregiver support and [employer] information given as well. Stay active at least 30 minutes at least 3 times a week.  Naps should be scheduled and should be no longer than 60 minutes and should not occur after 2 PM.  Mediterranean diet is recommended  Continue Namzaric daily Side effects were discussed Continue Lexapro 20 mg daily, for mood side effects discussed Continue Depakote ER 250 mh qhs for mood, discussed side effects Follow up in 6  months.   Case discussed with Dr. Delice Lesch who agrees with the plan     Subjective:    ED visits since last seen: [place] 04/06/21. [facility] admissions: none  Jose Leonard is a 70 y.o. male  seen today in follow up for memory loss.  Last visit was on 11/27/2020.  This patient is accompanied in the office by his wife who supplements the history.  Previous records as well as any outside records available were reviewed prior to todays visit.  Patient is currently on Namzaric 28-10 mg tolerating well.  He reports that his memory is "pretty good ", but his wife adds that he has minimal insight to his condition, keeps repeating  that he is doing fine and that he continues to decline, gets lost within the house, "walks around in circles ", and he cannot follow directions.  He seems to look at new clothes that she places on the bed for him to change, and he cannot remember to change, unless is the same clothes that he would before.  He has special difficulty with putting socks on.  He continues to shower on his own on a daily basis.  He sleeps well with the help of the medications, without nightmares or sleepwalking.  He does take a few naps a day.   Outside, he walks up and down the hill "about the 100 times a day".  If she does not watch him, he will walk outside, "he does not stop ".  When she drives during deliveries, he attempts to get out of the car and tries to wander off.  She has placed a GPS tracker in his pocket.   His wife provides constant supervision, she reports that there is no one who can take care of him otherwise.  He has grown children "who has been not seen in a very long time despite a living close to the house".  When she is working, doing deliveries, she takes him along, he mimics what she does, and "gives directions ".  She reports being very exhausted, and she does look visibly upset and very tired.  She states that, he is very much attached to her and goes wherever she goes. She wishes that she  could attend a day program, but "he refuses over and over".  She is willing to give it another try.  No hallucinations or paranoia. She manages the medications, finances and meals.  He is eating soft foods, because being edentulous, his wife is afraid that he will choke.  His appetite is good.  The only thing that he is able to do is make his drink.  He has chronic bladder and bowel incontinence.  He has known right hand tremors, but he is able to stop voluntarily,  not affecting drinking tonic or using utensils.   History on Initial Assessment 01/04/2020: This is a 70 year old right-handed man with a history of  hyperlipidemia, anxiety, dementia, presenting to establish care. He feels his memory is "85-90%." He lives with his wife and grandson. They have been living in a motel for the past 6 months.His wife started noticing changes for several years, probably in his early to [place]. She has been administering his medications for several years, for a time he had stopped them. They used to do bills together, but now he cannot do it, he does not know how to use the credit card or pump gas. He stopped driving 1-2 years ago, he was getting lost. He does not recognize family members such as his cousin. He denies any word-finding difficulties. He can bathe and dress himself, but his wife reports that she has to remind him that he has to use shampoo or he would put the same clothes back on. He has difficulty using his inhaler. 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 He has had bowel and bladder accidents and wears pull-ups. She has noticed more personality changes, he gets aggravated very easily, he gets mad when told how to do things, cussing a lot. Everything seems to be an argument, he asks what she is doing "a million times a day." He states his mood is okay. His wife reports he does not have an appetite and never wants to eat. He says he is not sleeping well but she watches him sleep and sees that he sleeps fine. No hallucinations but he has paranoia always feeling like they are talking about him. He has been on Namzaric for several years, no side effects. He is on Lexapro 10mg  daily and Seroquel 50mg  qhs. He has not started nortritpyline. He denies any headaches, dizziness, dysarthria/dysphagia, neck/back pain, focal numbness/tingling/weakness, anosmia, or tremors. His father and paternal grandmother had dementia. No history of significant head injuries, no alcohol use.   He underwent  Ne[facility]ological [facility] at [facility] in July 2020 with note that his pattern of performance indicated prominent memory loss with evidence of rapid forgetting, consistent with Alzheimer's disease. MRI brain in 2014 for memory loss showed moderate anterior and mesial temporal atrophy, small amount of left periventricular and subcortical foci of non-specific gliosis.    PREVIOUS MEDICATIONS:   CURRENT MEDICATIONS:  Outpatient Encounter Medications as of 05/29/2021  Medication Sig   acetaminophen (TYLENOL) 500 MG tablet Take 1,000 mg by mouth every 8 (eight) hours as needed for mild pain or headache.   albuterol (PROVENTIL) (2.5 MG/3ML) 0.083% nebulizer solution Take 2.5 mg by nebulization every 6 (six) hours as needed for wheezing or shortness of breath.   aspirin EC 81 MG tablet Take 1 tablet (81 mg total) by mouth daily.   budesonide (PULMICORT)  0.25 MG/2ML nebulizer solution USE 1 VIAL IN NEBULIZER EVERY 12 HOURS - Rinse mouth after treatment   cyanocobalamin (,VITAMIN B-12,) 1000 MCG/ML injection Inject 1,000 mcg into the muscle every 30 (thirty) days.   hydrOXYzine (ATARAX/VISTARIL) 25 MG tablet Take 25 mg by mouth every 8 (eight) hours as needed for anxiety.   nortriptyline (PAMELOR) 10 MG capsule Take 10 mg by mouth at bedtime as needed for sleep.   PERFOROMIST 20 MCG/2ML nebulizer solution USE 1 VIAL  IN  NEBULIZER TWICE  DAILY - morning and evening   QUEtiapine (SEROQUEL) 50 MG tablet Take 50 mg by mouth at bedtime as needed (sleep).    rosuvastatin (CRESTOR) 20 MG tablet Take 1 tablet (20 mg total) by mouth daily.   tamsulosin (FLOMAX) 0.4 MG CAPS capsule Take 0.4 mg by mouth daily.   [DISCONTINUED] divalproex (DEPAKOTE ER) 250 MG 24 hr tablet Take 1 tablet (250 mg total) by mouth daily. Take 1 tablet every night   [DISCONTINUED] escitalopram (LEXAPRO) 20 MG tablet Take 1 tablet (20 mg total) by mouth daily.   [DISCONTINUED] Memantine HCl-Donepezil HCl (NAMZARIC) 28-10 MG CP24 Take 1 capsule  by mouth daily.   [DISCONTINUED] predniSONE (DELTASONE) 20 MG tablet Take 3 tabs PO daily x 5 days.   divalproex (DEPAKOTE ER) 250 MG 24 hr tablet Take 1 tablet (250 mg total) by mouth daily. Take 1 tablet every night   escitalopram (LEXAPRO) 20 MG tablet Take 1 tablet (20 mg total) by mouth daily.   fexofenadine (ALLEGRA ALLERGY) 180 MG tablet Take 1 tablet (180 mg total) by mouth daily for 15 days.   Memantine HCl-Donepezil HCl (NAMZARIC) 28-10 MG CP24 Take 1 capsule by mouth daily.   [DISCONTINUED] cetirizine (ZYRTEC) 10 MG tablet Take 10 mg by mouth daily.   [DISCONTINUED] doxycycline (VIBRAMYCIN) 100 MG capsule Take 1 capsule (100 mg total) by mouth 2 (two) times daily. (Patient not taking: Reported on 05/29/2021)   No facility-administered encounter medications on file as of 05/29/2021.     Objective:     PHYSICAL EXAMINATION:    VITALS:   Vitals:   05/29/21 1022  BP: 124/84  Pulse: 67  SpO2: 95%  Weight: 205 lb 6 oz (93.2 kg)  Height: 5\' 9"  (1.753 m)    GEN:  The patient appears stated age and is in NAD. HEENT:  Normocephalic, atraumatic.   Neurological examination:  General: NAD, well-groomed, appears stated age. Orientation: The patient is alert. Oriented to person not to place or date Cranial nerves: There is good facial symmetry.The speech is not fluent but clear. No aphasia or dysarthria. Fund of knowledge is reduced. Recent and remote memory are impaired. Attention and concentration are reduced.  Able to name objects and  unable to repeat phrases.  Hearing is intact to conversational tone.    Sensation: Sensation is intact to light touch throughout Motor: Strength is at least antigravity x4.   No flowsheet data found.   MMSE - Mini Mental State Exam 05/29/2021 11/27/2020  Orientation to time 0 4  Orientation to Place 2 3  Registration 3 3  Attention/ Calculation 0 2  Recall 0 0  Language- name 2 objects 2 2  Language- repeat 0 0  Language- follow 3 step command 3  3  Language- read & follow direction 1 1  Write a sentence 0 1  Copy design 1 0  Total score 12 19      Movement examination: Tone: There is normal tone in the UE/LE  Abnormal movements:  R tremor controlled when instructed to stop.  No myoclonus.  No asterixis.   Coordination:  There is no decremation with RAM's. Normal finger to nose  Gait and Station: The patient has no difficulty arising out of a deep-seated chair without the use of the hands. The patient's stride length is good.  Gait is cautious and narrow.    CBC CBC Latest Ref Rng & Units 05/10/2020 09/30/2019 09/29/2019  WBC 4.0 - 10.5 K/uL 6.5 6.2 5.5  Hemoglobin 13.0 - 17.0 g/dL 14.1 11.4(L) 12.2(L)  Hematocrit 39.0 - 52.0 % 44.7 34.3(L) 36.2(L)  Platelets 150 - 400 K/uL 244 198 190     CMP Latest Ref Rng & Units 10/09/2020 05/31/2020 05/10/2020  Glucose 65 - 99 mg/dL 106(H) - 93  BUN 8 - 27 mg/dL 9 - 7(L)  Creatinine 0.76 - 1.27 mg/dL 1.07 - 1.05  Sodium 134 - 144 mmol/L 138 - 140  Potassium 3.5 - 5.2 mmol/L 4.7 - 3.9  Chloride 96 - 106 mmol/L 101 - 107  CO2 20 - 29 mmol/L 27 - 24  Calcium 8.6 - 10.2 mg/dL 9.4 - 8.9  Total Protein 6.0 - 8.5 g/dL 7.2 7.0 -  Total Bilirubin 0.0 - 1.2 mg/dL 0.7 0.9 -  Alkaline Phos 44 - 121 IU/L 85 89 -  AST 0 - 40 IU/L 17 16 -  ALT 0 - 44 IU/L 16 11 -       Total time spent on today's visit was 45 minutes, including both face-to-face time and nonface-to-face time. Time included that spent on review of records (prior notes available to me/labs/imaging if pertinent), discussing treatment and goals, answering patient's questions and coordinating care.  Cc:  Buzzy Han, MD Sharene Butters, PA-C

## 2021-06-14 ENCOUNTER — Other Ambulatory Visit: Payer: Self-pay | Admitting: Cardiovascular Disease

## 2021-07-01 ENCOUNTER — Ambulatory Visit: Payer: Medicare Other | Admitting: Neurology

## 2021-07-08 ENCOUNTER — Ambulatory Visit: Payer: Medicare Other | Admitting: Neurology

## 2021-08-14 ENCOUNTER — Other Ambulatory Visit: Payer: Self-pay | Admitting: Pulmonary Disease

## 2021-08-14 DIAGNOSIS — J449 Chronic obstructive pulmonary disease, unspecified: Secondary | ICD-10-CM

## 2021-08-25 ENCOUNTER — Telehealth: Payer: Self-pay | Admitting: Pulmonary Disease

## 2021-08-25 DIAGNOSIS — J449 Chronic obstructive pulmonary disease, unspecified: Secondary | ICD-10-CM

## 2021-08-25 NOTE — Telephone Encounter (Signed)
Pt needs to schedule f/u prior to [place] able to refill meds as pt has not been seen in over a year and was told to f/u 3 months from last appt.  Attempted to call pt's wife Abigail Butts but unable to reach. Left message for her to return call.

## 2021-08-28 MED ORDER — FORMOTEROL FUMARATE 20 MCG/2ML IN NEBU: 20.0000 ug | INHALATION_SOLUTION | Freq: Two times a day (BID) | RESPIRATORY_TRACT | 0 refills | Status: DC

## 2021-08-28 NOTE — Telephone Encounter (Signed)
Call made to patient wife Abigail Butts, confirmed patient DOB. Requsting refills of Perforomist. Made aware an appt is needed. Appt made. Refill sent for 30 day supply. Aware no futher refills will be given until appt is made. Voiced understanding. Refill sent to [facility].   Nothing further needed at this time.

## 2021-09-11 ENCOUNTER — Encounter: Payer: Self-pay | Admitting: Pulmonary Disease

## 2021-09-11 ENCOUNTER — Ambulatory Visit (INDEPENDENT_AMBULATORY_CARE_PROVIDER_SITE_OTHER): Payer: Medicare Other | Admitting: Pulmonary Disease

## 2021-09-11 ENCOUNTER — Other Ambulatory Visit: Payer: Self-pay

## 2021-09-11 VITALS — BP 120/80 | HR 77 | Temp 98.7°F | Ht 68.0 in | Wt 191.8 lb

## 2021-09-11 DIAGNOSIS — Z23 Encounter for immunization: Secondary | ICD-10-CM | POA: Diagnosis not present

## 2021-09-11 DIAGNOSIS — J449 Chronic obstructive pulmonary disease, unspecified: Secondary | ICD-10-CM

## 2021-09-11 MED ORDER — FORMOTEROL FUMARATE 20 MCG/2ML IN NEBU: 20.0000 ug | INHALATION_SOLUTION | Freq: Two times a day (BID) | RESPIRATORY_TRACT | 11 refills | Status: AC

## 2021-09-11 MED ORDER — BUDESONIDE 0.25 MG/2ML IN SUSP: 0.2500 mg | Freq: Two times a day (BID) | RESPIRATORY_TRACT | 11 refills | Status: AC

## 2021-09-11 MED ORDER — ALBUTEROL SULFATE (2.5 MG/3ML) 0.083% IN NEBU: 2.5000 mg | INHALATION_SOLUTION | Freq: Four times a day (QID) | RESPIRATORY_TRACT | 11 refills | Status: AC | PRN

## 2021-09-11 MED ORDER — PREDNISONE 20 MG PO TABS: 40.0000 mg | ORAL_TABLET | Freq: Every day | ORAL | 0 refills | Status: AC

## 2021-09-11 NOTE — Progress Notes (Signed)
@Patient  ID: Jose Leonard, male    DOB: [birth year], 70 y.o.   MRN: [id number]  Chief Complaint  Patient presents with   Follow-up    Patient daughter reports that his breathing is not good overall     Referring provider: Buzzy Han*  HPI:  Jose Leonard is a 70 y.o.  man with reported history of COPD (no PFTs available for review) and chronic hypoxic respiratory failure on 2 L oxygen starting 2020 and significant dementia whom are seeing in follow up for evaluation of cough and dyspnea on exertion.  Most recent neurology note reviewed. Most recent cardiology note reviewed.   History exclusively via wife in room.  No real changes last visit.  He has ongoing productive cough.  Often worse in the evenings.  Cough when eats or drinks.  Really coughs throughout the day.  Often with thick phlegm.  Intensity and severity of cough varies throughout the day or week.  No clear inciting event or trigger.  Continues on budesonide arformoterol nebulizers.  Thinks it helps some.  Has some wheezing.  Notes watery eyes, congestion after walking outside.  He continues to be quite active, walking quite a bit throughout the day.  Notably about 8 pound weight loss since last visit.  HPI at initial visit: Patient is unable to provide reliable history.  History largely gathered from wife in the room.  Gradual onset of worsening dyspnea on exertion the last several months to year or 2.  Severity described as moderate.  Worsening as above.  Notices mainly after going for walks he is breathing heavily and needs to rest on the bed for some time before recovering.  Symptoms appear to improve with rest.  Has used albuterol nebulizer and budesonide nebulizer.  No clear improvement with these interventions.  This seems to be more severe than prior.  In addition, he has a daily cough.  Productive of phlegm. Present for years.   Chest x-ray results 10/2019 reviewed and read as normal.  Chest x-ray 2012  reviewed and on my interpretation clear, no evidence of hyperinflation.  PMH: CAD, COPD, Surgical history: CABG Family history - Reviewed, none Social History: 20-30 pack year history, quit 1985, retired, no alcohol Maintenance:   Pt of:   09/11/2021  - Visit     Questionaires / Pulmonary Flowsheets:   ACT:  No flowsheet data found.  MMRC: No flowsheet data found.  Epworth:  No flowsheet data found.  Tests:   FENO:  No results found for: NITRICOXIDE  PFT: No flowsheet data found.  WALK:  No flowsheet data found.  Imaging: Most recent chest imaging 08/2011 reviewed and interpreted as clear lungs bilaterally  Lab Results: Reviewed as per EMR. CBC    Component Value Date/Time   WBC 6.5 05/10/2020 1200   RBC 4.79 05/10/2020 1200   HGB 14.1 05/10/2020 1200   HCT 44.7 05/10/2020 1200   PLT 244 05/10/2020 1200   MCV 93.3 05/10/2020 1200   MCH 29.4 05/10/2020 1200   MCHC 31.5 05/10/2020 1200   RDW 13.2 05/10/2020 1200   LYMPHSABS 2.1 09/28/2019 0152   MONOABS 0.8 09/28/2019 0152   EOSABS 0.3 09/28/2019 0152   BASOSABS 0.1 09/28/2019 0152    BMET    Component Value Date/Time   NA 138 10/09/2020 1103   K 4.7 10/09/2020 1103   CL 101 10/09/2020 1103   CO2 27 10/09/2020 1103   GLUCOSE 106 (H) 10/09/2020 1103   GLUCOSE 93 05/10/2020 1200  BUN 9 10/09/2020 1103   CREATININE 1.07 10/09/2020 1103   CREATININE 0.91 03/12/2016 1103   CALCIUM 9.4 10/09/2020 1103   GFRNONAA 69 10/09/2020 1103   GFRAA 80 10/09/2020 1103    BNP No results found for: BNP  ProBNP    Component Value Date/Time   PROBNP 52.0 02/15/2009 1550    Specialty Problems       Pulmonary Problems   ASTHMATIC BRONCHITIS, ACUTE    Qualifier: Diagnosis of  By: Talbert Cage CMA (AAMA), June        COUGH    Qualifier: Diagnosis of  By: Chase Caller MD, Murali        SHORTNESS OF BREATH (SOB)    Qualifier: Diagnosis of  By: Chase Caller MD, Murali        Pharyngeal dysphagia     Allergies  Allergen Reactions   Tape Other (See Comments)    Skin turns red and draws blood    Penicillins Other (See Comments)    Did it involve swelling of the face/tongue/throat, SOB, or low BP? Unknown Did it involve sudden or severe rash/hives, skin peeling, or any reaction on the inside of your mouth or nose? Unknown Did you need to seek medical attention at a hospital or doctor's office? Unknown When did it last happen?       If all above answers are "NO", may proceed with cephalosporin use.  Childhood allergy    Immunization History  Administered Date(s) Administered   Fluad Quad(high Dose 65+) 07/30/2019, 09/11/2021   Influenza Split 08/17/2016   Influenza, High Dose Seasonal PF 07/07/2017   PFIZER(Purple Top)SARS-COV-2 Vaccination 02/01/2020, 02/26/2020   Zoster Recombinat (Shingrix) 07/30/2019    Past Medical History:  Diagnosis Date   Anxiety state, unspecified    Arthropathy, unspecified, site unspecified    Asthma    BPH (benign prostatic hyperplasia)    CAD (coronary artery disease)    a. 8/20105 CABG x 4 (LIMA->LAD, RIMA->RCA, VG->Diag, VG->OM); b. 11/2008 Cath: patent VG's with atretic LIMA/RIMA. RCA 50; c. 12/2011 MV: EF 62%, no ischemia.   COPD (chronic obstructive pulmonary disease) (HCC)    Dementia (HCC)    Depression    Diverticulosis    Dyspnea    Sats drop when he is not wearing oxygen   Family history of other neurological diseases    Gallstones    GI bleed 09/2019   Hiatal hernia    Hyperlipidemia    Hypertensive heart disease    a. 07/2005 Echo: EF nl, mildly dil LA/RA, mild TR/AI.   Memory loss    Myocardial infarction ([place])    Presyncope and Syncope    a. Syncopal spell @ home 01/2016.   Schatzki's ring    Tubular adenoma of colon     Tobacco History: Social History   Tobacco Use  Smoking Status Former   Years: 30.00   Types: Cigarettes   Quit date: 06/28/1983   Years since quitting: 38.2  Smokeless Tobacco Never    Counseling given: Not Answered   Continue to not smoke  Outpatient Encounter Medications as of 09/11/2021  Medication Sig   acetaminophen (TYLENOL) 500 MG tablet Take 1,000 mg by mouth every 8 (eight) hours as needed for mild pain or headache.   aspirin EC 81 MG tablet Take 1 tablet (81 mg total) by mouth daily.   cyanocobalamin (,VITAMIN B-12,) 1000 MCG/ML injection Inject 1,000 mcg into the muscle every 30 (thirty) days.   divalproex (DEPAKOTE ER) 250 MG 24 hr tablet Take  1 tablet (250 mg total) by mouth daily. Take 1 tablet every night   escitalopram (LEXAPRO) 20 MG tablet Take 1 tablet (20 mg total) by mouth daily.   hydrOXYzine (ATARAX/VISTARIL) 25 MG tablet Take 25 mg by mouth every 8 (eight) hours as needed for anxiety.   Memantine HCl-Donepezil HCl (NAMZARIC) 28-10 MG CP24 Take 1 capsule by mouth daily.   nortriptyline (PAMELOR) 10 MG capsule Take 10 mg by mouth at bedtime as needed for sleep.   predniSONE (DELTASONE) 20 MG tablet Take 2 tablets (40 mg total) by mouth daily with breakfast for 5 days.   QUEtiapine (SEROQUEL) 50 MG tablet Take 50 mg by mouth at bedtime as needed (sleep).    rosuvastatin (CRESTOR) 20 MG tablet TAKE 1 TABLET BY MOUTH EVERY DAY   tamsulosin (FLOMAX) 0.4 MG CAPS capsule Take 0.4 mg by mouth daily.   [DISCONTINUED] albuterol (PROVENTIL) (2.5 MG/3ML) 0.083% nebulizer solution Take 2.5 mg by nebulization every 6 (six) hours as needed for wheezing or shortness of breath.   [DISCONTINUED] budesonide (PULMICORT) 0.25 MG/2ML nebulizer solution USE 1 VIAL IN NEBULIZER EVERY 12 HOURS - Rinse mouth after treatment   [DISCONTINUED] formoterol (PERFOROMIST) 20 MCG/2ML nebulizer solution Take 2 mLs (20 mcg total) by nebulization 2 (two) times daily.   albuterol (PROVENTIL) (2.5 MG/3ML) 0.083% nebulizer solution Take 3 mLs (2.5 mg total) by nebulization every 6 (six) hours as needed for wheezing or shortness of breath.   budesonide (PULMICORT) 0.25 MG/2ML nebulizer  solution Take 2 mLs (0.25 mg total) by nebulization in the morning and at bedtime.   fexofenadine (ALLEGRA ALLERGY) 180 MG tablet Take 1 tablet (180 mg total) by mouth daily for 15 days.   formoterol (PERFOROMIST) 20 MCG/2ML nebulizer solution Take 2 mLs (20 mcg total) by nebulization 2 (two) times daily.   [DISCONTINUED] cetirizine (ZYRTEC) 10 MG tablet Take 10 mg by mouth daily.   No facility-administered encounter medications on file as of 09/11/2021.     Review of Systems  Review of Systems  N/a Physical Exam  BP 120/80 (BP Location: Left Arm, Patient Position: Sitting, Cuff Size: Normal)   Pulse 77   Temp 98.7 F (37.1 C) (Oral)   Ht 5\' 8"  (1.727 m)   Wt 191 lb 12.8 oz (87 kg)   SpO2 97%   BMI 29.16 kg/m   Wt Readings from Last 5 Encounters:  09/11/21 191 lb 12.8 oz (87 kg)  05/29/21 205 lb 6 oz (93.2 kg)  11/27/20 202 lb 9.6 oz (91.9 kg)  10/09/20 197 lb 9.6 oz (89.6 kg)  07/05/20 196 lb 9.6 oz (89.2 kg)    BMI Readings from Last 5 Encounters:  09/11/21 29.16 kg/m  05/29/21 30.33 kg/m  11/27/20 29.92 kg/m  10/09/20 29.18 kg/m  07/05/20 30.79 kg/m     Physical Exam General: Well-appearing, no acute distress Neck: No JVP sitting upright, supple  eyes: EOMI, no icterus Respiratory: Clear to auscultation bilaterally, diminished breath sounds, normal work of breathing Cardiovascular: Regular rate and rhythm, no murmurs Abdomen: Soft nontender, bowel sounds present MSK: No joint effusions, no synovitis Neuro: Normal gait,  no weakness Psych: Disoriented, pleasant, occasionally responds appropriately but often does not respond appropriately to questions   Assessment & Plan:   Dyspnea on exertion: Relatively stable.  Suspect possible asthma as a driver.  To continue nebulized budesonide and arformoterol for ICS/LABA therapy.  Unclear how much benefit he receives.  He remains active which is good.  Cough: High suspicion for recurrent  aspiration.  Possible  sign of uncontrolled asthma.  A lot of congestion, phlegm.  Trial prednisone 40 mg daily x5 days.  If helpful would recommend judicious use in the future.  If not helpful, no need for further trial of prednisone.  Asthma: Presumed, atopic symptoms, cough, mild dyspnea.  Continue ICS/LABA therapy.  Nebulizers refilled today.  Prednisone trial as above.  Return in about 1 year (around 09/11/2022).   Lanier Clam, MD 09/11/2021

## 2021-09-11 NOTE — Patient Instructions (Signed)
Nice to see you  Prednisone 40 mg (2 tabs) in the morning for 5 days - see if this helps with congestion, cough, wheeze.  I refilled the nebulizer solution  Return to clinic in 1 year or sooner as needed

## 2021-09-19 ENCOUNTER — Telehealth: Payer: Self-pay | Admitting: Pulmonary Disease

## 2021-09-19 NOTE — Telephone Encounter (Signed)
Spoke to patient's spouse, Wendy(DPR). Abigail Butts stated that [facility] keeps calling in reference to refills. Abigail Butts stated that patient does not currently need refills on Pulmicort at this time.  When patient is in need of refills, Abigail Butts will contact [facility] for refills. Terri with [facility] is aware and voiced her understanding.  Nothing further needed.

## 2021-11-18 ENCOUNTER — Telehealth: Payer: Self-pay | Admitting: Pulmonary Disease

## 2021-11-18 NOTE — Telephone Encounter (Signed)
Called and spoke with patient's wife Jose Leonard. She stated that the patient was admitted to [facility] yesterday due to his O2 being 80%. They had to place him on 6L of O2 in order to get his O2 back into the 90s. They have been running numerous tests and have not told her any results.   She has requested to speak with a nurse or MD at the hospital but no one has returned her call. She even stated that the last time she asked to speak with a nurse, the person who answered the phone took the phone directly to her husband and he had no clue of what was going on.   She wanted to know if MH could look at the notes from [place].   I attempt to see if I could find any progress notes but I was only able to see test results.   MH, can you please advise? Thanks!

## 2021-11-18 NOTE — Telephone Encounter (Signed)
Called and spoke with patient's wife to let her know of the message from Dr. Murvin Donning. She expressed understanding. Advised her to call us once he gets out of the hospital to set up a hospital follow up. Nothing further needed at this time.

## 2021-11-18 NOTE — Telephone Encounter (Signed)
Called patient's spouse but call went straight to VM. Left message for her to call us back.

## 2021-12-04 ENCOUNTER — Other Ambulatory Visit: Payer: Self-pay

## 2021-12-04 ENCOUNTER — Ambulatory Visit: Payer: Medicare Other | Admitting: Physician Assistant

## 2021-12-04 ENCOUNTER — Encounter: Payer: Self-pay | Admitting: Physician Assistant

## 2021-12-04 VITALS — BP 120/70 | HR 77 | Resp 18 | Ht 67.0 in | Wt 192.0 lb

## 2021-12-04 DIAGNOSIS — G301 Alzheimer's disease with late onset: Secondary | ICD-10-CM

## 2021-12-04 DIAGNOSIS — F02818 Dementia in other diseases classified elsewhere, unspecified severity, with other behavioral disturbance: Secondary | ICD-10-CM

## 2021-12-04 MED ORDER — NAMZARIC 28-10 MG PO CP24: 1.0000 | ORAL_CAPSULE | Freq: Every day | ORAL | 11 refills | Status: AC

## 2021-12-04 NOTE — Progress Notes (Signed)
Assessment/Plan:    Late onset Alzheimer's disease with behavioral disturbance    Recommendations:  Discussed safety both in and out of the home.  Discussed the importance of regular daily schedule.once again, adult day program was discussed versus sitter, as the patient needs 24/7 care for safety  Naps should be scheduled and should be no longer than 60 minutes and should not occur after 2 PM.  Mediterranean diet is recommended  Continue Namzaric daily Side effects were discussed Continue Lexapro 20 mg daily, for mood side effects discussed Continue Depakote ER 250 mh qhs for mood, discussed side effects Follow up in 6  months.   Case discussed with Dr. Delice Lesch who agrees with the plan     Subjective:    ED visits since last seen: Asthma exacerbation 09/11/21,  09/2021  mechanical fall head hitting face, no LOC.  COPD exacerbation 11/17/21 Hospital admissions: none  Jose Leonard is a 71 y.o. male  seen today in follow up for memory loss.  Last visit was on 05/27/2021 at which time MMSE was 12/30..  This patient is accompanied in the office by his wife who supplements the history.  Previous records as well as any outside records available were reviewed prior to todays visit.  Patient is currently on Namzaric 28-10 mg tolerating well.  The patient reports that his memory is good, wife adds that he has minimal insight into his condition, he does repeat the word "fine "very often.  She feels that the memory may be showing a slight decline.  He continues to walk around in the house in circles, and cannot follow directions.  He is not very active outside, in view of the cold weather, so he walks within the parameters of the house.  In November, as mentioned above, he had a mechanical fall hitting his face, no loss of consciousness, but he was evaluated at the emergency department, with negative work-up.  He continues to confuse clothes.  His wife has to lay his garments, otherwise he will  use her garments.  He does not remember to change them.  His wife assisting with taking him to the shower, but he does the rest by himself.  He sleeps well with the help of the medications, without nightmares or sleepwalking.  Denies paranoia or hallucinations.  Occasionally he takes a nap.  His wife drives him during deliveries, but he does not wander off anymore.  She is constantly providing supervision, has a hard time delegating to a sitter to provide care for him while she is not in the house. She manages the medications, finances and meals.  He is eating soft foods, because being edentulous, his wife is afraid that he will choke.  His appetite is good.  He has chronic bladder and bowel incontinence, wears pullups.  He has known right hand tremors, but he is able to stop voluntarily,  not affecting drinking tonic or using utensils.   History on Initial Assessment 01/04/2020: This is a 71 year old right-handed man with a history of hyperlipidemia, anxiety, dementia, presenting to establish care. He feels his memory is "85-90%." He lives with his wife and grandson. They have been living in a motel for the past 6 months.His wife started noticing changes for several years, probably in his early to [place]. She has been administering his medications for several years, for a time he had stopped them. They used to do bills together, but now he cannot do it, he does not know how  to use the credit card or pump gas. He stopped driving 1-2 years ago, he was getting lost. He does not recognize family members such as his cousin. He denies any word-finding difficulties. He can bathe and dress himself, but his wife reports that she has to remind him that he has to use shampoo or he would put the same clothes back on. He has difficulty using his inhaler. 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 He has  had bowel and bladder accidents and wears pull-ups. She has noticed more personality changes, he gets aggravated very easily, he gets mad when told how to do things, cussing a lot. Everything seems to be an argument, he asks what she is doing "a million times a day." He states his mood is okay. His wife reports he does not have an appetite and never wants to eat. He says he is not sleeping well but she watches him sleep and sees that he sleeps fine. No hallucinations but he has paranoia always feeling like they are talking about him. He has been on Namzaric for several years, no side effects. He is on Lexapro 10mg  daily and Seroquel 50mg  qhs. He has not started nortritpyline. He denies any headaches, dizziness, dysarthria/dysphagia, neck/back pain, focal numbness/tingling/weakness, anosmia, or tremors. His father and paternal grandmother had dementia. No history of significant head injuries, no alcohol use.   He underwent Neuropsychological testing at [facility] in July 2020 with note that his pattern of performance indicated prominent memory loss with evidence of rapid forgetting, consistent with Alzheimer's disease. MRI brain in 2014 for memory loss showed moderate anterior and mesial temporal atrophy, small amount of left periventricular and subcortical foci of non-specific gliosis.    PREVIOUS MEDICATIONS:   CURRENT MEDICATIONS:  Outpatient Encounter Medications as of 12/04/2021  Medication Sig   acetaminophen (TYLENOL) 500 MG tablet Take 1,000 mg by mouth every 8 (eight) hours as needed for mild pain or headache.   albuterol (PROVENTIL) (2.5 MG/3ML) 0.083% nebulizer solution Take 3 mLs (2.5 mg total) by nebulization every 6 (six) hours as needed for wheezing or shortness of breath.   aspirin 81 MG chewable tablet Chew by mouth.   aspirin EC 81 MG tablet Take 1 tablet (81 mg total) by mouth daily.   BREO ELLIPTA 100-25 MCG/ACT AEPB 1 puff daily.   budesonide (PULMICORT) 0.25 MG/2ML nebulizer solution  Take 2 mLs (0.25 mg total) by nebulization in the morning and at bedtime.   cyanocobalamin (,VITAMIN B-12,) 1000 MCG/ML injection Inject 1,000 mcg into the muscle every 30 (thirty) days.   divalproex (DEPAKOTE ER) 250 MG 24 hr tablet Take 1 tablet (250 mg total) by mouth daily. Take 1 tablet every night   divalproex (DEPAKOTE) 250 MG DR tablet Take by mouth.   escitalopram (LEXAPRO) 20 MG tablet Take 1 tablet (20 mg total) by mouth daily.   fexofenadine (ALLEGRA ALLERGY) 180 MG tablet Take 1 tablet (180 mg total) by mouth daily for 15 days.   fexofenadine (ALLEGRA) 180 MG tablet Take by mouth.   formoterol (PERFOROMIST) 20 MCG/2ML nebulizer solution Take 2 mLs (20 mcg total) by nebulization 2 (two) times daily.   hydrOXYzine (ATARAX/VISTARIL) 25 MG tablet Take 25 mg by mouth every 8 (eight) hours as needed for anxiety.   Memantine HCl-Donepezil HCl (NAMZARIC) 28-10 MG CP24 Take 1 capsule by mouth daily.  nortriptyline (PAMELOR) 10 MG capsule Take 10 mg by mouth at bedtime as needed for sleep.   QUEtiapine (SEROQUEL) 50 MG tablet Take 50 mg by mouth at bedtime as needed (sleep).    rosuvastatin (CRESTOR) 20 MG tablet TAKE 1 TABLET BY MOUTH EVERY DAY   tamsulosin (FLOMAX) 0.4 MG CAPS capsule Take 0.4 mg by mouth daily.   [DISCONTINUED] cetirizine (ZYRTEC) 10 MG tablet Take 10 mg by mouth daily.   No facility-administered encounter medications on file as of 12/04/2021.     Objective:     PHYSICAL EXAMINATION:    VITALS:   Vitals:   12/04/21 0932  BP: 120/70  Pulse: 77  Resp: 18  SpO2: 95%  Weight: 192 lb (87.1 kg)  Height: 5\' 7"  (1.702 m)     GEN:  The patient appears stated age and is in NAD. HEENT:  Normocephalic, atraumatic.   Neurological examination:  General: NAD, well-groomed, appears stated age. Orientation: The patient is alert. Oriented to person not to place or date Cranial nerves: There is good facial symmetry.The speech is not fluent but clear. No aphasia or  dysarthria. Fund of knowledge is reduced. Recent and remote memory are impaired. Attention and concentration are reduced.  Able to name objects and  unable to repeat phrases.  Hearing is intact to conversational tone.    Sensation: Sensation is intact to light touch throughout Motor: Strength is at least antigravity x4.   No flowsheet data found.   MMSE - Mini Mental State Exam 05/29/2021 11/27/2020  Orientation to time 0 4  Orientation to Place 2 3  Registration 3 3  Attention/ Calculation 0 2  Recall 0 0  Language- name 2 objects 2 2  Language- repeat 0 0  Language- follow 3 step command 3 3  Language- read & follow direction 1 1  Write a sentence 0 1  Copy design 1 0  Total score 12 19      Movement examination: Tone: There is normal tone in the UE/LE Abnormal movements:  R tremor controlled when instructed to stop.  No myoclonus.  No asterixis.   Coordination:  There is no decremation with RAM's. Normal finger to nose  Gait and Station: The patient has no difficulty arising out of a deep-seated chair without the use of the hands. The patient's stride length is good.  Gait is cautious and narrow.       Total time spent on today's visit was 30 minutes, including both face-to-face time and nonface-to-face time. Time included that spent on review of records (prior notes available to me/labs/imaging if pertinent), discussing treatment and goals, answering patient's questions and coordinating care.  Cc:  Buzzy Han, MD Sharene Butters, PA-C

## 2021-12-04 NOTE — Patient Instructions (Signed)
It was a pleasure to see you today at our office.   Recommendations:  Follow up in  6 months Continue Lexapro 20 mg daily Continue Namzaric daily as directed  Continue Depakote ER 250 mg qhs  Consider Sitter to help him interact during the day thanks    RECOMMENDATIONS FOR ALL PATIENTS WITH MEMORY PROBLEMS: 1. Continue to exercise (Recommend 30 minutes of walking everyday, or 3 hours every week) 2. Increase social interactions - continue going to [place] and enjoy social gatherings with friends and family 3. Eat healthy, avoid fried foods and eat more fruits and vegetables 4. Maintain adequate blood pressure, blood sugar, and blood cholesterol level. Reducing the risk of stroke and cardiovascular disease also helps promoting better memory. 5. Avoid stressful situations. Live a simple life and avoid aggravations. Organize your time and prepare for the next day in anticipation. 6. Sleep well, avoid any interruptions of sleep and avoid any distractions in the bedroom that may interfere with adequate sleep quality 7. Avoid sugar, avoid sweets as there is a strong link between excessive sugar intake, diabetes, and cognitive impairment We discussed the Mediterranean diet, which has been shown to help patients reduce the risk of progressive memory disorders and reduces cardiovascular risk. This includes eating fish, eat fruits and green leafy vegetables, nuts like almonds and hazelnuts, walnuts, and also use olive oil. Avoid fast foods and fried foods as much as possible. Avoid sweets and sugar as sugar use has been linked to worsening of memory function.  There is always a concern of gradual progression of memory problems. If this is the case, then we may need to adjust level of care according to patient needs. Support, both to the patient and caregiver, should then be put into place.      You have been referred for a neuropsychological evaluation (i.e., evaluation of memory and thinking  abilities). Please bring someone with you to this appointment if possible, as it is helpful for the doctor to hear from both you and another adult who knows you well. Please bring eyeglasses and hearing aids if you wear them.    The evaluation will take approximately 3 hours and has two parts:   The first part is a clinical interview with the neuropsychologist (Dr. Melvyn Novas or Dr. Nicole Kindred). During the interview, the neuropsychologist will speak with you and the individual you brought to the appointment.    The second part of the evaluation is testing with the doctor's technician Hinton Dyer or Maudie Mercury). During the testing, the technician will ask you to remember different types of material, solve problems, and answer some questionnaires. Your family member will not be present for this portion of the evaluation.   Please note: We must reserve several hours of the neuropsychologist's time and the psychometrician's time for your evaluation appointment. As such, there is a No-Show fee of $100. If you are unable to attend any of your appointments, please contact our office as soon as possible to reschedule.    FALL PRECAUTIONS: Be cautious when walking. Scan the area for obstacles that may increase the risk of trips and falls. When getting up in the mornings, sit up at the edge of the bed for a few minutes before getting out of bed. Consider elevating the bed at the head end to avoid drop of blood pressure when getting up. Walk always in a well-lit room (use night lights in the walls). Avoid area rugs or power cords from appliances in the middle of the walkways.  Use a walker or a cane if necessary and consider physical therapy for balance exercise. Get your eyesight checked regularly.  FINANCIAL OVERSIGHT: Supervision, especially oversight when making financial decisions or transactions is also recommended.  HOME SAFETY: Consider the safety of the kitchen when operating appliances like stoves, microwave oven, and  blender. Consider having supervision and share cooking responsibilities until no longer able to participate in those. Accidents with firearms and other hazards in the house should be identified and addressed as well.   ABILITY TO BE LEFT ALONE: If patient is unable to contact 911 operator, consider using LifeLine, or when the need is there, arrange for someone to stay with patients. Smoking is a fire hazard, consider supervision or cessation. Risk of wandering should be assessed by caregiver and if detected at any point, supervision and safe proof recommendations should be instituted.  MEDICATION SUPERVISION: Inability to self-administer medication needs to be constantly addressed. Implement a mechanism to ensure safe administration of the medications.   DRIVING: Regarding driving, in patients with progressive memory problems, driving will be impaired. We advise to have someone else do the driving if trouble finding directions or if minor accidents are reported. Independent driving assessment is available to determine safety of driving.   If you are interested in the driving assessment, you can contact the following:  [employer] in [place]  [place] [phone] or [phone]    [place] refers to food and lifestyle choices that are based on the traditions of countries located on the [employer]. This way of eating has been shown to help prevent certain conditions and improve outcomes for people who have chronic diseases, like kidney disease and heart disease. What are tips for following this plan? Lifestyle  Cook and eat meals together with your family, when possible. Drink enough fluid to keep your urine clear or pale yellow. Be physically active every day. This includes: Aerobic exercise like running or swimming. Leisure  activities like gardening, walking, or housework. Get 7-8 hours of sleep each night. If recommended by your health care provider, drink red wine in moderation. This means 1 glass a day for nonpregnant women and 2 glasses a day for men. A glass of wine equals 5 oz (150 mL). Reading food labels  Check the serving size of packaged foods. For foods such as rice and pasta, the serving size refers to the amount of cooked product, not dry. Check the total fat in packaged foods. Avoid foods that have saturated fat or trans fats. Check the ingredients list for added sugars, such as corn syrup. Shopping  At the grocery store, buy most of your food from the areas near the walls of the store. This includes: Fresh fruits and vegetables (produce). Grains, beans, nuts, and seeds. Some of these may be available in unpackaged forms or large amounts (in bulk). Fresh seafood. Poultry and eggs. Low-fat dairy products. Buy whole ingredients instead of prepackaged foods. Buy fresh fruits and vegetables in-season from local farmers markets. Buy frozen fruits and vegetables in resealable bags. If you do not have access to quality fresh seafood, buy precooked frozen shrimp or canned fish, such as tuna, salmon, or sardines. Buy small amounts of raw or cooked vegetables, salads, or olives from the deli or salad bar at your store. Stock your pantry so you always have certain foods on hand, such as olive oil, canned tuna, canned tomatoes, rice,  pasta, and beans. Cooking  Cook foods with extra-virgin olive oil instead of using butter or other vegetable oils. Have meat as a side dish, and have vegetables or grains as your main dish. This means having meat in small portions or adding small amounts of meat to foods like pasta or stew. Use beans or vegetables instead of meat in common dishes like chili or lasagna. Experiment with different cooking methods. Try roasting or broiling vegetables instead of steaming or sauteing  them. Add frozen vegetables to soups, stews, pasta, or rice. Add nuts or seeds for added healthy fat at each meal. You can add these to yogurt, salads, or vegetable dishes. Marinate fish or vegetables using olive oil, lemon juice, garlic, and fresh herbs. Meal planning  Plan to eat 1 vegetarian meal one day each week. Try to work up to 2 vegetarian meals, if possible. Eat seafood 2 or more times a week. Have healthy snacks readily available, such as: Vegetable sticks with hummus. Greek yogurt. Fruit and nut trail mix. Eat balanced meals throughout the week. This includes: Fruit: 2-3 servings a day Vegetables: 4-5 servings a day Low-fat dairy: 2 servings a day Fish, poultry, or lean meat: 1 serving a day Beans and legumes: 2 or more servings a week Nuts and seeds: 1-2 servings a day Whole grains: 6-8 servings a day Extra-virgin olive oil: 3-4 servings a day Limit red meat and sweets to only a few servings a month What are my food choices? Mediterranean diet Recommended Grains: Whole-grain pasta. Brown rice. Bulgar wheat. Polenta. Couscous. Whole-wheat bread. [place]. Vegetables: Artichokes. Beets. Broccoli. Cabbage. Carrots. Eggplant. Green beans. Chard. Kale. Spinach. Onions. Leeks. Peas. Squash. Tomatoes. Peppers. Radishes. Fruits: Apples. Apricots. Avocado. Berries. Bananas. Cherries. Dates. Figs. Grapes. Lemons. Melon. Oranges. Peaches. Plums. Pomegranate. Meats and other protein foods: Beans. Almonds. Sunflower seeds. Pine nuts. Peanuts. [place]. Salmon. Scallops. Shrimp. [place]. Tilapia. Clams. Oysters. Eggs. Dairy: Low-fat milk. Cheese. Greek yogurt. Beverages: Water. Red wine. Herbal tea. Fats and oils: Extra virgin olive oil. Avocado oil. Grape seed oil. Sweets and desserts: [place] yogurt with honey. Baked apples. Poached pears. Trail mix. Seasoning and other foods: Basil. Cilantro. Coriander. Cumin. Mint. Parsley. Sage. Rosemary. Tarragon. Garlic. Oregano. Thyme. Pepper.  Balsalmic vinegar. Tahini. Hummus. Tomato sauce. Olives. Mushrooms. Limit these Grains: Prepackaged pasta or rice dishes. Prepackaged cereal with added sugar. Vegetables: Deep fried potatoes (french fries). Fruits: Fruit canned in syrup. Meats and other protein foods: Beef. Pork. Lamb. Poultry with skin. Hot dogs. [place] Salines. Dairy: Ice cream. Sour cream. Whole milk. Beverages: Juice. Sugar-sweetened soft drinks. Beer. Liquor and spirits. Fats and oils: Butter. Canola oil. Vegetable oil. Beef fat (tallow). Lard. Sweets and desserts: Cookies. Cakes. Pies. Candy. Seasoning and other foods: Mayonnaise. Premade sauces and marinades. The items listed may not be a complete list. Talk with your dietitian about what dietary choices are right for you. Summary The Mediterranean diet includes both food and lifestyle choices. Eat a variety of fresh fruits and vegetables, beans, nuts, seeds, and whole grains. Limit the amount of red meat and sweets that you eat. Talk with your health care provider about whether it is safe for you to drink red wine in moderation. This means 1 glass a day for nonpregnant women and 2 glasses a day for men. A glass of wine equals 5 oz (150 mL). This information is not intended to replace advice given to you by your health care provider. Make sure you discuss any questions you have with your health care  provider. Document Released: 07/02/2016 Document Revised: 08/04/2016 Document Reviewed: 07/02/2016 Elsevier Interactive Patient Education  2017 [employer].

## 2021-12-10 ENCOUNTER — Other Ambulatory Visit: Payer: Self-pay

## 2021-12-10 ENCOUNTER — Ambulatory Visit: Payer: Medicare Other | Admitting: Adult Health

## 2021-12-10 ENCOUNTER — Telehealth: Payer: Self-pay | Admitting: Adult Health

## 2021-12-10 ENCOUNTER — Encounter: Payer: Self-pay | Admitting: Adult Health

## 2021-12-10 DIAGNOSIS — J9611 Chronic respiratory failure with hypoxia: Secondary | ICD-10-CM

## 2021-12-10 DIAGNOSIS — J206 Acute bronchitis due to rhinovirus: Secondary | ICD-10-CM

## 2021-12-10 MED ORDER — PREDNISONE 10 MG PO TABS: 20.0000 mg | ORAL_TABLET | Freq: Every day | ORAL | 0 refills | Status: AC

## 2021-12-10 NOTE — Progress Notes (Signed)
@Patient  ID: Jose Leonard, male    DOB: [birth year], 71 y.o.   MRN: [id number]  Chief Complaint  Patient presents with   Acute Visit    Referring provider: Buzzy Han*  HPI: 71 year old male former smoker followed for COPD with asthma and chronic respiratory failure on oxygen.  Oxygen was started in 2020. Medical history significant for severe dementia.,  Coronary artery disease  TEST/EVENTS :  CT chest November 18, 2021 negative for PE no acute process noted CT chest November 18, 2021 negative for PE.  No acute process noted   12/10/2021 Follow up : COPD w/ Asthma , O2 RF  Patient returns for a follow-up visit.  Was recently admitted at outlying hospital end of December for COPD exacerbation.  He was treated with nebulized bronchodilators and steroids.  Discharged on a Medrol Dosepak.  And continued on oxygen at 2 L.  Patient was positive for rhinovirus.  CT chest was negative for PE.  Patient is accompanied by his wife who is his caregiver.  Most of the information is obtained from her.  Patient is very pleasant but has advanced dementia.  Since discharge she says he is doing better but continues to have ongoing cough and wheezing.  Patient says he feels good however does have some wheezing on exam.  Patient is eating well.  No nausea vomiting or diarrhea.  He remains on budesonide and Perforomist nebulizers twice daily.  Is on Oxygen at 2 L.  Patient has been referred to palliative care, home consult is pending.       Allergies  Allergen Reactions   Tape Other (See Comments)    Skin turns red and draws blood    Wound Dressing Adhesive Rash   Penicillins Other (See Comments)    Did it involve swelling of the face/tongue/throat, SOB, or low BP? Unknown Did it involve sudden or severe rash/hives, skin peeling, or any reaction on the inside of your mouth or nose? Unknown Did you need to seek medical attention at a hospital or doctor's office? Unknown When did it last  happen?       If all above answers are "NO", may proceed with cephalosporin use.  Childhood allergy    Immunization History  Administered Date(s) Administered   Fluad Quad(high Dose 65+) 07/30/2019, 09/11/2021   Influenza Split 08/17/2016   Influenza, High Dose Seasonal PF 07/07/2017   PFIZER(Purple Top)SARS-COV-2 Vaccination 02/01/2020, 02/26/2020   Zoster Recombinat (Shingrix) 07/30/2019    Past Medical History:  Diagnosis Date   Anxiety state, unspecified    Arthropathy, unspecified, site unspecified    Asthma    BPH (benign prostatic hyperplasia)    CAD (coronary artery disease)    a. 8/20105 CABG x 4 (LIMA->LAD, RIMA->RCA, VG->Diag, VG->OM); b. 11/2008 Cath: patent VG's with atretic LIMA/RIMA. RCA 50; c. 12/2011 MV: EF 62%, no ischemia.   COPD (chronic obstructive pulmonary disease) (HCC)    Dementia (HCC)    Depression    Diverticulosis    Dyspnea    Sats drop when he is not wearing oxygen   Family history of other neurological diseases    Gallstones    GI bleed 09/2019   Hiatal hernia    Hyperlipidemia    Hypertensive heart disease    a. 07/2005 Echo: EF nl, mildly dil LA/RA, mild TR/AI.   Memory loss    Myocardial infarction ([facility])    Presyncope and Syncope    a. Syncopal spell @ home 01/2016.   Schatzki's ring  Tubular adenoma of colon     Tobacco History: Social History   Tobacco Use  Smoking Status Former   Years: 30.00   Types: Cigarettes   Quit date: 06/28/1983   Years since quitting: 38.4  Smokeless Tobacco Never   Counseling given: Not Answered   Outpatient Medications Prior to Visit  Medication Sig Dispense Refill   acetaminophen (TYLENOL) 500 MG tablet Take 1,000 mg by mouth every 8 (eight) hours as needed for mild pain or headache.     albuterol (PROVENTIL) (2.5 MG/3ML) 0.083% nebulizer solution Take 3 mLs (2.5 mg total) by nebulization every 6 (six) hours as needed for wheezing or shortness of breath. 90 mL 11   aspirin 81 MG chewable tablet  Chew by mouth.     aspirin EC 81 MG tablet Take 1 tablet (81 mg total) by mouth daily.     budesonide (PULMICORT) 0.25 MG/2ML nebulizer solution Take 2 mLs (0.25 mg total) by nebulization in the morning and at bedtime. 120 mL 11   cyanocobalamin (,VITAMIN B-12,) 1000 MCG/ML injection Inject 1,000 mcg into the muscle every 30 (thirty) days.     divalproex (DEPAKOTE ER) 250 MG 24 hr tablet Take 1 tablet (250 mg total) by mouth daily. Take 1 tablet every night 30 tablet 11   divalproex (DEPAKOTE) 250 MG DR tablet Take by mouth.     escitalopram (LEXAPRO) 20 MG tablet Take 1 tablet (20 mg total) by mouth daily. 90 tablet 3   fexofenadine (ALLEGRA) 180 MG tablet Take by mouth.     formoterol (PERFOROMIST) 20 MCG/2ML nebulizer solution Take 2 mLs (20 mcg total) by nebulization 2 (two) times daily. 120 mL 11   hydrOXYzine (ATARAX/VISTARIL) 25 MG tablet Take 25 mg by mouth every 8 (eight) hours as needed for anxiety.     ipratropium-albuterol (DUONEB) 0.5-2.5 (3) MG/3ML SOLN Take 3 mLs by nebulization every 6 (six) hours as needed.     Memantine HCl-Donepezil HCl (NAMZARIC) 28-10 MG CP24 Take 1 capsule by mouth daily. 30 capsule 11   nortriptyline (PAMELOR) 10 MG capsule Take 10 mg by mouth at bedtime as needed for sleep.     QUEtiapine (SEROQUEL) 50 MG tablet Take 50 mg by mouth at bedtime as needed (sleep).      rosuvastatin (CRESTOR) 20 MG tablet TAKE 1 TABLET BY MOUTH EVERY DAY 90 tablet 2   tamsulosin (FLOMAX) 0.4 MG CAPS capsule Take 0.4 mg by mouth daily.     BREO ELLIPTA 100-25 MCG/ACT AEPB 1 puff daily.     fexofenadine (ALLEGRA ALLERGY) 180 MG tablet Take 1 tablet (180 mg total) by mouth daily for 15 days. 15 tablet 0   No facility-administered medications prior to visit.     Review of Systems:   Constitutional:   No  weight loss, night sweats,  Fevers, chills,  +fatigue, or  lassitude.  HEENT:   No headaches,  Difficulty swallowing,  Tooth/dental problems, or  Sore throat,                 No sneezing, itching, ear ache, nasal congestion, post nasal drip,   CV:  No chest pain,  Orthopnea, PND, swelling in lower extremities, anasarca, dizziness, palpitations, syncope.   GI  No heartburn, indigestion, abdominal pain, nausea, vomiting, diarrhea, change in bowel habits, loss of appetite, bloody stools.   Resp: .  No chest wall deformity  Skin: no rash or lesions.  GU: no dysuria, change in color of urine, no urgency or frequency.  No flank pain, no hematuria   MS:  No joint pain or swelling.  No decreased range of motion.  No back pain.    Physical Exam  BP 118/74    Pulse 77    Ht 5\' 7"  (1.702 m)    Wt 194 lb (88 kg)    SpO2 97% Comment: on 2L   BMI 30.38 kg/m   GEN: A/Ox3; pleasant , NAD, well nourished, pleasant   HEENT:  [place]/AT,  EACs-clear, TMs-wnl, NOSE-clear, THROAT-clear, no lesions, no postnasal drip or exudate noted.   NECK:  Supple w/ fair ROM; no JVD; normal carotid impulses w/o bruits; no thyromegaly or nodules palpated; no lymphadenopathy.    RESP few trace expiratory wheezes and rhonchi no accessory use no dullness to percussion  CARD:  RRR, no m/r/g, no peripheral edema, pulses intact, no cyanosis or clubbing.  GI:   Soft & nt; nml bowel sounds; no organomegaly or masses detected.   Musco: Warm bil, no deformities or joint swelling noted.   Neuro: alert, no focal deficits noted.    Skin: Warm, no lesions or rashes    Lab Results:  CBC  BMET   BNP No results found for: BNP  ProBNP   Imaging: No results found.    No flowsheet data found.  No results found for: NITRICOXIDE      Assessment & Plan:   No problem-specific Assessment & Plan notes found for this encounter.     Rexene Edison, NP 12/10/2021

## 2021-12-10 NOTE — Telephone Encounter (Signed)
Called and spoke with pt's spouse Jose Leonard who stated she was told by the pharmacy that they did not have 20mg  prednisone that all they had was 10mg . She stated that she tried to tell the tech at the window of the pharmacy that the 10mg  Rx would be fine and that pt would just take two pills to make up the 20mg  and do that for 5 days but the tech was not understanding what she was saying.  She said when she got home, she called the pharmacy back and spoke with a different person who stated that they were going to be able to get the Rx ready for pt and she will be back by pharmacy later today to pick Rx up for pt. Nothing further needed.

## 2021-12-10 NOTE — Patient Instructions (Addendum)
Continue on Budesonide and Perforomist Neb Twice daily   Continue on Duoneb every 6hr as needed.  Continue on Oxygen 2l/m .  Mucinex DM Twice daily  As needed  cough/congestion Prednisone 20mg  daily for 5 days -take with food.  Palliative care as discussed (Referral as planned)  Follow up with Dr. Silas Flood in 2-3 months and As needed   Please contact office for sooner follow up if symptoms do not improve or worsen or seek emergency care

## 2021-12-12 DIAGNOSIS — J9611 Chronic respiratory failure with hypoxia: Secondary | ICD-10-CM | POA: Insufficient documentation

## 2021-12-12 NOTE — Assessment & Plan Note (Signed)
Slow to resolve COPD/asthmatic bronchitic exacerbation.  Patient will be given a short prednisone burst with prednisone 20 mg for the next 5 days.  Continue on aggressive maintenance regimen.  Plan  Patient Instructions  Continue on Budesonide and Perforomist Neb Twice daily   Continue on Duoneb every 6hr as needed.  Continue on Oxygen 2l/m .  Mucinex DM Twice daily  As needed  cough/congestion Prednisone 20mg  daily for 5 days -take with food.  Palliative care as discussed (Referral as planned)  Follow up with Dr. Silas Flood in 2-3 months and As needed   Please contact office for sooner follow up if symptoms do not improve or worsen or seek emergency care

## 2021-12-12 NOTE — Assessment & Plan Note (Signed)
Continue on oxygen 2 L 

## 2022-03-19 ENCOUNTER — Telehealth: Payer: Self-pay | Admitting: *Deleted

## 2022-03-19 NOTE — Telephone Encounter (Signed)
-----   Message from Annabell Sabal sent at 03/16/2022  3:06 PM EDT ----- ?Regarding: Patient Deceased ?Patient's wife called to inform the patient is deceased. She requested a message be sent to you to let you know. An email has already been sent tot HIM to mark his chart as deceased. ? ?

## 2022-03-19 NOTE — Telephone Encounter (Signed)
Returned a call to the patient's wife expressed condolences from myself and Dr Claiborne Billings. She thanked me for calling her and asked for me to tell Dr Claiborne Billings "thank you" for taking care of her husband. ?

## 2022-03-23 DEATH — deceased

## 2022-06-03 ENCOUNTER — Ambulatory Visit: Payer: Medicare Other | Admitting: Physician Assistant
